# Patient Record
Sex: Male | Born: 1940
Health system: Southern US, Community
[De-identification: ages and names within clinical notes are randomized; demographics above are authoritative.]

## PROBLEM LIST (undated history)

## (undated) DIAGNOSIS — I5032 Chronic diastolic (congestive) heart failure: Secondary | ICD-10-CM

## (undated) DIAGNOSIS — F32A Depression, unspecified: Secondary | ICD-10-CM

## (undated) DIAGNOSIS — F329 Major depressive disorder, single episode, unspecified: Secondary | ICD-10-CM

## (undated) DIAGNOSIS — K219 Gastro-esophageal reflux disease without esophagitis: Secondary | ICD-10-CM

## (undated) DIAGNOSIS — G4730 Sleep apnea, unspecified: Secondary | ICD-10-CM

## (undated) DIAGNOSIS — D72828 Other elevated white blood cell count: Secondary | ICD-10-CM

## (undated) DIAGNOSIS — N400 Enlarged prostate without lower urinary tract symptoms: Secondary | ICD-10-CM

## (undated) DIAGNOSIS — M109 Gout, unspecified: Secondary | ICD-10-CM

## (undated) DIAGNOSIS — D729 Disorder of white blood cells, unspecified: Secondary | ICD-10-CM

## (undated) DIAGNOSIS — N183 Chronic kidney disease, stage 3 unspecified: Secondary | ICD-10-CM

## (undated) DIAGNOSIS — I10 Essential (primary) hypertension: Secondary | ICD-10-CM

## (undated) DIAGNOSIS — G459 Transient cerebral ischemic attack, unspecified: Secondary | ICD-10-CM

## (undated) DIAGNOSIS — D649 Anemia, unspecified: Secondary | ICD-10-CM

## (undated) DIAGNOSIS — E119 Type 2 diabetes mellitus without complications: Secondary | ICD-10-CM

## (undated) DIAGNOSIS — G629 Polyneuropathy, unspecified: Secondary | ICD-10-CM

## (undated) DIAGNOSIS — N200 Calculus of kidney: Secondary | ICD-10-CM

## (undated) DIAGNOSIS — R059 Cough, unspecified: Secondary | ICD-10-CM

## (undated) DIAGNOSIS — F0390 Unspecified dementia without behavioral disturbance: Secondary | ICD-10-CM

## (undated) DIAGNOSIS — R05 Cough: Secondary | ICD-10-CM

## (undated) DIAGNOSIS — M1990 Unspecified osteoarthritis, unspecified site: Secondary | ICD-10-CM

## (undated) DIAGNOSIS — E785 Hyperlipidemia, unspecified: Secondary | ICD-10-CM

## (undated) HISTORY — DX: Other elevated white blood cell count: D72.828

## (undated) HISTORY — DX: Anemia, unspecified: D64.9

## (undated) HISTORY — DX: Disorder of white blood cells, unspecified: D72.9

## (undated) HISTORY — DX: Cough, unspecified: R05.9

## (undated) HISTORY — PX: CATARACT EXTRACTION: SUR2

## (undated) HISTORY — DX: Type 2 diabetes mellitus without complications: E11.9

## (undated) HISTORY — PX: VESICOSTOMY: SHX2661

## (undated) HISTORY — DX: Essential (primary) hypertension: I10

## (undated) HISTORY — DX: Calculus of kidney: N20.0

## (undated) HISTORY — DX: Hyperlipidemia, unspecified: E78.5

## (undated) HISTORY — PX: TONSILLECTOMY: SUR1361

## (undated) HISTORY — PX: SHOULDER SURGERY: SHX246

## (undated) HISTORY — DX: Cough: R05

## (undated) HISTORY — PX: ADENOIDECTOMY: SUR15

---
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
## 2006-07-01 ENCOUNTER — Ambulatory Visit: Payer: Self-pay | Admitting: Internal Medicine

## 2006-07-13 ENCOUNTER — Other Ambulatory Visit: Payer: Self-pay

## 2006-07-16 ENCOUNTER — Ambulatory Visit: Payer: Self-pay | Admitting: Orthopaedic Surgery

## 2006-12-08 ENCOUNTER — Ambulatory Visit: Payer: Self-pay | Admitting: Orthopaedic Surgery

## 2011-02-03 LAB — PULMONARY FUNCTION TEST

## 2011-04-17 ENCOUNTER — Encounter: Payer: Self-pay | Admitting: Critical Care Medicine

## 2011-04-20 ENCOUNTER — Other Ambulatory Visit: Payer: Medicare Other

## 2011-04-20 ENCOUNTER — Ambulatory Visit (INDEPENDENT_AMBULATORY_CARE_PROVIDER_SITE_OTHER): Payer: Medicare Other | Admitting: Critical Care Medicine

## 2011-04-20 ENCOUNTER — Encounter: Payer: Self-pay | Admitting: Critical Care Medicine

## 2011-04-20 DIAGNOSIS — E119 Type 2 diabetes mellitus without complications: Secondary | ICD-10-CM | POA: Insufficient documentation

## 2011-04-20 DIAGNOSIS — R05 Cough: Secondary | ICD-10-CM

## 2011-04-20 DIAGNOSIS — I10 Essential (primary) hypertension: Secondary | ICD-10-CM | POA: Insufficient documentation

## 2011-04-20 DIAGNOSIS — D649 Anemia, unspecified: Secondary | ICD-10-CM | POA: Insufficient documentation

## 2011-04-20 DIAGNOSIS — R0989 Other specified symptoms and signs involving the circulatory and respiratory systems: Secondary | ICD-10-CM

## 2011-04-20 DIAGNOSIS — R0600 Dyspnea, unspecified: Secondary | ICD-10-CM

## 2011-04-20 DIAGNOSIS — E785 Hyperlipidemia, unspecified: Secondary | ICD-10-CM | POA: Insufficient documentation

## 2011-04-20 DIAGNOSIS — J45909 Unspecified asthma, uncomplicated: Secondary | ICD-10-CM | POA: Insufficient documentation

## 2011-04-20 MED ORDER — CICLESONIDE 160 MCG/ACT IN AERS: 1.0000 | INHALATION_SPRAY | Freq: Two times a day (BID) | RESPIRATORY_TRACT | Status: DC

## 2011-04-20 NOTE — Progress Notes (Signed)
Subjective:    Patient ID: Michael Wright, male    DOB: 01-04-1941, 70 y.o.   MRN: [id number]  Cough This is a chronic problem. The current episode started more than 1 year ago (worse over past three months ). The problem has been gradually improving (? if the inhaler is helping, had for one week). The problem occurs every few hours. The cough is non-productive. Associated symptoms include postnasal drip and shortness of breath. Pertinent negatives include no chest pain, chills, ear pain, fever, headaches, heartburn, hemoptysis, myalgias, rash, rhinorrhea, sore throat or wheezing. Associated symptoms comments: Pt was dyspneic, only with exertion, this also is better with the inhaler. The symptoms are aggravated by lying down and exercise. Risk factors for lung disease include occupational exposure (retired from [employer] since 2009,  exposed to paint and dust and shop work). He has tried a beta-agonist inhaler and steroid inhaler for the symptoms. The treatment provided moderate relief. His past medical history is significant for asthma. There is no history of bronchiectasis, bronchitis, COPD, emphysema, environmental allergies or pneumonia.    70 y.o. WM  Referred to eval cough and dyspnea ?asthma  Past Medical History  Diagnosis Date  . Hyperlipidemia   . Anemia   . Hypertension   . Diabetes mellitus, type 2   . Kidney stones   . Cough      Family History  Problem Relation Age of Onset  . Allergies Daughter      History   Social History  . Marital Status: Married    Spouse Name: married    Number of Children: 3  . Years of Education: N/A   Occupational History  . works in a shop at [employer]  . Smoking status: Never Smoker   . Smokeless tobacco: Former Systems developer    Types: [place] date: 08/31/1978  . Alcohol Use: No  . Drug Use: No  . Sexually Active: Not on file   Other Topics Concern  . Not on file   Social History  Narrative  . No narrative on file     No Known Allergies   Outpatient Prescriptions Prior to Visit  Medication Sig Dispense Refill  . allopurinol (ZYLOPRIM) 100 MG tablet Take 100 mg by mouth daily.        . colchicine 0.6 MG tablet Take 0.6 mg by mouth 2 (two) times daily as needed.        Marland Kitchen glimepiride (AMARYL) 1 MG tablet Take 1 mg by mouth 2 (two) times daily.        . metFORMIN (GLUCOPHAGE) 500 MG tablet Take 500 mg by mouth 2 (two) times daily.        Marland Kitchen olmesartan-hydrochlorothiazide (BENICAR HCT) 40-25 MG per tablet Take 1 tablet by mouth daily.        . pioglitazone-metformin (ACTOPLUS MET) 15-850 MG per tablet Take 1 tablet by mouth daily.        . budesonide-formoterol (SYMBICORT) 160-4.5 MCG/ACT inhaler Inhale 2 puffs into the lungs at bedtime.       . indomethacin (INDOCIN) 25 MG capsule Take 25 mg by mouth as directed.        . traMADol (ULTRAM) 50 MG tablet Take 50 mg by mouth as directed.            Review of Systems  Constitutional: Positive for fatigue. Negative for fever, chills, diaphoresis, activity change, appetite change and unexpected weight change.  HENT:  Positive for voice change and postnasal drip. Negative for hearing loss, ear pain, nosebleeds, congestion, sore throat, facial swelling, rhinorrhea, sneezing, mouth sores, trouble swallowing, neck pain, neck stiffness, dental problem, sinus pressure, tinnitus and ear discharge.   Eyes: Positive for discharge. Negative for photophobia, itching and visual disturbance.  Respiratory: Positive for cough and shortness of breath. Negative for apnea, hemoptysis, choking, chest tightness, wheezing and stridor.   Cardiovascular: Negative for chest pain, palpitations and leg swelling.  Gastrointestinal: Negative for heartburn, nausea, vomiting, abdominal pain, constipation, blood in stool and abdominal distention.  Genitourinary: Negative for dysuria, urgency, frequency, hematuria, flank pain, decreased urine volume and  difficulty urinating.  Musculoskeletal: Negative for myalgias, back pain, joint swelling, arthralgias and gait problem.  Skin: Negative for color change, pallor and rash.  Neurological: Negative for dizziness, tremors, seizures, syncope, speech difficulty, weakness, light-headedness, numbness and headaches.  Hematological: Negative for environmental allergies and adenopathy. Does not bruise/bleed easily.  Psychiatric/Behavioral: Negative for confusion, sleep disturbance and agitation. The patient is not nervous/anxious.        Objective:   Physical Exam  Filed Vitals:   04/20/11 1607  BP: 120/70  Pulse: 94  Temp: 97.9 F (36.6 C)  TempSrc: Oral  Height: 5\' 7"  (1.702 m)  Weight: 244 lb 6.4 oz (110.859 kg)  SpO2: 95%    Gen: Pleasant, well-nourished, in no distress,  normal affect  ENT: No lesions,  mouth clear,  oropharynx clear, no postnasal drip  Neck: No JVD, no TMG, no carotid bruits  Lungs: No use of accessory muscles, no dullness to percussion,distant BS  Cardiovascular: RRR, heart sounds normal, no murmur or gallops, no peripheral edema  Abdomen: soft and NT, no HSM,  BS normal  Musculoskeletal: No deformities, no cyanosis or clubbing  Neuro: alert, non focal  Skin: Warm, no lesions or rashes       Assessment & Plan:   Cough Cyclical cough suspect is d/t lower airway inflammation.  Doubt GERD mediated, suspect lower airways disease Note pulmonary functions from prior pulmonary eval with normal spirometry and lung volumes Fev1 86% TLC 80%  DLCO 72% 02/03/11 CXR 02/08/11: NAD  Plan When current symbicort runs out stop and then start Alvesco one puff twice daily Stop astepro No other medication changes Return 2 months  Labs today An overnight sleep oximetry will be obtained    Check IgE and full allergy profile   Updated Medication List Outpatient Encounter Prescriptions as of 04/20/2011  Medication Sig Dispense Refill  . allopurinol (ZYLOPRIM) 100  MG tablet Take 100 mg by mouth daily.        Marland Kitchen aspirin 81 MG tablet Take 81 mg by mouth daily.        . Azelastine HCl (ASTEPRO) 0.15 % SOLN Place 2 puffs into the nose 1 day or 1 dose.        . ciclesonide (ALVESCO) 160 MCG/ACT inhaler Inhale 1 puff into the lungs 2 (two) times daily.  1 Inhaler  6  . colchicine 0.6 MG tablet Take 0.6 mg by mouth 2 (two) times daily as needed.        Marland Kitchen glimepiride (AMARYL) 1 MG tablet Take 1 mg by mouth 2 (two) times daily.        . metFORMIN (GLUCOPHAGE) 500 MG tablet Take 500 mg by mouth 2 (two) times daily.        . mometasone (NASONEX) 50 MCG/ACT nasal spray Place 2 sprays into the nose at bedtime.        Marland Kitchen  olmesartan-hydrochlorothiazide (BENICAR HCT) 40-25 MG per tablet Take 1 tablet by mouth daily.        . pioglitazone-metformin (ACTOPLUS MET) 15-850 MG per tablet Take 1 tablet by mouth daily.        . pravastatin (PRAVACHOL) 40 MG tablet Take 1 tablet by mouth Daily.      Marland Kitchen DISCONTD: budesonide-formoterol (SYMBICORT) 160-4.5 MCG/ACT inhaler Inhale 2 puffs into the lungs at bedtime.       Marland Kitchen DISCONTD: indomethacin (INDOCIN) 25 MG capsule Take 25 mg by mouth as directed.        Marland Kitchen DISCONTD: traMADol (ULTRAM) 50 MG tablet Take 50 mg by mouth as directed.

## 2011-04-20 NOTE — Patient Instructions (Addendum)
When current symbicort runs out stop and then start Alvesco one puff twice daily Stop astepro No other medication changes Return 2 months  Labs today An overnight sleep oximetry will be obtained

## 2011-04-21 LAB — ALLERGY FULL PROFILE
Allergen, D pternoyssinus,d7: 10.5 kU/L — ABNORMAL HIGH (ref <0.35)
Aspergillus fumigatus, IgG: <0.1 kU/L (ref <0.35)
Bermuda Grass: <0.1 kU/L (ref <0.35)
Box Elder IgE: <0.1 kU/L (ref <0.35)
Candida Albicans: 0.35 kU/L — ABNORMAL HIGH (ref <0.35)
D. farinae: 11 kU/L — ABNORMAL HIGH (ref <0.35)
Elm IgE: <0.1 kU/L (ref <0.35)
G005 Rye, Perennial: <0.1 kU/L (ref <0.35)
G009 Red Top: <0.1 kU/L (ref <0.35)
IgE (Immunoglobulin E), Serum: 113.1 IU/mL (ref 0.0–180.0)
Oak: <0.1 kU/L (ref <0.35)
Plantain: <0.1 kU/L (ref <0.35)
Stemphylium Botryosum: <0.1 kU/L (ref <0.35)
Timothy Grass: <0.1 kU/L (ref <0.35)

## 2011-04-21 NOTE — Assessment & Plan Note (Signed)
Cyclical cough suspect is d/t lower airway inflammation.  Doubt GERD mediated, suspect lower airways disease Note pulmonary functions from prior pulmonary eval with normal spirometry and lung volumes Fev1 86% TLC 80%  DLCO 72% 02/03/11 CXR 02/08/11: NAD  Plan When current symbicort runs out stop and then start Alvesco one puff twice daily Stop astepro No other medication changes Return 2 months  Labs today An overnight sleep oximetry will be obtained

## 2011-04-22 ENCOUNTER — Encounter: Payer: Self-pay | Admitting: Critical Care Medicine

## 2011-04-24 NOTE — Progress Notes (Signed)
Quick Note:  Called, spoke with pt. He is aware allergy test pos for cat dander, dog dander, house dust, and ragweed per PW. He is aware no change to meds. He verbalized understanding of these results and recs and voiced no further questions/concerns at this time. ______

## 2011-04-29 ENCOUNTER — Ambulatory Visit: Payer: Self-pay | Admitting: Family Medicine

## 2011-04-30 ENCOUNTER — Telehealth: Payer: Self-pay | Admitting: Critical Care Medicine

## 2011-04-30 DIAGNOSIS — J441 Chronic obstructive pulmonary disease with (acute) exacerbation: Secondary | ICD-10-CM

## 2011-04-30 NOTE — Telephone Encounter (Signed)
ONO shows desats to 61% .  Pt will need oxygen 2L [facility] only Call pt and tell him this and order oxygen

## 2011-04-30 NOTE — Telephone Encounter (Signed)
Order faxed to [facility] for pt to start home oxygen at 2 lpm at qhs only. Lecretia p/u original order. [facility] to contact patient and deliver o2 today. [place] and spoke with pt's wife, (pt was not home at the time) gave results of ono to pt's wife and explained that [facility] would be getting in contact with them to arrange delivery of oxygen to be used at bedtime only at 2 lpm. Advised that if patient had any questions, to contact me at [phone].

## 2011-06-30 ENCOUNTER — Encounter: Payer: Self-pay | Admitting: Critical Care Medicine

## 2011-06-30 ENCOUNTER — Ambulatory Visit (INDEPENDENT_AMBULATORY_CARE_PROVIDER_SITE_OTHER): Payer: Medicare Other | Admitting: Critical Care Medicine

## 2011-06-30 VITALS — BP 106/58 | HR 106 | Temp 98.1°F | Ht 67.5 in | Wt 244.6 lb

## 2011-06-30 DIAGNOSIS — R05 Cough: Secondary | ICD-10-CM

## 2011-06-30 DIAGNOSIS — Z23 Encounter for immunization: Secondary | ICD-10-CM

## 2011-06-30 NOTE — Patient Instructions (Signed)
Follow a reflux diet Stay on Alvesco two puff daily Stay on oxygen at night Flu vaccine and pneumovax was given Return 4 months

## 2011-06-30 NOTE — Progress Notes (Signed)
Subjective:    Patient ID: Michael Wright, male    DOB: Nov 06, 1940, 70 y.o.   MRN: [id number]  Cough This is a chronic problem. The current episode started more than 1 year ago (worse over past three months ). The problem has been gradually improving (? if the inhaler is helping, had for one week). The problem occurs every few hours. The cough is non-productive. Associated symptoms include postnasal drip and shortness of breath. Pertinent negatives include no chest pain, chills, ear pain, fever, headaches, heartburn, hemoptysis, myalgias, rash, rhinorrhea, sore throat or wheezing. Associated symptoms comments: Pt was dyspneic, only with exertion, this also is better with the inhaler. The symptoms are aggravated by lying down and exercise. Risk factors for lung disease include occupational exposure (retired from [employer] since 2009,  exposed to paint and dust and shop work). He has tried a beta-agonist inhaler and steroid inhaler for the symptoms. The treatment provided moderate relief. His past medical history is significant for asthma. There is no history of bronchiectasis, bronchitis, COPD, emphysema, environmental allergies or pneumonia.    70 y.o. WM  Referred to eval cough and dyspnea ?asthma  10/30 Does not sleep well,  Wakes up drowsy.  Not forgetful.  ? If snores.  Desat on ONO to 61% Never had a sleep study.   Has nocturia Cough is better now  Past Medical History  Diagnosis Date  . Hyperlipidemia   . Anemia   . Hypertension   . Diabetes mellitus, type 2   . Kidney stones   . Cough      Family History  Problem Relation Age of Onset  . Allergies Daughter      History   Social History  . Marital Status: Married    Spouse Name: married    Number of Children: 3  . Years of Education: N/A   Occupational History  . works in a shop at [employer]  . Smoking status: Never Smoker   . Smokeless tobacco: Former Systems developer    Types: [place] date: 08/31/1978  . Alcohol Use: No  . Drug Use: No  . Sexually Active: Not on file   Other Topics Concern  . Not on file   Social History Narrative  . No narrative on file     No Known Allergies   Outpatient Prescriptions Prior to Visit  Medication Sig Dispense Refill  . allopurinol (ZYLOPRIM) 100 MG tablet Take 100 mg by mouth daily.        Marland Kitchen aspirin 81 MG tablet Take 81 mg by mouth daily.        . Azelastine HCl (ASTEPRO) 0.15 % SOLN Place 2 puffs into the nose 1 day or 1 dose.        . colchicine 0.6 MG tablet Take 0.6 mg by mouth 2 (two) times daily as needed.        Marland Kitchen glimepiride (AMARYL) 1 MG tablet Take 1 mg by mouth 2 (two) times daily.        . metFORMIN (GLUCOPHAGE) 500 MG tablet Take 500 mg by mouth 2 (two) times daily.        . mometasone (NASONEX) 50 MCG/ACT nasal spray Place 2 sprays into the nose at bedtime.        Marland Kitchen olmesartan-hydrochlorothiazide (BENICAR HCT) 40-25 MG per tablet Take 1 tablet by mouth daily.        . pioglitazone-metformin (ACTOPLUS MET) 15-850 MG per  tablet Take 1 tablet by mouth daily.        . pravastatin (PRAVACHOL) 40 MG tablet Take 1 tablet by mouth Daily.      . ciclesonide (ALVESCO) 160 MCG/ACT inhaler Inhale 1 puff into the lungs 2 (two) times daily.  1 Inhaler  6      Review of Systems  Constitutional: Positive for fatigue. Negative for fever, chills, diaphoresis, activity change, appetite change and unexpected weight change.  HENT: Positive for voice change and postnasal drip. Negative for hearing loss, ear pain, nosebleeds, congestion, sore throat, facial swelling, rhinorrhea, sneezing, mouth sores, trouble swallowing, neck pain, neck stiffness, dental problem, sinus pressure, tinnitus and ear discharge.   Eyes: Positive for discharge. Negative for photophobia, itching and visual disturbance.  Respiratory: Positive for cough and shortness of breath. Negative for apnea, hemoptysis, choking, chest tightness, wheezing and  stridor.   Cardiovascular: Negative for chest pain, palpitations and leg swelling.  Gastrointestinal: Negative for heartburn, nausea, vomiting, abdominal pain, constipation, blood in stool and abdominal distention.  Genitourinary: Negative for dysuria, urgency, frequency, hematuria, flank pain, decreased urine volume and difficulty urinating.  Musculoskeletal: Negative for myalgias, back pain, joint swelling, arthralgias and gait problem.  Skin: Negative for color change, pallor and rash.  Neurological: Negative for dizziness, tremors, seizures, syncope, speech difficulty, weakness, light-headedness, numbness and headaches.  Hematological: Negative for environmental allergies and adenopathy. Does not bruise/bleed easily.  Psychiatric/Behavioral: Negative for confusion, sleep disturbance and agitation. The patient is not nervous/anxious.        Objective:   Physical Exam   Filed Vitals:   06/30/11 1455  BP: 106/58  Pulse: 106  Temp: 98.1 F (36.7 C)  TempSrc: Oral  Height: 5' 7.5" (1.715 m)  Weight: 244 lb 9.6 oz (110.95 kg)  SpO2: 95%    Gen: Pleasant, well-nourished, in no distress,  normal affect  ENT: No lesions,  mouth clear,  oropharynx clear, no postnasal drip  Neck: No JVD, no TMG, no carotid bruits  Lungs: No use of accessory muscles, no dullness to percussion,distant BS  Cardiovascular: RRR, heart sounds normal, no murmur or gallops, no peripheral edema  Abdomen: soft and NT, no HSM,  BS normal  Musculoskeletal: No deformities, no cyanosis or clubbing  Neuro: alert, non focal  Skin: Warm, no lesions or rashes       Assessment & Plan:   Extrinsic asthma, unspecified Cyclical cough  is d/t lower airway inflammation.  Doubt GERD mediated, suspect lower airways disease Note pulmonary functions from prior pulmonary eval with normal spirometry and lung volumes Fev1 86% TLC 80%  DLCO 72% 02/03/11 ONO RA 8/12:  61%  O2 started  Overall cough is now improved with  treatment of lower airway inflammation with inhaled steroid Plan Continue alvesco Continue reflux diet Flu vaccine and Pneumovax given    Check IgE and full allergy profile   Updated Medication List Outpatient Encounter Prescriptions as of 06/30/2011  Medication Sig Dispense Refill  . allopurinol (ZYLOPRIM) 100 MG tablet Take 100 mg by mouth daily.        Marland Kitchen aspirin 81 MG tablet Take 81 mg by mouth daily.        . Azelastine HCl (ASTEPRO) 0.15 % SOLN Place 2 puffs into the nose 1 day or 1 dose.        . ciclesonide (ALVESCO) 160 MCG/ACT inhaler Inhale 2 puffs into the lungs at bedtime.        . colchicine 0.6 MG tablet Take 0.6 mg  by mouth 2 (two) times daily as needed.        Marland Kitchen glimepiride (AMARYL) 1 MG tablet Take 1 mg by mouth 2 (two) times daily.        . metFORMIN (GLUCOPHAGE) 500 MG tablet Take 500 mg by mouth 2 (two) times daily.        . mometasone (NASONEX) 50 MCG/ACT nasal spray Place 2 sprays into the nose at bedtime.        Marland Kitchen olmesartan-hydrochlorothiazide (BENICAR HCT) 40-25 MG per tablet Take 1 tablet by mouth daily.        . pioglitazone-metformin (ACTOPLUS MET) 15-850 MG per tablet Take 1 tablet by mouth daily.        . pravastatin (PRAVACHOL) 40 MG tablet Take 1 tablet by mouth Daily.      Marland Kitchen DISCONTD: ciclesonide (ALVESCO) 160 MCG/ACT inhaler Inhale 1 puff into the lungs 2 (two) times daily.  1 Inhaler  6

## 2011-07-01 NOTE — Assessment & Plan Note (Signed)
Cyclical cough  is d/t lower airway inflammation.  Doubt GERD mediated, suspect lower airways disease Note pulmonary functions from prior pulmonary eval with normal spirometry and lung volumes Fev1 86% TLC 80%  DLCO 72% 02/03/11 ONO RA 8/12:  61%  O2 started  Overall cough is now improved with treatment of lower airway inflammation with inhaled steroid Plan Continue alvesco Continue reflux diet Flu vaccine and Pneumovax given

## 2013-03-20 ENCOUNTER — Ambulatory Visit: Payer: Self-pay | Admitting: Unknown Physician Specialty

## 2013-03-21 LAB — PATHOLOGY REPORT

## 2014-08-31 DIAGNOSIS — G459 Transient cerebral ischemic attack, unspecified: Secondary | ICD-10-CM

## 2014-08-31 HISTORY — DX: Transient cerebral ischemic attack, unspecified: G45.9

## 2014-10-22 DIAGNOSIS — Z0000 Encounter for general adult medical examination without abnormal findings: Secondary | ICD-10-CM | POA: Diagnosis not present

## 2014-10-29 DIAGNOSIS — E785 Hyperlipidemia, unspecified: Secondary | ICD-10-CM | POA: Diagnosis not present

## 2014-10-29 DIAGNOSIS — I10 Essential (primary) hypertension: Secondary | ICD-10-CM | POA: Diagnosis not present

## 2014-10-29 DIAGNOSIS — E119 Type 2 diabetes mellitus without complications: Secondary | ICD-10-CM | POA: Diagnosis not present

## 2014-10-29 DIAGNOSIS — Z0000 Encounter for general adult medical examination without abnormal findings: Secondary | ICD-10-CM | POA: Diagnosis not present

## 2015-02-22 DIAGNOSIS — E119 Type 2 diabetes mellitus without complications: Secondary | ICD-10-CM | POA: Diagnosis not present

## 2015-02-28 DIAGNOSIS — E119 Type 2 diabetes mellitus without complications: Secondary | ICD-10-CM | POA: Diagnosis not present

## 2015-02-28 DIAGNOSIS — R5381 Other malaise: Secondary | ICD-10-CM | POA: Diagnosis not present

## 2015-02-28 DIAGNOSIS — L299 Pruritus, unspecified: Secondary | ICD-10-CM | POA: Diagnosis not present

## 2015-02-28 DIAGNOSIS — I10 Essential (primary) hypertension: Secondary | ICD-10-CM | POA: Diagnosis not present

## 2015-04-02 DIAGNOSIS — R350 Frequency of micturition: Secondary | ICD-10-CM | POA: Diagnosis not present

## 2015-04-02 DIAGNOSIS — Z8639 Personal history of other endocrine, nutritional and metabolic disease: Secondary | ICD-10-CM | POA: Diagnosis not present

## 2015-04-02 DIAGNOSIS — M545 Low back pain: Secondary | ICD-10-CM | POA: Diagnosis not present

## 2015-04-02 DIAGNOSIS — L299 Pruritus, unspecified: Secondary | ICD-10-CM | POA: Diagnosis not present

## 2015-05-15 ENCOUNTER — Emergency Department: Payer: Commercial Managed Care - HMO

## 2015-05-15 ENCOUNTER — Inpatient Hospital Stay
Admission: EM | Admit: 2015-05-15 | Discharge: 2015-05-16 | DRG: 069 | Disposition: A | Discharge status: Home / Self Care | Payer: Commercial Managed Care - HMO | Attending: Internal Medicine | Admitting: Internal Medicine

## 2015-05-15 ENCOUNTER — Inpatient Hospital Stay: Payer: Commercial Managed Care - HMO

## 2015-05-15 ENCOUNTER — Encounter: Payer: Self-pay | Admitting: *Deleted

## 2015-05-15 ENCOUNTER — Other Ambulatory Visit: Payer: Self-pay

## 2015-05-15 DIAGNOSIS — Z7982 Long term (current) use of aspirin: Secondary | ICD-10-CM

## 2015-05-15 DIAGNOSIS — N400 Enlarged prostate without lower urinary tract symptoms: Secondary | ICD-10-CM | POA: Diagnosis present

## 2015-05-15 DIAGNOSIS — K760 Fatty (change of) liver, not elsewhere classified: Secondary | ICD-10-CM | POA: Diagnosis not present

## 2015-05-15 DIAGNOSIS — G8929 Other chronic pain: Secondary | ICD-10-CM | POA: Diagnosis not present

## 2015-05-15 DIAGNOSIS — Z79899 Other long term (current) drug therapy: Secondary | ICD-10-CM

## 2015-05-15 DIAGNOSIS — E119 Type 2 diabetes mellitus without complications: Secondary | ICD-10-CM | POA: Diagnosis not present

## 2015-05-15 DIAGNOSIS — E781 Pure hyperglyceridemia: Secondary | ICD-10-CM | POA: Diagnosis present

## 2015-05-15 DIAGNOSIS — R1011 Right upper quadrant pain: Secondary | ICD-10-CM | POA: Diagnosis not present

## 2015-05-15 DIAGNOSIS — E785 Hyperlipidemia, unspecified: Secondary | ICD-10-CM | POA: Diagnosis not present

## 2015-05-15 DIAGNOSIS — I6350 Cerebral infarction due to unspecified occlusion or stenosis of unspecified cerebral artery: Secondary | ICD-10-CM | POA: Diagnosis not present

## 2015-05-15 DIAGNOSIS — I638 Other cerebral infarction: Secondary | ICD-10-CM | POA: Diagnosis not present

## 2015-05-15 DIAGNOSIS — N179 Acute kidney failure, unspecified: Secondary | ICD-10-CM | POA: Diagnosis present

## 2015-05-15 DIAGNOSIS — R109 Unspecified abdominal pain: Secondary | ICD-10-CM

## 2015-05-15 DIAGNOSIS — R531 Weakness: Secondary | ICD-10-CM | POA: Diagnosis not present

## 2015-05-15 DIAGNOSIS — Z87891 Personal history of nicotine dependence: Secondary | ICD-10-CM

## 2015-05-15 DIAGNOSIS — J45909 Unspecified asthma, uncomplicated: Secondary | ICD-10-CM | POA: Diagnosis present

## 2015-05-15 DIAGNOSIS — I639 Cerebral infarction, unspecified: Secondary | ICD-10-CM | POA: Diagnosis present

## 2015-05-15 DIAGNOSIS — I10 Essential (primary) hypertension: Secondary | ICD-10-CM | POA: Diagnosis not present

## 2015-05-15 DIAGNOSIS — G459 Transient cerebral ischemic attack, unspecified: Secondary | ICD-10-CM | POA: Diagnosis not present

## 2015-05-15 DIAGNOSIS — R079 Chest pain, unspecified: Secondary | ICD-10-CM | POA: Diagnosis not present

## 2015-05-15 DIAGNOSIS — R4182 Altered mental status, unspecified: Secondary | ICD-10-CM | POA: Diagnosis not present

## 2015-05-15 DIAGNOSIS — F40240 Claustrophobia: Secondary | ICD-10-CM | POA: Diagnosis present

## 2015-05-15 HISTORY — DX: Benign prostatic hyperplasia without lower urinary tract symptoms: N40.0

## 2015-05-15 HISTORY — DX: Gout, unspecified: M10.9

## 2015-05-15 LAB — CBC
HEMATOCRIT: 40.4 % (ref 40.0–52.0)
HEMOGLOBIN: 12.8 g/dL — AB (ref 13.0–18.0)
MCH: 28.8 pg (ref 26.0–34.0)
MCHC: 31.8 g/dL — ABNORMAL LOW (ref 32.0–36.0)
MCV: 90.6 fL (ref 80.0–100.0)
PLATELETS: 232 10*3/uL (ref 150–440)
RBC: 4.46 MIL/uL (ref 4.40–5.90)
RDW: 13.8 % (ref 11.5–14.5)
WBC: 10.9 10*3/uL — AB (ref 3.8–10.6)

## 2015-05-15 LAB — COMPREHENSIVE METABOLIC PANEL
ALT: 23 U/L (ref 17–63)
AST: 34 U/L (ref 15–41)
Albumin: 4.1 g/dL (ref 3.5–5.0)
Alkaline Phosphatase: 47 U/L (ref 38–126)
Anion gap: 11 (ref 5–15)
BUN: 28 mg/dL — ABNORMAL HIGH (ref 6–20)
CHLORIDE: 101 mmol/L (ref 101–111)
CO2: 29 mmol/L (ref 22–32)
Calcium: 8.9 mg/dL (ref 8.9–10.3)
Creatinine, Ser: 1.42 mg/dL — ABNORMAL HIGH (ref 0.61–1.24)
GFR calc Af Amer: 55 mL/min — ABNORMAL LOW (ref >60)
GFR calc non Af Amer: 47 mL/min — ABNORMAL LOW (ref >60)
Glucose, Bld: 144 mg/dL — ABNORMAL HIGH (ref 65–99)
Potassium: 3.1 mmol/L — ABNORMAL LOW (ref 3.5–5.1)
Sodium: 141 mmol/L (ref 135–145)
Total Bilirubin: 1.2 mg/dL (ref 0.3–1.2)
Total Protein: 7.6 g/dL (ref 6.5–8.1)

## 2015-05-15 LAB — URINALYSIS COMPLETE WITH MICROSCOPIC (ARMC ONLY)
BACTERIA UA: NONE SEEN
Bilirubin Urine: NEGATIVE
Glucose, UA: NEGATIVE mg/dL
Hgb urine dipstick: NEGATIVE
Ketones, ur: NEGATIVE mg/dL
Leukocytes, UA: NEGATIVE
NITRITE: NEGATIVE
PROTEIN: NEGATIVE mg/dL
SPECIFIC GRAVITY, URINE: 1.017 (ref 1.005–1.030)
SQUAMOUS EPITHELIAL / LPF: NONE SEEN
pH: 5 (ref 5.0–8.0)

## 2015-05-15 LAB — TROPONIN I

## 2015-05-15 MED ORDER — ONDANSETRON HCL 4 MG PO TABS: 4.0000 mg | ORAL_TABLET | Freq: Four times a day (QID) | ORAL | Status: DC | PRN

## 2015-05-15 MED ORDER — SODIUM CHLORIDE 0.9 % IJ SOLN
3.0000 mL | Freq: Two times a day (BID) | INTRAMUSCULAR | Status: DC
  Administered 2015-05-16: 3 mL via INTRAVENOUS

## 2015-05-15 MED ORDER — INSULIN ASPART 100 UNIT/ML ~~LOC~~ SOLN: 0.0000 [IU] | Freq: Every day | SUBCUTANEOUS | Status: DC

## 2015-05-15 MED ORDER — ONDANSETRON HCL 4 MG/2ML IJ SOLN: 4.0000 mg | Freq: Four times a day (QID) | INTRAMUSCULAR | Status: DC | PRN

## 2015-05-15 MED ORDER — ACETAMINOPHEN 650 MG RE SUPP: 650.0000 mg | Freq: Four times a day (QID) | RECTAL | Status: DC | PRN

## 2015-05-15 MED ORDER — METFORMIN HCL 500 MG PO TABS
500.0000 mg | ORAL_TABLET | Freq: Two times a day (BID) | ORAL | Status: DC
  Administered 2015-05-16: 500 mg via ORAL
  Filled 2015-05-15: qty 1

## 2015-05-15 MED ORDER — INSULIN ASPART 100 UNIT/ML ~~LOC~~ SOLN
0.0000 [IU] | Freq: Three times a day (TID) | SUBCUTANEOUS | Status: DC
  Administered 2015-05-16: 12:00:00 3 [IU] via SUBCUTANEOUS
  Filled 2015-05-15: qty 3

## 2015-05-15 MED ORDER — ENOXAPARIN SODIUM 40 MG/0.4ML ~~LOC~~ SOLN
40.0000 mg | Freq: Every day | SUBCUTANEOUS | Status: DC
  Administered 2015-05-16: 40 mg via SUBCUTANEOUS
  Filled 2015-05-15: qty 0.4

## 2015-05-15 MED ORDER — STROKE: EARLY STAGES OF RECOVERY BOOK
Freq: Once | Status: AC
  Administered 2015-05-16: 01:00:00

## 2015-05-15 MED ORDER — ALLOPURINOL 100 MG PO TABS
100.0000 mg | ORAL_TABLET | Freq: Every day | ORAL | Status: DC
  Administered 2015-05-16: 100 mg via ORAL
  Filled 2015-05-15: qty 1

## 2015-05-15 MED ORDER — FINASTERIDE 5 MG PO TABS
5.0000 mg | ORAL_TABLET | Freq: Every day | ORAL | Status: DC
  Administered 2015-05-16: 12:00:00 5 mg via ORAL
  Filled 2015-05-15: qty 1

## 2015-05-15 MED ORDER — PRAVASTATIN SODIUM 40 MG PO TABS
40.0000 mg | ORAL_TABLET | Freq: Every day | ORAL | Status: DC
Start: 2015-05-16 — End: 2015-05-16
  Administered 2015-05-16: 40 mg via ORAL
  Filled 2015-05-15 (×2): qty 1

## 2015-05-15 MED ORDER — ASPIRIN 81 MG PO CHEW
324.0000 mg | CHEWABLE_TABLET | Freq: Once | ORAL | Status: AC
  Administered 2015-05-15: 324 mg via ORAL
  Filled 2015-05-15: qty 4

## 2015-05-15 MED ORDER — SENNOSIDES-DOCUSATE SODIUM 8.6-50 MG PO TABS: 1.0000 | ORAL_TABLET | Freq: Every evening | ORAL | Status: DC | PRN

## 2015-05-15 MED ORDER — HYDROCODONE-ACETAMINOPHEN 5-325 MG PO TABS: 1.0000 | ORAL_TABLET | ORAL | Status: DC | PRN

## 2015-05-15 MED ORDER — ACETAMINOPHEN 325 MG PO TABS: 650.0000 mg | ORAL_TABLET | Freq: Four times a day (QID) | ORAL | Status: DC | PRN

## 2015-05-15 NOTE — ED Notes (Signed)
Pt to CT with RN

## 2015-05-15 NOTE — ED Notes (Signed)
Pt reports waking up from a nap this afternoon and could not remember anything.  Pt denies headache.  Pt alert.

## 2015-05-15 NOTE — ED Notes (Signed)
Pt back from CT with RN

## 2015-05-15 NOTE — ED Notes (Signed)
2 family members at bedside. 

## 2015-05-15 NOTE — H&P (Signed)
[facility] [place] NAME: Michael Wright    MR#:  [id number]  DATE OF BIRTH:  15-Nov-1940  DATE OF ADMISSION:  05/15/2015  PRIMARY CARE PHYSICIAN: Maryland Pink, MD   REQUESTING/REFERRING PHYSICIAN: Kerman Passey, MD  CHIEF COMPLAINT:   Chief Complaint  Patient presents with  . Weakness    HISTORY OF PRESENT ILLNESS:  Michael Wright  is a 74 y.o. male who presents with an episode of expressive aphasia. Patient states that he was at home, and sometime around 3:00 in the afternoon was talk with his wife, and suddenly had difficulty expressing what he wanted to say. Patient states that he knew in his mind what he wanted to say, but recognizes he was having difficulty getting the words out. He's never had anything like this before. Patient's wife insisted he come to the ED reevaluated. Here in the ED his symptoms have largely resolved. TPA was not given for the same, and also due to an exact time of onset of symptoms. Initial workup in the ED was largely normal. Hospitalists were called for admission for stroke/TIA. Of note, patient also complains of chronic right upper quadrant abdominal pain. States that the pain is constant, waxing and waning in intensity, does not identify any alleviating or aggravating factors. The pain does not radiate, or move.    PAST MEDICAL HISTORY:   Past Medical History  Diagnosis Date  . Hyperlipidemia   . Anemia   . Hypertension   . Diabetes mellitus, type 2   . Kidney stones   . Cough   . BPH (benign prostatic hyperplasia)   . Gout     PAST SURGICAL HISTORY:   Past Surgical History  Procedure Laterality Date  . Tonsillectomy    . Adenoidectomy  at age 64  . Vesicostomy    . Shoulder surgery      SOCIAL HISTORY:   Social History  Substance Use Topics  . Smoking status: Never Smoker   . Smokeless tobacco: Former Systems developer    Types: [place] date: 08/31/1978  . Alcohol Use: No     FAMILY HISTORY:   Family History  Problem Relation Age of Onset  . Allergies Daughter   . Diabetes Father     DRUG ALLERGIES:  No Known Allergies  MEDICATIONS AT HOME:   Prior to Admission medications   Medication Sig Start Date End Date Taking? Authorizing Provider  allopurinol (ZYLOPRIM) 100 MG tablet Take 100 mg by mouth daily.     Yes Historical Provider, MD  colchicine 0.6 MG tablet Take 0.6 mg by mouth 2 (two) times daily as needed (for gout flares).   Yes Historical Provider, MD  finasteride (PROSCAR) 5 MG tablet Take 5 mg by mouth daily.   Yes Historical Provider, MD  glimepiride (AMARYL) 2 MG tablet Take 2 mg by mouth 2 (two) times daily.   Yes Historical Provider, MD  losartan-hydrochlorothiazide (HYZAAR) 100-25 MG per tablet Take 1 tablet by mouth daily.   Yes Historical Provider, MD  metFORMIN (GLUCOPHAGE) 500 MG tablet Take 500 mg by mouth 2 (two) times daily with a meal.    Yes Historical Provider, MD  metFORMIN (GLUCOPHAGE) 850 MG tablet Take 850 mg by mouth daily with breakfast.   Yes Historical Provider, MD  pioglitazone (ACTOS) 15 MG tablet Take 15 mg by mouth daily.   Yes Historical Provider, MD  pravastatin (PRAVACHOL) 40 MG tablet Take 40 mg by mouth at bedtime.  Yes Historical Provider, MD    REVIEW OF SYSTEMS:  Review of Systems  Constitutional: Negative for fever, chills, weight loss and malaise/fatigue.  HENT: Negative for ear pain, hearing loss and tinnitus.   Eyes: Negative for blurred vision, double vision, pain and redness.  Respiratory: Negative for cough, hemoptysis and shortness of breath.   Cardiovascular: Negative for chest pain, palpitations, orthopnea and leg swelling.  Gastrointestinal: Negative for nausea, vomiting, abdominal pain, diarrhea and constipation.  Genitourinary: Negative for dysuria, frequency and hematuria.  Musculoskeletal: Negative for back pain, joint pain and neck pain.  Skin:       No acne, rash, or lesions   Neurological: Positive for speech change. Negative for dizziness, tremors, sensory change, focal weakness, seizures, loss of consciousness and weakness.  Endo/Heme/Allergies: Negative for polydipsia. Does not bruise/bleed easily.  Psychiatric/Behavioral: Negative for depression. The patient is not nervous/anxious and does not have insomnia.      VITAL SIGNS:   Filed Vitals:   05/15/15 2143 05/15/15 2200 05/15/15 2230 05/15/15 2300  BP:  144/80 126/90 139/76  Pulse:  91 90 94  Temp: 98.4 F (36.9 C)     TempSrc:      Resp:  17 17 17   Height:      Weight:      SpO2:  95% 96% 94%   Wt Readings from Last 3 Encounters:  05/15/15 108.863 kg (240 lb)  06/30/11 110.95 kg (244 lb 9.6 oz)  04/20/11 110.859 kg (244 lb 6.4 oz)    PHYSICAL EXAMINATION:  Physical Exam  Vitals reviewed. Constitutional: He is oriented to person, place, and time. He appears well-developed and well-nourished. No distress.  HENT:  Head: Normocephalic and atraumatic.  Mouth/Throat: Oropharynx is clear and moist.  Eyes: Conjunctivae and EOM are normal. Pupils are equal, round, and reactive to light. No scleral icterus.  Neck: Normal range of motion. Neck supple. No JVD present. No thyromegaly present.  Cardiovascular: Normal rate, regular rhythm and intact distal pulses.  Exam reveals no gallop and no friction rub.   No murmur heard. Respiratory: Effort normal and breath sounds normal. No respiratory distress. He has no wheezes. He has no rales.  GI: Soft. Bowel sounds are normal. He exhibits no distension. There is no tenderness.  Musculoskeletal: Normal range of motion. He exhibits no edema.  No arthritis, no acute gout  Lymphadenopathy:    He has no cervical adenopathy.  Neurological: He is alert and oriented to person, place, and time.  Neurologic: Cranial nerves II-XII intact, Sensation intact to light touch/pinprick, 5/5 strength in all extremities, no dysarthria, no aphasia, no dysphagia, memory  intact, finger to nose testing showed no abnormality, no pronator drift, DTR intact, Babinski sign not present.   Skin: Skin is warm and dry. No rash noted. No erythema.  Psychiatric: He has a normal mood and affect. His behavior is normal. Judgment and thought content normal.    LABORATORY PANEL:   CBC  Recent Labs Lab 05/15/15 2012  WBC 10.9*  HGB 12.8*  HCT 40.4  PLT 232   ------------------------------------------------------------------------------------------------------------------  Chemistries   Recent Labs Lab 05/15/15 2012  NA 141  K 3.1*  CL 101  CO2 29  GLUCOSE 144*  BUN 28*  CREATININE 1.42*  CALCIUM 8.9  AST 34  ALT 23  ALKPHOS 47  BILITOT 1.2   ------------------------------------------------------------------------------------------------------------------  Cardiac Enzymes  Recent Labs Lab 05/15/15 2012  TROPONINI <0.03   ------------------------------------------------------------------------------------------------------------------  RADIOLOGY:  Ct Head Wo Contrast  05/15/2015  CLINICAL DATA:  Acute mental status changes.  EXAM: CT HEAD WITHOUT CONTRAST  TECHNIQUE: Contiguous axial images were obtained from the base of the skull through the vertex without intravenous contrast.  COMPARISON:  None.  FINDINGS: The ventricles are normal in size and configuration. No extra-axial fluid collections are identified. The gray-white differentiation is normal. Incidental cavum septum pellucidum. No CT findings for acute intracranial process such as hemorrhage or infarction. No mass lesions. The brainstem and cerebellum are grossly normal.  The bony structures are intact. The paranasal sinuses and mastoid air cells are clear. The globes are intact.  IMPRESSION: No CT findings for acute intracranial process.   Electronically Signed   By: Marijo Sanes M.D.   On: 05/15/2015 20:37    EKG:   Orders placed or performed during the hospital encounter of 05/15/15   . ED EKG  . ED EKG    IMPRESSION AND PLAN:  Principal Problem:   Stroke - given resolution of symptoms could potentially called today. However given the duration of his symptoms, and his overall clinical picture, we will work this up as a likely stroke. Get MRI imaging of his brain, lipid panel, hemoglobin A1c, neurology consult. Permissive hypertension for the first 24 hours, blood pressure goal less than 220/110. Active Problems:   AKI (acute kidney injury) - unclear etiology, hydrate gently and monitor serum creatinine.   Abdominal pain, right upper quadrant - unclear etiology. Start with ultrasound right upper quadrant   Type 2 diabetes mellitus - sliding scale insulin with appropriate fingerstick glucose checks, heart healthy/carb modified diet   HTN (hypertension) - well controlled here, monitor closely and allow for permissive hypertension for the first 24 hours after symptoms of stroke as above. Home antihypertensives for this. Of time.   HLD (hyperlipidemia) - continue home statin medication.  All the records are reviewed and case discussed with ED provider. Management plans discussed with the patient and/or family.  DVT PROPHYLAXIS: SubQ heparin  ADMISSION STATUS: Inpatient, Observation  CODE STATUS: Full  TOTAL TIME TAKING CARE OF THIS PATIENT: 45 minutes.    Masae Lukacs [place] 05/15/2015, 11:11 PM  [facility]  Office  [phone]  CC: Primary care physician; Maryland Pink, MD

## 2015-05-15 NOTE — ED Provider Notes (Signed)
[facility] Emergency Department Provider Note  Time seen: 8:11 PM  I have reviewed the triage vital signs and the nursing notes.   HISTORY  Chief Complaint Weakness    HPI Michael Wright is a 74 y.o. male with a past medical history of hyperlipidemia, anemia, hypertension, diabetes presents the emergency department with confusion. According to the patient he laid down to take a nap around 3 PM. He is not sure if he fell asleep or if he stayed awake in bed, however upon awakening (does not know what time) patient did not recall what he was doing apparently he was trying to fix dinner. Did not recall where he was, what time it was, and did not recall taking a nap. Patient's family brought the patient to the emergency department for further evaluation. Patient denies any headache. Denies any focal weakness or numbness now or at any time. Patient is alert in the emergency department. But is not oriented to time. Patient's memory is improving per family, they state the patient was unable to give his birthdate for EMS, but was able to do so without any difficulty here. Patient still cannot give me the current year.    Past Medical History  Diagnosis Date  . Hyperlipidemia   . Anemia   . Hypertension   . Diabetes mellitus, type 2   . Kidney stones   . Cough     Patient Active Problem List   Diagnosis Date Noted  . Hyperlipidemia   . Hypertension   . Anemia   . Diabetes mellitus, type 2   . Extrinsic asthma, unspecified     Past Surgical History  Procedure Laterality Date  . Tonsillectomy    . Adenoidectomy  at age 77  . Vesicostomy    . Shoulder surgery      Current Outpatient Rx  Name  Route  Sig  Dispense  Refill  . allopurinol (ZYLOPRIM) 100 MG tablet   Oral   Take 100 mg by mouth daily.           Marland Kitchen aspirin 81 MG tablet   Oral   Take 81 mg by mouth daily.           . Azelastine HCl (ASTEPRO) 0.15 % SOLN   Nasal   Place 2 puffs into  the nose 1 day or 1 dose.           Marland Kitchen EXPIRED: ciclesonide (ALVESCO) 160 MCG/ACT inhaler   Inhalation   Inhale 2 puffs into the lungs at bedtime.           . colchicine 0.6 MG tablet   Oral   Take 0.6 mg by mouth 2 (two) times daily as needed.           Marland Kitchen glimepiride (AMARYL) 1 MG tablet   Oral   Take 1 mg by mouth 2 (two) times daily.           . metFORMIN (GLUCOPHAGE) 500 MG tablet   Oral   Take 500 mg by mouth 2 (two) times daily.           . mometasone (NASONEX) 50 MCG/ACT nasal spray   Nasal   Place 2 sprays into the nose at bedtime.           Marland Kitchen olmesartan-hydrochlorothiazide (BENICAR HCT) 40-25 MG per tablet   Oral   Take 1 tablet by mouth daily.           . pioglitazone-metformin (ACTOPLUS MET)  15-850 MG per tablet   Oral   Take 1 tablet by mouth daily.           . pravastatin (PRAVACHOL) 40 MG tablet   Oral   Take 1 tablet by mouth Daily.           Allergies Review of patient's allergies indicates no known allergies.  Family History  Problem Relation Age of Onset  . Allergies Daughter     Social History Social History  Substance Use Topics  . Smoking status: Never Smoker   . Smokeless tobacco: Former Systems developer    Types: [place] date: 08/31/1978  . Alcohol Use: No    Review of Systems Constitutional: Negative for fever. Cardiovascular: Negative for chest pain. Respiratory: Negative for shortness of breath. Gastrointestinal: Negative for abdominal pain, vomiting and diarrhea. Neurological: Negative for headaches, focal weakness or numbness. Positive for confusion/memory impairment  10-point ROS otherwise negative.  ____________________________________________   PHYSICAL EXAM:  VITAL SIGNS: ED Triage Vitals  Enc Vitals Group     BP 05/15/15 2000 150/88 mmHg     Pulse Rate 05/15/15 2000 95     Resp 05/15/15 2000 20     Temp 05/15/15 2000 97.9 F (36.6 C)     Temp Source 05/15/15 2000 Oral     SpO2 05/15/15 2000 97 %      Weight 05/15/15 2000 240 lb (108.863 kg)     Height 05/15/15 2000 _0  (1.727 m)     Head Cir --      Peak Flow --      Pain Score --      Pain Loc --      Pain Edu? --      Excl. in [place]? --     Constitutional: Alert. Oriented to place, situation, but not to time. Eyes: Normal exam, 2 mm PERRL bilaterally. EOMI. ENT   Head: Normocephalic and atraumatic   Mouth/Throat: Mucous membranes are moist. Cardiovascular: Normal rate, regular rhythm. No murmur Respiratory: Normal respiratory effort without tachypnea nor retractions. Breath sounds are clear and equal bilaterally.  Gastrointestinal: Soft and nontender. No distention. Musculoskeletal: Normal range of motion in all extremities. Neurologic:  Normal speech and language. No gross focal neurologic deficits are appreciated. Speech is normal. Patient oriented to place and situation, and person but not to time. Equal grip strength bilaterally. No pronator drift. Sensation equal in all extremities. 5/5 motor in all extremities. Skin:  Skin is warm, dry and intact.  Psychiatric: Mood and affect are normal. Speech and behavior are normal.   ____________________________________________    EKG  EKG reviewed and interpreted by myself shows normal sinus rhythm at 93 bpm, widened QRS, left axis deviation, nonspecific ST changes present. EKG most consistent with right bundle branch block.  ____________________________________________    RADIOLOGY  CT shows no acute abnormalities.  ____________________________________________   INITIAL IMPRESSION / ASSESSMENT AND PLAN / ED COURSE  Pertinent labs & imaging results that were available during my care of the patient were reviewed by me and considered in my medical decision making (see chart for details).  Patient with sudden onset of memory impairment. This appears to be resolving. Most consistent with transient ischemic attack. We will monitor closely in the emergency department,  obtained lab work, CT scan, EKG to help further evaluate. Patient denies any current weakness or numbness. Denies any headache. Family states it appears that his memory is improving, the patient still is disoriented to time.  CT  shows no acute abnormalities. Patient is not a TPA candidate given unclear timeline, as well as improving symptoms. Currently awaiting laboratory results. Patient will likely be admitted for TIA workup.  CT negative. Labs are largely within normal limits. Patient continues to have mild symptoms such as still does not recall the year. We will admit the patient for further workup for likely stroke first mini stroke.  NIH Stroke Scale   Time: 9:29 PM Person Administering Scale: Akbar Sacra  Administer stroke scale items in the order listed. Record performance in each category after each subscale exam. Do not go back and change scores. Follow directions provided for each exam technique. Scores should reflect what the patient does, not what the clinician thinks the patient can do. The clinician should record answers while administering the exam and work quickly. Except where indicated, the patient should not be coached (i.e., repeated requests to patient to make a special effort).   1a  Level of consciousness: 0=alert; keenly responsive  1b. LOC questions:  1  1c. LOC commands: 0=Performs both tasks correctly  2.  Best Gaze: 0=normal  3.  Visual: 0=No visual loss  4. Facial Palsy: 0=Normal symmetric movement  5a.  Motor left arm: 0=No drift, limb holds 90 (or 45) degrees for full 10 seconds  5b.  Motor right arm: 0=No drift, limb holds 90 (or 45) degrees for full 10 seconds  6a. motor left leg: 0=No drift, limb holds 90 (or 45) degrees for full 10 seconds  6b  Motor right leg:  0=No drift, limb holds 90 (or 45) degrees for full 10 seconds  7. Limb Ataxia: 0=Absent  8.  Sensory: 0=Normal; no sensory loss  9. Best Language:  0=No aphasia, normal  10. Dysarthria:  0=Normal  11. Extinction and Inattention: 0=No abnormality  12. Distal motor function: 0=Normal   Total:   1   ____________________________________________   FINAL CLINICAL IMPRESSION(S) / ED DIAGNOSES   cerebrovascular accident   Harvest Dark, MD 05/15/15 2130

## 2015-05-16 ENCOUNTER — Inpatient Hospital Stay: Payer: Commercial Managed Care - HMO

## 2015-05-16 ENCOUNTER — Inpatient Hospital Stay
Admit: 2015-05-16 | Discharge: 2015-05-16 | Disposition: A | Discharge status: Home / Self Care | Payer: Commercial Managed Care - HMO | Attending: Internal Medicine | Admitting: Internal Medicine

## 2015-05-16 LAB — HEMOGLOBIN A1C: HEMOGLOBIN A1C: 7.7 % — AB (ref 4.0–6.0)

## 2015-05-16 LAB — HEPATIC FUNCTION PANEL
ALT: 20 U/L (ref 17–63)
AST: 31 U/L (ref 15–41)
Albumin: 3.6 g/dL (ref 3.5–5.0)
Alkaline Phosphatase: 38 U/L (ref 38–126)
BILIRUBIN DIRECT: 0.1 mg/dL (ref 0.1–0.5)
BILIRUBIN INDIRECT: 0.8 mg/dL (ref 0.3–0.9)
BILIRUBIN TOTAL: 0.9 mg/dL (ref 0.3–1.2)
Total Protein: 6.6 g/dL (ref 6.5–8.1)

## 2015-05-16 LAB — BASIC METABOLIC PANEL
ANION GAP: 7 (ref 5–15)
BUN: 25 mg/dL — ABNORMAL HIGH (ref 6–20)
CO2: 33 mmol/L — ABNORMAL HIGH (ref 22–32)
Calcium: 8.7 mg/dL — ABNORMAL LOW (ref 8.9–10.3)
Chloride: 103 mmol/L (ref 101–111)
Creatinine, Ser: 1.36 mg/dL — ABNORMAL HIGH (ref 0.61–1.24)
GFR calc Af Amer: 58 mL/min — ABNORMAL LOW (ref >60)
GFR, EST NON AFRICAN AMERICAN: 50 mL/min — AB (ref >60)
Glucose, Bld: 186 mg/dL — ABNORMAL HIGH (ref 65–99)
POTASSIUM: 3.4 mmol/L — AB (ref 3.5–5.1)
SODIUM: 143 mmol/L (ref 135–145)

## 2015-05-16 LAB — GLUCOSE, CAPILLARY
Glucose-Capillary: 144 mg/dL — ABNORMAL HIGH (ref 65–99)
Glucose-Capillary: 167 mg/dL — ABNORMAL HIGH (ref 65–99)
Glucose-Capillary: 227 mg/dL — ABNORMAL HIGH (ref 65–99)

## 2015-05-16 LAB — LIPASE, BLOOD: Lipase: 61 U/L — ABNORMAL HIGH (ref 22–51)

## 2015-05-16 LAB — CBC
HEMATOCRIT: 38.3 % — AB (ref 40.0–52.0)
HEMOGLOBIN: 12.8 g/dL — AB (ref 13.0–18.0)
MCH: 30.3 pg (ref 26.0–34.0)
MCHC: 33.5 g/dL (ref 32.0–36.0)
MCV: 90.6 fL (ref 80.0–100.0)
Platelets: 190 10*3/uL (ref 150–440)
RBC: 4.23 MIL/uL — AB (ref 4.40–5.90)
RDW: 13.8 % (ref 11.5–14.5)
WBC: 9.7 10*3/uL (ref 3.8–10.6)

## 2015-05-16 LAB — LIPID PANEL
Cholesterol: 168 mg/dL (ref 0–200)
HDL: 34 mg/dL — AB (ref >40)
LDL CALC: 70 mg/dL (ref 0–99)
TRIGLYCERIDES: 320 mg/dL — AB (ref <150)
Total CHOL/HDL Ratio: 4.9 RATIO
VLDL: 64 mg/dL — AB (ref 0–40)

## 2015-05-16 MED ORDER — GEMFIBROZIL 600 MG PO TABS: 600.0000 mg | ORAL_TABLET | Freq: Two times a day (BID) | ORAL | Status: DC

## 2015-05-16 MED ORDER — PNEUMOCOCCAL 13-VAL CONJ VACC IM SUSP: 0.5000 mL | INTRAMUSCULAR | Status: DC

## 2015-05-16 MED ORDER — PANTOPRAZOLE SODIUM 40 MG PO TBEC: 40.0000 mg | DELAYED_RELEASE_TABLET | Freq: Every day | ORAL | Status: DC

## 2015-05-16 MED ORDER — ASPIRIN EC 325 MG PO TBEC
325.0000 mg | DELAYED_RELEASE_TABLET | Freq: Every day | ORAL | Status: DC
  Administered 2015-05-16: 325 mg via ORAL
  Filled 2015-05-16: qty 1

## 2015-05-16 MED ORDER — POTASSIUM CHLORIDE CRYS ER 20 MEQ PO TBCR
30.0000 meq | EXTENDED_RELEASE_TABLET | Freq: Two times a day (BID) | ORAL | Status: AC
  Administered 2015-05-16 (×2): 30 meq via ORAL
  Filled 2015-05-16 (×2): qty 1

## 2015-05-16 MED ORDER — ASPIRIN 325 MG PO TBEC: 325.0000 mg | DELAYED_RELEASE_TABLET | Freq: Every day | ORAL | Status: DC

## 2015-05-16 NOTE — Evaluation (Signed)
Physical Therapy Evaluation Patient Details Name: Michael Wright MRN: [id number] DOB: 1941-02-28 Today's Date: 05/16/2015   History of Present Illness  Pt is a 74 y.o. male presenting to hospital with expressive aphasia and weakness (now resolved).  Pt also with chronic constant R upper quadrant abdominal pain.  CT head negative.  Clinical Impression  Pt demonstrates steady independent ambulation without AD.  No loss of balance noted with functional mobility during session (Tinetti balance score 28/28 indicating pt is at low risk for falls).  No strength, coordination, proprioception, or sensation deficits noted in UE's or LE's.  Pt appears safe to discharge home when medically appropriate.  No further PT needs identified.  Will discharge pt from PT in house.    Follow Up Recommendations No PT follow up    Equipment Recommendations  None recommended by PT    Recommendations for Other Services       Precautions / Restrictions Precautions Precautions: Fall Restrictions Weight Bearing Restrictions: No      Mobility  Bed Mobility Overal bed mobility: Modified Independent             General bed mobility comments: extra effort to perform but able to do on his own  Transfers Overall transfer level: Independent Equipment used: None             General transfer comment: steady without loss of balance  Ambulation/Gait Ambulation/Gait assistance: Independent Ambulation Distance (Feet): 200 Feet Assistive device: None Gait Pattern/deviations: WFL(Within Functional Limits)   Gait velocity interpretation: at or above normal speed for age/gender General Gait Details: steady without loss of balance  Stairs Stairs: Yes Stairs assistance: Modified independent (Device/Increase time) Stair Management: One rail Right Number of Stairs: 7 General stair comments: alternating ascending; step to pattern descending; steady  Wheelchair Mobility    Modified Rankin (Stroke  Patients Only)       Balance Overall balance assessment: Modified Independent                               Standardized Balance Assessment Standardized Balance Assessment :  (Tinetti balance assessment:  pt scored 28/28 indicating pt is at low risk for falls)           Pertinent Vitals/Pain Pain Assessment: 0-10 Pain Score: 3  Pain Location: R upper quadrant abdominal pain Pain Descriptors / Indicators: Burning Pain Intervention(s): Limited activity within patient's tolerance;Monitored during session  Vitals stable and WFL throughout treatment session (see flowsheet for HR and O2 vitals).    Home Living Family/patient expects to be discharged to:: Private residence Living Arrangements: Spouse/significant other   Type of Home: House Home Access: Stairs to enter Entrance Stairs-Rails: None Entrance Stairs-Number of Steps: 2 plus 2 (wall for support) Home Layout: One level Home Equipment: None      Prior Function Level of Independence: Independent         Comments: Pt is primary caregiver for his wife (physically assists pt ambulating with RW for safety/balance)     Hand Dominance        Extremity/Trunk Assessment   Upper Extremity Assessment: Overall WFL for tasks assessed           Lower Extremity Assessment: Overall WFL for tasks assessed      Cervical / Trunk Assessment: Normal  Communication   Communication: No difficulties  Cognition Arousal/Alertness: Awake/alert Behavior During Therapy: WFL for tasks assessed/performed Overall Cognitive Status: Within Functional Limits for tasks  assessed                      General Comments   Nursing cleared pt for participation in physical therapy.  Pt agreeable to PT session.    Exercises        Assessment/Plan    PT Assessment Patent does not need any further PT services  PT Diagnosis     PT Problem List    PT Treatment Interventions     PT Goals (Current goals can be  found in the Care Plan section) Acute Rehab PT Goals Patient Stated Goal: To go home to his wife PT Goal Formulation: With patient Time For Goal Achievement: 05/30/15 Potential to Achieve Goals: Good    Frequency     Barriers to discharge        Co-evaluation               End of Session Equipment Utilized During Treatment: Gait belt Activity Tolerance: Patient tolerated treatment well Patient left: in bed;with call bell/phone within reach;with bed alarm set Nurse Communication: Mobility status         Time: 1206-1225 PT Time Calculation (min) (ACUTE ONLY): 19 min   Charges:   PT Evaluation $Initial PT Evaluation Tier I: 1 Procedure     PT G CodesLeitha Bleak 06-09-2015, 1:42 PM Leitha Bleak, [place]

## 2015-05-16 NOTE — Plan of Care (Signed)
Problem: Discharge/Transitional Outcomes Goal: Ability to attain medications upon leaving hospital Outcome: Completed/Met Date Met:  05/16/15 Patient and family state that he has resources to obtain medications.

## 2015-05-16 NOTE — Progress Notes (Signed)
PT Cancellation Note  Patient Details Name: BRITTAN PENLAND MRN: [id number] DOB: [birth year]   Cancelled Treatment:    Reason Eval/Treat Not Completed: Patient at procedure or test/unavailable.  Nursing reports pt off floor for ECHO.  Will re-attempt PT eval later today as able/as medically appropriate.   Raquel Sarna Mikela Senn 05/16/2015, 9:13 AM Leitha Bleak, [place]

## 2015-05-16 NOTE — Progress Notes (Signed)
Pt fro discharge home. A/o . No resp distress.  Skin wm drt.   Sl d/cd. [place] complete. Instructions discussed with pt and family.  meds discussed/ diet / activity and f/u  Discussed.  Home via w/c at this time.

## 2015-05-16 NOTE — Progress Notes (Signed)
*  PRELIMINARY RESULTS* Echocardiogram 2D Echocardiogram has been performed.  Michael Wright 05/16/2015, 8:46 AM

## 2015-05-16 NOTE — Progress Notes (Signed)
Speech Therapy Note: received order; consulted NSG and reviewed chart notes. Met w/ pt and Dtr. Pt reported his speech difficulties have resolved; pt conversing at conversational level in room w/ Dtr and SLP w/ no deficits appreciated. Pt indicated wants/needs appropriately. Pt and NSG denied any difficulty swallowing.  No skilled ST services indicated at this time as pt appears back at his baseline per his and Dtr's report. ST will be available for any further education/consultation if nec. Pt agreed.

## 2015-05-16 NOTE — Plan of Care (Signed)
Problem: Discharge/Transitional Outcomes Goal: Barriers To Progression Addressed/Resolved Outcome: Progressing Individualization of Care Pt prefers to be called Michael Wright Hx of HLD, HTN, DM, anemia, kidney stones, BPH, gout    Goal: Other Discharge Outcomes/Goals Education:  Patient had daughter at bedside.  They were given the stroke care booklet.  Education provided on stroke symptoms, medication administration and prevention. Hemodynamically:  Patient afebrile and VSS throughout shift. BP 121/53 mmHg  Pulse 91  Temp(Src) 98.1 F (36.7 C) (Oral)  Resp 20  Ht 5\' 7"  (1.702 m)  Wt 236 lb 14.4 oz (107.457 kg)  BMI 37.10 kg/m2  SpO2 95% Mobility:  Patient stable on feet with no affected weakness.  Patient is high fall risk and is on fall risk precautions throughout shift.  He does call when needing assistance. Diet:  Patient on carb modified/cardiac diet.  He arrived to the floor without eating dinner. He was given a sandwich tray with sugar free jello.  Appetite is good and patient tolerated diet well. INR:  Lovenox given this shift.  Education provided on INR. Family:  Daughter at bedside.  Patient and family agreed on care plan for shift.  Neuro checks and vitals taken every two hours throughout shift

## 2015-05-16 NOTE — Consult Note (Signed)
Reason for Consult: stroke Referring Physician: Dr. Arabella Merles is an 74 y.o. male.  HPI:  Seen at request of Dr. Posey Pronto for stroke;  74 yo RHD M presents to [facility] after having difficulty speaking.  Pt reports that he could not get his words out to his wife but was able to follow all commands.  He states that he was able to however talk to other family members.  He states that this lasted under one hour.  He denies headache, memory loss or any weakness or numbness.  This has never happened before.  Pt does state that his stomach has been hurting over the last six months.  He also notes that he has not been on ASA for the past 6 days.  Past Medical History  Diagnosis Date  . Hyperlipidemia   . Anemia   . Hypertension   . Diabetes mellitus, type 2   . Kidney stones   . Cough   . BPH (benign prostatic hyperplasia)   . Gout     Past Surgical History  Procedure Laterality Date  . Tonsillectomy    . Adenoidectomy  at age 69  . Vesicostomy    . Shoulder surgery      Family History  Problem Relation Age of Onset  . Allergies Daughter   . Diabetes Father     Social History:  reports that he has never smoked. He quit smokeless tobacco use about 36 years ago. His smokeless tobacco use included Chew. He reports that he does not drink alcohol or use illicit drugs.  Allergies: No Known Allergies  Medications: personally reviewed by me as per chart  Results for orders placed or performed during the hospital encounter of 05/15/15 (from the past 48 hour(s))  CBC     Status: Abnormal   Collection Time: 05/15/15  8:12 PM  Result Value Ref Range   WBC 10.9 (H) 3.8 - 10.6 K/uL   RBC 4.46 4.40 - 5.90 MIL/uL   Hemoglobin 12.8 (L) 13.0 - 18.0 g/dL   HCT 40.4 40.0 - 52.0 %   MCV 90.6 80.0 - 100.0 fL   MCH 28.8 26.0 - 34.0 pg   MCHC 31.8 (L) 32.0 - 36.0 g/dL   RDW 13.8 11.5 - 14.5 %   Platelets 232 150 - 440 K/uL  Comprehensive metabolic panel     Status: Abnormal   Collection  Time: 05/15/15  8:12 PM  Result Value Ref Range   Sodium 141 135 - 145 mmol/L   Potassium 3.1 (L) 3.5 - 5.1 mmol/L   Chloride 101 101 - 111 mmol/L   CO2 29 22 - 32 mmol/L   Glucose, Bld 144 (H) 65 - 99 mg/dL   BUN 28 (H) 6 - 20 mg/dL   Creatinine, Ser 1.42 (H) 0.61 - 1.24 mg/dL   Calcium 8.9 8.9 - 10.3 mg/dL   Total Protein 7.6 6.5 - 8.1 g/dL   Albumin 4.1 3.5 - 5.0 g/dL   AST 34 15 - 41 U/L   ALT 23 17 - 63 U/L   Alkaline Phosphatase 47 38 - 126 U/L   Total Bilirubin 1.2 0.3 - 1.2 mg/dL   GFR calc non Af Amer 47 (L) >60 mL/min   GFR calc Af Amer 55 (L) >60 mL/min    Comment: (NOTE) The eGFR has been calculated using the CKD EPI equation. This calculation has not been validated in all clinical situations. eGFR's persistently <60 mL/min signify possible Chronic Kidney Disease.  Anion gap 11 5 - 15  Troponin I     Status: None   Collection Time: 05/15/15  8:12 PM  Result Value Ref Range   Troponin I <0.03 <0.031 ng/mL    Comment:        NO INDICATION OF MYOCARDIAL INJURY.   Hemoglobin A1c     Status: Abnormal   Collection Time: 05/15/15  8:12 PM  Result Value Ref Range   Hgb A1c MFr Bld 7.7 (H) 4.0 - 6.0 %  Urinalysis complete, with microscopic ([facility] only)     Status: Abnormal   Collection Time: 05/15/15  8:37 PM  Result Value Ref Range   Color, Urine YELLOW (A) YELLOW   APPearance CLEAR (A) CLEAR   Glucose, UA NEGATIVE NEGATIVE mg/dL   Bilirubin Urine NEGATIVE NEGATIVE   Ketones, ur NEGATIVE NEGATIVE mg/dL   Specific Gravity, Urine 1.017 1.005 - 1.030   Hgb urine dipstick NEGATIVE NEGATIVE   pH 5.0 5.0 - 8.0   Protein, ur NEGATIVE NEGATIVE mg/dL   Nitrite NEGATIVE NEGATIVE   Leukocytes, UA NEGATIVE NEGATIVE   RBC / HPF 0-5 0 - 5 RBC/hpf   WBC, UA 0-5 0 - 5 WBC/hpf   Bacteria, UA NONE SEEN NONE SEEN   Squamous Epithelial / LPF NONE SEEN NONE SEEN   Mucous PRESENT   Glucose, capillary     Status: Abnormal   Collection Time: 05/16/15 12:47 AM  Result Value Ref  Range   Glucose-Capillary 144 (H) 65 - 99 mg/dL  Basic metabolic panel     Status: Abnormal   Collection Time: 05/16/15  5:44 AM  Result Value Ref Range   Sodium 143 135 - 145 mmol/L   Potassium 3.4 (L) 3.5 - 5.1 mmol/L   Chloride 103 101 - 111 mmol/L   CO2 33 (H) 22 - 32 mmol/L   Glucose, Bld 186 (H) 65 - 99 mg/dL   BUN 25 (H) 6 - 20 mg/dL   Creatinine, Ser 1.36 (H) 0.61 - 1.24 mg/dL   Calcium 8.7 (L) 8.9 - 10.3 mg/dL   GFR calc non Af Amer 50 (L) >60 mL/min   GFR calc Af Amer 58 (L) >60 mL/min    Comment: (NOTE) The eGFR has been calculated using the CKD EPI equation. This calculation has not been validated in all clinical situations. eGFR's persistently <60 mL/min signify possible Chronic Kidney Disease.    Anion gap 7 5 - 15  CBC     Status: Abnormal   Collection Time: 05/16/15  5:44 AM  Result Value Ref Range   WBC 9.7 3.8 - 10.6 K/uL   RBC 4.23 (L) 4.40 - 5.90 MIL/uL   Hemoglobin 12.8 (L) 13.0 - 18.0 g/dL   HCT 38.3 (L) 40.0 - 52.0 %   MCV 90.6 80.0 - 100.0 fL   MCH 30.3 26.0 - 34.0 pg   MCHC 33.5 32.0 - 36.0 g/dL   RDW 13.8 11.5 - 14.5 %   Platelets 190 150 - 440 K/uL  Lipid panel     Status: Abnormal   Collection Time: 05/16/15  5:44 AM  Result Value Ref Range   Cholesterol 168 0 - 200 mg/dL   Triglycerides 320 (H) <150 mg/dL   HDL 34 (L) >40 mg/dL   Total CHOL/HDL Ratio 4.9 RATIO   VLDL 64 (H) 0 - 40 mg/dL   LDL Cholesterol 70 0 - 99 mg/dL    Comment:        Total Cholesterol/HDL:CHD Risk Coronary Heart Disease  Risk Table                     Men   Women  1/2 Average Risk   3.4   3.3  Average Risk       5.0   4.4  2 X Average Risk   9.6   7.1  3 X Average Risk  23.4   11.0        Use the calculated Patient Ratio above and the CHD Risk Table to determine the patient's CHD Risk.        ATP III CLASSIFICATION (LDL):  <100     mg/dL   Optimal  100-129  mg/dL   Near or Above                    Optimal  130-159  mg/dL   Borderline  160-189  mg/dL    High  >190     mg/dL   Very High   Hepatic function panel     Status: None   Collection Time: 05/16/15  5:44 AM  Result Value Ref Range   Total Protein 6.6 6.5 - 8.1 g/dL   Albumin 3.6 3.5 - 5.0 g/dL   AST 31 15 - 41 U/L   ALT 20 17 - 63 U/L   Alkaline Phosphatase 38 38 - 126 U/L   Total Bilirubin 0.9 0.3 - 1.2 mg/dL   Bilirubin, Direct 0.1 0.1 - 0.5 mg/dL   Indirect Bilirubin 0.8 0.3 - 0.9 mg/dL  Lipase, blood     Status: Abnormal   Collection Time: 05/16/15  5:44 AM  Result Value Ref Range   Lipase 61 (H) 22 - 51 U/L  Glucose, capillary     Status: Abnormal   Collection Time: 05/16/15  7:19 AM  Result Value Ref Range   Glucose-Capillary 167 (H) 65 - 99 mg/dL   Comment 1 Notify RN   Glucose, capillary     Status: Abnormal   Collection Time: 05/16/15 10:32 AM  Result Value Ref Range   Glucose-Capillary 227 (H) 65 - 99 mg/dL   Comment 1 Notify RN     Dg Chest 2 View  05/16/2015   CLINICAL DATA:  Right-sided chest pain for 6 weeks.  EXAM: CHEST  2 VIEW  COMPARISON:  Chest CT 04/29/2011  FINDINGS: Lung volumes are low accentuating size of the cardiac silhouette. The cardiomediastinal contours are normal for technique with likely mediastinal lipomatosis is seen on prior CT. Minimal subsegmental atelectasis at the right lung base. Calcified granuloma in the left lower lobe. Pulmonary vasculature is normal. No consolidation, pleural effusion, or pneumothorax. No acute osseous abnormalities are seen.  IMPRESSION: Hypoventilatory chest with right basilar atelectasis.   Electronically Signed   By: Jeb Levering M.D.   On: 05/16/2015 00:37   Ct Head Wo Contrast  05/15/2015   CLINICAL DATA:  Acute mental status changes.  EXAM: CT HEAD WITHOUT CONTRAST  TECHNIQUE: Contiguous axial images were obtained from the base of the skull through the vertex without intravenous contrast.  COMPARISON:  None.  FINDINGS: The ventricles are normal in size and configuration. No extra-axial fluid collections  are identified. The gray-white differentiation is normal. Incidental cavum septum pellucidum. No CT findings for acute intracranial process such as hemorrhage or infarction. No mass lesions. The brainstem and cerebellum are grossly normal.  The bony structures are intact. The paranasal sinuses and mastoid air cells are clear. The globes are intact.  IMPRESSION: No  CT findings for acute intracranial process.   Electronically Signed   By: Marijo Sanes M.D.   On: 05/15/2015 20:37   US Abdomen Complete  05/16/2015   CLINICAL DATA:  Abdominal pain, right upper quadrant for 6 weeks  EXAM: ULTRASOUND ABDOMEN COMPLETE  COMPARISON:  None.  FINDINGS: Gallbladder: No stones seen, with sensitivity limited by degree of hepatic steatosis. No visible wall thickening or focal tenderness.  Common bile duct: Diameter: 3 mm  Liver: Remarkably dense liver with poor acoustic transmission, resulting in limited visualization of the mid and posterior organ. No masses seen.  IVC: Not visualized  Pancreas: Not visualized due to bowel gas  Spleen: Size and appearance within normal limits.  Right Kidney: Length: 11 cm. Echogenicity within normal limits. No mass or hydronephrosis visualized.  Left Kidney: Length: 12 cm. Echogenicity within normal limits. No mass or hydronephrosis visualized.  Abdominal aorta: No aneurysm visualized.  Other findings: None.  IMPRESSION: 1. No acute finding.  Negative gallbladder. 2. Marked hepatic steatosis, which significantly limits the utility of sonography in evaluating the liver 3. Pancreas and IVC not visualized due to bowel gas and #2.   Electronically Signed   By: Monte Fantasia M.D.   On: 05/16/2015 15:24   US Carotid Bilateral  05/16/2015   CLINICAL DATA:  CVA.  History of hypertension and diabetes.  EXAM: BILATERAL CAROTID DUPLEX ULTRASOUND  TECHNIQUE: Pearline Cables scale imaging, color Doppler and duplex ultrasound were performed of bilateral carotid and vertebral arteries in the neck.  COMPARISON:   None.  FINDINGS: Examination is degraded due to patient body habitus and poor sonographic window.  Criteria: Quantification of carotid stenosis is based on velocity parameters that correlate the residual internal carotid diameter with NASCET-based stenosis levels, using the diameter of the distal internal carotid lumen as the denominator for stenosis measurement.  The following velocity measurements were obtained:  RIGHT  ICA:  80/16 cm/sec  CCA:  47/4 cm/sec  SYSTOLIC ICA/CCA RATIO:  1.1  DIASTOLIC ICA/CCA RATIO:  1.8  ECA:  106 cm/sec  LEFT  ICA:  76/24 cm/sec  CCA:  25/95 cm/sec  SYSTOLIC ICA/CCA RATIO:  0.8  DIASTOLIC ICA/CCA RATIO:  1.5  ECA:  94 cm/sec  RIGHT CAROTID ARTERY: There is a minimal amount of eccentric hypoechoic plaque within the right carotid bulb (images 8 and 29). There is a minimal amount of eccentric mixed echogenic plaque involving the origin and proximal aspect of the right internal carotid artery (image 17), not resulting in elevated peak systolic velocities within the interrogated course of the right internal carotid artery to suggest a hemodynamically significant stenosis.  RIGHT VERTEBRAL ARTERY:  Antegrade flow  LEFT CAROTID ARTERY: There is a minimal amount of eccentric mixed echogenic plaque involving the origin and proximal aspects of the left internal carotid artery (image 48), not resulting in elevated peak systolic velocities with the interrogated course of the left internal carotid artery to suggest a hemodynamically significant stenosis.  LEFT VERTEBRAL ARTERY:  Antegrade flow  IMPRESSION: Very minimal amount of bilateral atherosclerotic plaque, right subjectively greater than left, not resulting in a hemodynamically significant stenosis.   Electronically Signed   By: Sandi Mariscal M.D.   On: 05/16/2015 09:49    Review of Systems  Constitutional: Negative.   HENT: Negative.   Eyes: Negative.   Respiratory: Negative.   Cardiovascular: Negative.   Gastrointestinal:  Negative.   Genitourinary: Negative.   Musculoskeletal: Negative.   Skin: Negative.   Neurological: Positive for speech change.  Negative for dizziness, tingling, tremors, sensory change, focal weakness, seizures and loss of consciousness.  Psychiatric/Behavioral: Negative.    Blood pressure 125/103, pulse 91, temperature 97.9 F (36.6 C), temperature source Oral, resp. rate 20, height '5\' 7"'  (1.702 m), weight 107.457 kg (236 lb 14.4 oz), SpO2 95 %. Physical Exam  Nursing note and vitals reviewed. Constitutional: He is oriented to person, place, and time. He appears well-developed and well-nourished. No distress.  HENT:  Head: Normocephalic and atraumatic.  Right Ear: External ear normal.  Left Ear: External ear normal.  Nose: Nose normal.  Mouth/Throat: Oropharynx is clear and moist.  Eyes: Conjunctivae and EOM are normal. Pupils are equal, round, and reactive to light. No scleral icterus.  Neck: Normal range of motion. Neck supple. No JVD present.  Cardiovascular: Normal rate, regular rhythm, normal heart sounds and intact distal pulses.   No murmur heard. Respiratory: Effort normal and breath sounds normal. No respiratory distress.  GI: Soft. Bowel sounds are normal. He exhibits no distension. There is tenderness.  Musculoskeletal: Normal range of motion. He exhibits no edema.  Neurological: He is alert and oriented to person, place, and time. He has normal reflexes. He displays normal reflexes. No cranial nerve deficit. He exhibits normal muscle tone. Coordination normal.  Skin: He is not diaphoretic.  Psychiatric: He has a normal mood and affect. His behavior is normal.   CT of head personally reviewed by me and shows trace white matter changes and mild atrophy  Assessment/Plan: 1.  Possible transient ischemic attack-  Time period of event goes along with TIA however it is highly unusual that pt could talk to other people though.  No evidence of seizure by history.  Pt is too  claustrophobic for MRI. -  Restart ASA 43m daily  -  No need for MRI due to above -  Needs better DM control with goal Hem A1c < 7 -  Needs better BP control with goal < 130/80 -  F/u on carotid [place] echo; please call if abnormal -  Will sign off, please call with questions -  [facility]  [place]9/15/2016, 4:32 PM

## 2015-05-16 NOTE — Discharge Instructions (Signed)
°  DIET:  Cardiac diet, low fat, low cholestrol  DISCHARGE CONDITION:  Good  ACTIVITY:  Activity as tolerated  OXYGEN:  Home Oxygen: No.   Oxygen Delivery: room air  DISCHARGE LOCATION:  home    ADDITIONAL DISCHARGE INSTRUCTION:   If you experience worsening of your admission symptoms, develop shortness of breath, life threatening emergency, suicidal or homicidal thoughts you must seek medical attention immediately by calling 911 or calling your MD immediately  if symptoms less severe.  You Must read complete instructions/literature along with all the possible adverse reactions/side effects for all the Medicines you take and that have been prescribed to you. Take any new Medicines after you have completely understood and accpet all the possible adverse reactions/side effects.   Please note  You were cared for by a hospitalist during your hospital stay. If you have any questions about your discharge medications or the care you received while you were in the hospital after you are discharged, you can call the unit and asked to speak with the hospitalist on call if the hospitalist that took care of you is not available. Once you are discharged, your primary care physician will handle any further medical issues. Please note that NO REFILLS for any discharge medications will be authorized once you are discharged, as it is imperative that you return to your primary care physician (or establish a relationship with a primary care physician if you do not have one) for your aftercare needs so that they can reassess your need for medications and monitor your lab values.

## 2015-05-16 NOTE — Progress Notes (Signed)
OT Cancellation Note  Patient Details Name: Michael Wright MRN: [id number] DOB: [birth year]   Cancelled Treatment:    Reason Eval/Treat Not Completed: Patient at procedure or test/ unavailable. Patient off the floor for a test.  [name] 05/16/2015, 9:27 AM

## 2015-05-16 NOTE — Clinical Social Work Note (Signed)
Clinical Social Work Assessment  Patient Details  Name: Michael Wright MRN: [id number] Date of Birth: 12/07/1940  Date of referral:  05/16/15               Reason for consult:  Facility Placement, Other (Comment Required) (Patient had a CVA)                Permission sought to share information with:  Case Manager Permission granted to share information::  No  Name::        Agency::     Relationship::     Contact Information:     Housing/Transportation Living arrangements for the past 2 months:  Single Family Home Source of Information:  Patient Patient Interpreter Needed:  None Criminal Activity/Legal Involvement Pertinent to Current Situation/Hospitalization:  No - Comment as needed Significant Relationships:  Adult Children, Spouse Lives with:  Spouse (Patient is caregiver 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 ) Do you feel safe going back to the place where you live?  Yes Need for family participation in patient care:  No (Coment)  Care giving concerns: Patient lives in [place] with his wife.    Social Worker assessment / plan: Holiday representative ([place]) received verbal consult from RN that patient had a CVA and may need rehab. CSW met with patient alone at bedside. Patient was alert and oriented and laying in the bed. CSW introduced self and explained role of CSW department. Patient reported that he lives in [place] with his wife Michael Wright. Patient reported that he has been caring for his wife during their whole marriage. Patient reported that his wife has always been sick even when they started dating. Patient reported that he helps his wife to the bathroom because she can barely walk. Patient reported that they have 3 supportive daughters that help care for wife as well. Patient reported that he is going home today. Patient walked around the nurses station with PT. RN Case Manager aware of above. Please reconsult if future social work needs arise. CSW signing off.    Employment status:   Retired Nurse, adult PT Recommendations:  Not assessed at this time Information / Referral to community resources:  Other (Comment Required) (RN Case Manager )  Patient/Family's Response to care: Patient is agreeable to going home.   Patient/Family's Understanding of and Emotional Response to Diagnosis, Current Treatment, and Prognosis: Patient was pleasant throughout assessment.   Emotional Assessment Appearance:  Appears stated age Attitude/Demeanor/Rapport:    Affect (typically observed):  Accepting, Adaptable, Pleasant Orientation:  Oriented to Self, Oriented to Place, Oriented to  Time, Oriented to Situation Alcohol / Substance use:  Not Applicable Psych involvement (Current and /or in the community):  No (Comment)  Discharge Needs  Concerns to be addressed:  No discharge needs identified Readmission within the last 30 days:  No Current discharge risk:  None Barriers to Discharge:  No Barriers Identified   Loralyn Freshwater, LCSW 05/16/2015, 12:03 PM

## 2015-05-17 NOTE — Discharge Summary (Signed)
Dillon Beach, 74 y.o., DOB March 06, 1941, MRN [id number]. Admission date: 05/15/2015 Discharge Date 05/17/2015 Primary MD Maryland Pink, MD Admitting Physician Lance Coon, MD  Admission Diagnosis  Stroke [I63.9] Cerebral infarction due to unspecified mechanism [I63.9]  Discharge Diagnosis   Principal Problem: TIA   AKI (acute kidney injury)   Type 2 diabetes mellitus   HTN (hypertension)   HLD (hyperlipidemia)   Abdominal pain, right upper quadrant negative right upper quadrant ultrasound except severe hepatic steatosis Hypertriglyceridemia         Hospital Course patient is a 74 yo RHD M presents to [facility] after having difficulty speaking. Pt reports that he could not get his words out to his wife but was able to follow all commands. He states that he was able to however talk to other family members. He states that this lasted under one hour. He had a CT scan of the head which was negative patient was admitted to the hospital for further workup MRI was ordered however patient stated that he was very claustrophobic and did not want to have MRI. His carotid Dopplers showed some plaque but no significant stenosis.. Patient also was having right upper quadrant pain his LFTs and lipase were normal. He underwent a right upper quadrant ultrasound which showed fatty liver. If he continues to have symptoms will need outpatient follow-up with GI            Consults  neurology   Significant Tests:  See full reports for all details    Dg Chest 2 View  05/16/2015   CLINICAL DATA:  Right-sided chest pain for 6 weeks.  EXAM: CHEST  2 VIEW  COMPARISON:  Chest CT 04/29/2011  FINDINGS: Lung volumes are low accentuating size of the cardiac silhouette. The cardiomediastinal contours are normal for technique with likely mediastinal lipomatosis is seen on prior CT. Minimal subsegmental atelectasis at the right lung base. Calcified granuloma in the left lower lobe. Pulmonary vasculature is  normal. No consolidation, pleural effusion, or pneumothorax. No acute osseous abnormalities are seen.  IMPRESSION: Hypoventilatory chest with right basilar atelectasis.   Electronically Signed   By: Jeb Levering M.D.   On: 05/16/2015 00:37   Ct Head Wo Contrast  05/15/2015   CLINICAL DATA:  Acute mental status changes.  EXAM: CT HEAD WITHOUT CONTRAST  TECHNIQUE: Contiguous axial images were obtained from the base of the skull through the vertex without intravenous contrast.  COMPARISON:  None.  FINDINGS: The ventricles are normal in size and configuration. No extra-axial fluid collections are identified. The gray-white differentiation is normal. Incidental cavum septum pellucidum. No CT findings for acute intracranial process such as hemorrhage or infarction. No mass lesions. The brainstem and cerebellum are grossly normal.  The bony structures are intact. The paranasal sinuses and mastoid air cells are clear. The globes are intact.  IMPRESSION: No CT findings for acute intracranial process.   Electronically Signed   By: Marijo Sanes M.D.   On: 05/15/2015 20:37   US Abdomen Complete  05/16/2015   CLINICAL DATA:  Abdominal pain, right upper quadrant for 6 weeks  EXAM: ULTRASOUND ABDOMEN COMPLETE  COMPARISON:  None.  FINDINGS: Gallbladder: No stones seen, with sensitivity limited by degree of hepatic steatosis. No visible wall thickening or focal tenderness.  Common bile duct: Diameter: 3 mm  Liver: Remarkably dense liver with poor acoustic transmission, resulting in limited visualization of the mid and posterior organ. No masses seen.  IVC: Not visualized  Pancreas: Not visualized due to  bowel gas  Spleen: Size and appearance within normal limits.  Right Kidney: Length: 11 cm. Echogenicity within normal limits. No mass or hydronephrosis visualized.  Left Kidney: Length: 12 cm. Echogenicity within normal limits. No mass or hydronephrosis visualized.  Abdominal aorta: No aneurysm visualized.  Other findings:  None.  IMPRESSION: 1. No acute finding.  Negative gallbladder. 2. Marked hepatic steatosis, which significantly limits the utility of sonography in evaluating the liver 3. Pancreas and IVC not visualized due to bowel gas and #2.   Electronically Signed   By: Monte Fantasia M.D.   On: 05/16/2015 15:24   US Carotid Bilateral  05/16/2015   CLINICAL DATA:  CVA.  History of hypertension and diabetes.  EXAM: BILATERAL CAROTID DUPLEX ULTRASOUND  TECHNIQUE: Pearline Cables scale imaging, color Doppler and duplex ultrasound were performed of bilateral carotid and vertebral arteries in the neck.  COMPARISON:  None.  FINDINGS: Examination is degraded due to patient body habitus and poor sonographic window.  Criteria: Quantification of carotid stenosis is based on velocity parameters that correlate the residual internal carotid diameter with NASCET-based stenosis levels, using the diameter of the distal internal carotid lumen as the denominator for stenosis measurement.  The following velocity measurements were obtained:  RIGHT  ICA:  80/16 cm/sec  CCA:  99991111 cm/sec  SYSTOLIC ICA/CCA RATIO:  1.1  DIASTOLIC ICA/CCA RATIO:  1.8  ECA:  106 cm/sec  LEFT  ICA:  76/24 cm/sec  CCA:  [id number] cm/sec  SYSTOLIC ICA/CCA RATIO:  0.8  DIASTOLIC ICA/CCA RATIO:  1.5  ECA:  94 cm/sec  RIGHT CAROTID ARTERY: There is a minimal amount of eccentric hypoechoic plaque within the right carotid bulb (images 8 and 29). There is a minimal amount of eccentric mixed echogenic plaque involving the origin and proximal aspect of the right internal carotid artery (image 17), not resulting in elevated peak systolic velocities within the interrogated course of the right internal carotid artery to suggest a hemodynamically significant stenosis.  RIGHT VERTEBRAL ARTERY:  Antegrade flow  LEFT CAROTID ARTERY: There is a minimal amount of eccentric mixed echogenic plaque involving the origin and proximal aspects of the left internal carotid artery (image 48), not resulting in  elevated peak systolic velocities with the interrogated course of the left internal carotid artery to suggest a hemodynamically significant stenosis.  LEFT VERTEBRAL ARTERY:  Antegrade flow  IMPRESSION: Very minimal amount of bilateral atherosclerotic plaque, right subjectively greater than left, not resulting in a hemodynamically significant stenosis.   Electronically Signed   By: Sandi Mariscal M.D.   On: 05/16/2015 09:49       Today   Subjective:   Dacari Doorn feels better denies any speech trouble   Blood pressure 125/103, pulse 91, temperature 97.9 F (36.6 C), temperature source Oral, resp. rate 20, height 5\' 7"  (1.702 m), weight 107.457 kg (236 lb 14.4 oz), SpO2 95 %.  . No intake or output data in the 24 hours ending 05/17/15 1401  Exam VITAL SIGNS: Blood pressure 125/103, pulse 91, temperature 97.9 F (36.6 C), temperature source Oral, resp. rate 20, height 5\' 7"  (1.702 m), weight 107.457 kg (236 lb 14.4 oz), SpO2 95 %.  GENERAL:  74 y.o.-year-old patient lying in the bed with no acute distress.  EYES: Pupils equal, round, reactive to light and accommodation. No scleral icterus. Extraocular muscles intact.  HEENT: Head atraumatic, normocephalic. Oropharynx and nasopharynx clear.  NECK:  Supple, no jugular venous distention. No thyroid enlargement, no tenderness.  LUNGS: Normal breath sounds  bilaterally, no wheezing, rales,rhonchi or crepitation. No use of accessory muscles of respiration.  CARDIOVASCULAR: S1, S2 normal. No murmurs, rubs, or gallops.  ABDOMEN: Soft, nontender, nondistended. Bowel sounds present. No organomegaly or mass.  EXTREMITIES: No pedal edema, cyanosis, or clubbing.  NEUROLOGIC: Cranial nerves II through XII are intact. Muscle strength 5/5 in all extremities. Sensation intact. Gait not checked.  PSYCHIATRIC: The patient is alert and oriented x 3.  SKIN: No obvious rash, lesion, or ulcer.   Data Review     CBC w Diff:  Lab Results  Component  Value Date   WBC 9.7 05/16/2015   HGB 12.8* 05/16/2015   HCT 38.3* 05/16/2015   PLT 190 05/16/2015   CMP:  Lab Results  Component Value Date   NA 143 05/16/2015   K 3.4* 05/16/2015   CL 103 05/16/2015   CO2 33* 05/16/2015   BUN 25* 05/16/2015   CREATININE 1.36* 05/16/2015   PROT 6.6 05/16/2015   ALBUMIN 3.6 05/16/2015   BILITOT 0.9 05/16/2015   ALKPHOS 38 05/16/2015   AST 31 05/16/2015   ALT 20 05/16/2015  .  Micro Results No results found for this or any previous visit (from the past 240 hour(s)).         Follow-up Information    Follow up with Maryland Pink, MD On 05/28/2015.   Specialty:  Family Medicine   Why:  Appt scheduled @ 9:15    Contact information:   [place] [address] [phone]       Discharge Medications     Medication List    TAKE these medications        allopurinol 100 MG tablet  Commonly known as:  ZYLOPRIM  Take 100 mg by mouth daily.     aspirin 325 MG EC tablet  Take 1 tablet (325 mg total) by mouth daily.     colchicine 0.6 MG tablet  Take 0.6 mg by mouth 2 (two) times daily as needed (for gout flares).     finasteride 5 MG tablet  Commonly known as:  PROSCAR  Take 5 mg by mouth daily.     gemfibrozil 600 MG tablet  Commonly known as:  LOPID  Take 1 tablet (600 mg total) by mouth 2 (two) times daily before a meal.     glimepiride 2 MG tablet  Commonly known as:  AMARYL  Take 2 mg by mouth 2 (two) times daily.     losartan-hydrochlorothiazide 100-25 MG per tablet  Commonly known as:  HYZAAR  Take 1 tablet by mouth daily.     metFORMIN 850 MG tablet  Commonly known as:  GLUCOPHAGE  Take 850 mg by mouth daily with breakfast.     metFORMIN 500 MG tablet  Commonly known as:  GLUCOPHAGE  Take 500 mg by mouth 2 (two) times daily with a meal.     pantoprazole 40 MG tablet  Commonly known as:  PROTONIX  Take 1 tablet (40 mg total) by mouth daily.     pioglitazone 15 MG tablet  Commonly known as:   ACTOS  Take 15 mg by mouth daily.     pravastatin 40 MG tablet  Commonly known as:  PRAVACHOL  Take 40 mg by mouth at bedtime.           Total Time in preparing paper work, data evaluation and todays exam - 35 minutes  Dustin Flock M.D on 05/17/2015 at 2:01 [facility]  [facility]   Office  [phone] 

## 2015-05-20 DIAGNOSIS — R1011 Right upper quadrant pain: Secondary | ICD-10-CM | POA: Diagnosis not present

## 2015-05-20 DIAGNOSIS — Z8673 Personal history of transient ischemic attack (TIA), and cerebral infarction without residual deficits: Secondary | ICD-10-CM | POA: Diagnosis not present

## 2015-05-20 DIAGNOSIS — G8929 Other chronic pain: Secondary | ICD-10-CM | POA: Diagnosis not present

## 2015-05-28 DIAGNOSIS — E785 Hyperlipidemia, unspecified: Secondary | ICD-10-CM | POA: Diagnosis not present

## 2015-05-28 DIAGNOSIS — E119 Type 2 diabetes mellitus without complications: Secondary | ICD-10-CM | POA: Diagnosis not present

## 2015-05-28 DIAGNOSIS — R1011 Right upper quadrant pain: Secondary | ICD-10-CM | POA: Diagnosis not present

## 2015-05-28 DIAGNOSIS — G459 Transient cerebral ischemic attack, unspecified: Secondary | ICD-10-CM | POA: Diagnosis not present

## 2015-05-28 DIAGNOSIS — R194 Change in bowel habit: Secondary | ICD-10-CM | POA: Diagnosis not present

## 2015-05-31 DIAGNOSIS — R194 Change in bowel habit: Secondary | ICD-10-CM | POA: Diagnosis not present

## 2015-06-05 DIAGNOSIS — R002 Palpitations: Secondary | ICD-10-CM | POA: Diagnosis not present

## 2015-06-05 DIAGNOSIS — E65 Localized adiposity: Secondary | ICD-10-CM | POA: Diagnosis not present

## 2015-06-05 DIAGNOSIS — G451 Carotid artery syndrome (hemispheric): Secondary | ICD-10-CM | POA: Diagnosis not present

## 2015-06-05 DIAGNOSIS — R0683 Snoring: Secondary | ICD-10-CM | POA: Diagnosis not present

## 2015-06-05 DIAGNOSIS — R55 Syncope and collapse: Secondary | ICD-10-CM | POA: Diagnosis not present

## 2015-06-12 DIAGNOSIS — R55 Syncope and collapse: Secondary | ICD-10-CM | POA: Diagnosis not present

## 2015-06-13 ENCOUNTER — Other Ambulatory Visit: Payer: Self-pay | Admitting: Nurse Practitioner

## 2015-06-13 DIAGNOSIS — B839 Helminthiasis, unspecified: Secondary | ICD-10-CM | POA: Diagnosis not present

## 2015-06-13 DIAGNOSIS — R1011 Right upper quadrant pain: Secondary | ICD-10-CM | POA: Diagnosis not present

## 2015-06-15 DIAGNOSIS — B839 Helminthiasis, unspecified: Secondary | ICD-10-CM | POA: Diagnosis not present

## 2015-06-18 ENCOUNTER — Other Ambulatory Visit: Payer: Commercial Managed Care - HMO

## 2015-06-21 DIAGNOSIS — Z0189 Encounter for other specified special examinations: Secondary | ICD-10-CM | POA: Diagnosis not present

## 2015-06-21 DIAGNOSIS — G451 Carotid artery syndrome (hemispheric): Secondary | ICD-10-CM | POA: Diagnosis not present

## 2015-07-01 DIAGNOSIS — R5381 Other malaise: Secondary | ICD-10-CM | POA: Diagnosis not present

## 2015-07-01 DIAGNOSIS — E119 Type 2 diabetes mellitus without complications: Secondary | ICD-10-CM | POA: Diagnosis not present

## 2015-07-01 DIAGNOSIS — R5383 Other fatigue: Secondary | ICD-10-CM | POA: Diagnosis not present

## 2015-07-01 DIAGNOSIS — I10 Essential (primary) hypertension: Secondary | ICD-10-CM | POA: Diagnosis not present

## 2015-07-05 ENCOUNTER — Ambulatory Visit
Admission: RE | Admit: 2015-07-05 | Discharge: 2015-07-05 | Disposition: A | Discharge status: Home / Self Care | Payer: Commercial Managed Care - HMO | Source: Ambulatory Visit | Attending: Nurse Practitioner | Admitting: Nurse Practitioner

## 2015-07-05 DIAGNOSIS — R1011 Right upper quadrant pain: Secondary | ICD-10-CM | POA: Insufficient documentation

## 2015-07-08 ENCOUNTER — Other Ambulatory Visit: Payer: Self-pay | Admitting: Nurse Practitioner

## 2015-07-08 DIAGNOSIS — R350 Frequency of micturition: Secondary | ICD-10-CM | POA: Diagnosis not present

## 2015-07-08 DIAGNOSIS — K760 Fatty (change of) liver, not elsewhere classified: Secondary | ICD-10-CM | POA: Diagnosis not present

## 2015-07-08 DIAGNOSIS — R1011 Right upper quadrant pain: Secondary | ICD-10-CM | POA: Diagnosis not present

## 2015-07-09 ENCOUNTER — Ambulatory Visit
Admission: RE | Admit: 2015-07-09 | Discharge: 2015-07-09 | Disposition: A | Discharge status: Home / Self Care | Payer: Commercial Managed Care - HMO | Source: Ambulatory Visit | Attending: Nurse Practitioner | Admitting: Nurse Practitioner

## 2015-07-09 DIAGNOSIS — R1011 Right upper quadrant pain: Secondary | ICD-10-CM | POA: Insufficient documentation

## 2015-07-09 DIAGNOSIS — K760 Fatty (change of) liver, not elsewhere classified: Secondary | ICD-10-CM | POA: Diagnosis not present

## 2015-07-09 DIAGNOSIS — J0100 Acute maxillary sinusitis, unspecified: Secondary | ICD-10-CM | POA: Diagnosis not present

## 2015-07-09 DIAGNOSIS — I10 Essential (primary) hypertension: Secondary | ICD-10-CM | POA: Diagnosis not present

## 2015-07-09 DIAGNOSIS — N289 Disorder of kidney and ureter, unspecified: Secondary | ICD-10-CM | POA: Diagnosis not present

## 2015-07-09 DIAGNOSIS — E1165 Type 2 diabetes mellitus with hyperglycemia: Secondary | ICD-10-CM | POA: Diagnosis not present

## 2015-07-30 DIAGNOSIS — K760 Fatty (change of) liver, not elsewhere classified: Secondary | ICD-10-CM | POA: Diagnosis not present

## 2015-08-06 DIAGNOSIS — I10 Essential (primary) hypertension: Secondary | ICD-10-CM | POA: Diagnosis not present

## 2015-08-06 DIAGNOSIS — N289 Disorder of kidney and ureter, unspecified: Secondary | ICD-10-CM | POA: Diagnosis not present

## 2015-08-13 DIAGNOSIS — K760 Fatty (change of) liver, not elsewhere classified: Secondary | ICD-10-CM | POA: Diagnosis not present

## 2015-08-28 DIAGNOSIS — Z23 Encounter for immunization: Secondary | ICD-10-CM | POA: Diagnosis not present

## 2015-09-13 DIAGNOSIS — F0150 Vascular dementia without behavioral disturbance: Secondary | ICD-10-CM | POA: Diagnosis not present

## 2015-09-13 DIAGNOSIS — G451 Carotid artery syndrome (hemispheric): Secondary | ICD-10-CM | POA: Diagnosis not present

## 2015-09-13 DIAGNOSIS — E65 Localized adiposity: Secondary | ICD-10-CM | POA: Diagnosis not present

## 2015-09-25 DIAGNOSIS — Z23 Encounter for immunization: Secondary | ICD-10-CM | POA: Diagnosis not present

## 2015-09-26 DIAGNOSIS — M6281 Muscle weakness (generalized): Secondary | ICD-10-CM | POA: Diagnosis not present

## 2015-09-26 DIAGNOSIS — G451 Carotid artery syndrome (hemispheric): Secondary | ICD-10-CM | POA: Diagnosis not present

## 2015-09-26 DIAGNOSIS — F0150 Vascular dementia without behavioral disturbance: Secondary | ICD-10-CM | POA: Diagnosis not present

## 2015-09-26 DIAGNOSIS — R2689 Other abnormalities of gait and mobility: Secondary | ICD-10-CM | POA: Diagnosis not present

## 2015-10-01 DIAGNOSIS — R2689 Other abnormalities of gait and mobility: Secondary | ICD-10-CM | POA: Diagnosis not present

## 2015-10-01 DIAGNOSIS — M6281 Muscle weakness (generalized): Secondary | ICD-10-CM | POA: Diagnosis not present

## 2015-10-01 DIAGNOSIS — F0150 Vascular dementia without behavioral disturbance: Secondary | ICD-10-CM | POA: Diagnosis not present

## 2015-10-01 DIAGNOSIS — G451 Carotid artery syndrome (hemispheric): Secondary | ICD-10-CM | POA: Diagnosis not present

## 2015-10-07 DIAGNOSIS — Z7982 Long term (current) use of aspirin: Secondary | ICD-10-CM | POA: Diagnosis not present

## 2015-10-07 DIAGNOSIS — E119 Type 2 diabetes mellitus without complications: Secondary | ICD-10-CM | POA: Diagnosis not present

## 2015-10-07 DIAGNOSIS — M6281 Muscle weakness (generalized): Secondary | ICD-10-CM | POA: Diagnosis not present

## 2015-10-07 DIAGNOSIS — F0150 Vascular dementia without behavioral disturbance: Secondary | ICD-10-CM | POA: Diagnosis not present

## 2015-10-07 DIAGNOSIS — I10 Essential (primary) hypertension: Secondary | ICD-10-CM | POA: Diagnosis not present

## 2015-10-07 DIAGNOSIS — Z7984 Long term (current) use of oral hypoglycemic drugs: Secondary | ICD-10-CM | POA: Diagnosis not present

## 2015-10-07 DIAGNOSIS — Z8673 Personal history of transient ischemic attack (TIA), and cerebral infarction without residual deficits: Secondary | ICD-10-CM | POA: Diagnosis not present

## 2015-10-07 DIAGNOSIS — G451 Carotid artery syndrome (hemispheric): Secondary | ICD-10-CM | POA: Diagnosis not present

## 2015-10-08 DIAGNOSIS — Z7982 Long term (current) use of aspirin: Secondary | ICD-10-CM | POA: Diagnosis not present

## 2015-10-08 DIAGNOSIS — Z8673 Personal history of transient ischemic attack (TIA), and cerebral infarction without residual deficits: Secondary | ICD-10-CM | POA: Diagnosis not present

## 2015-10-08 DIAGNOSIS — E119 Type 2 diabetes mellitus without complications: Secondary | ICD-10-CM | POA: Diagnosis not present

## 2015-10-08 DIAGNOSIS — Z7984 Long term (current) use of oral hypoglycemic drugs: Secondary | ICD-10-CM | POA: Diagnosis not present

## 2015-10-08 DIAGNOSIS — F0150 Vascular dementia without behavioral disturbance: Secondary | ICD-10-CM | POA: Diagnosis not present

## 2015-10-08 DIAGNOSIS — I10 Essential (primary) hypertension: Secondary | ICD-10-CM | POA: Diagnosis not present

## 2015-10-08 DIAGNOSIS — M6281 Muscle weakness (generalized): Secondary | ICD-10-CM | POA: Diagnosis not present

## 2015-10-08 DIAGNOSIS — G451 Carotid artery syndrome (hemispheric): Secondary | ICD-10-CM | POA: Diagnosis not present

## 2015-10-10 DIAGNOSIS — E119 Type 2 diabetes mellitus without complications: Secondary | ICD-10-CM | POA: Diagnosis not present

## 2015-10-10 DIAGNOSIS — I10 Essential (primary) hypertension: Secondary | ICD-10-CM | POA: Diagnosis not present

## 2015-10-10 DIAGNOSIS — Z7982 Long term (current) use of aspirin: Secondary | ICD-10-CM | POA: Diagnosis not present

## 2015-10-10 DIAGNOSIS — M6281 Muscle weakness (generalized): Secondary | ICD-10-CM | POA: Diagnosis not present

## 2015-10-10 DIAGNOSIS — G451 Carotid artery syndrome (hemispheric): Secondary | ICD-10-CM | POA: Diagnosis not present

## 2015-10-10 DIAGNOSIS — Z8673 Personal history of transient ischemic attack (TIA), and cerebral infarction without residual deficits: Secondary | ICD-10-CM | POA: Diagnosis not present

## 2015-10-10 DIAGNOSIS — F0150 Vascular dementia without behavioral disturbance: Secondary | ICD-10-CM | POA: Diagnosis not present

## 2015-10-10 DIAGNOSIS — Z7984 Long term (current) use of oral hypoglycemic drugs: Secondary | ICD-10-CM | POA: Diagnosis not present

## 2015-10-13 ENCOUNTER — Encounter: Payer: Self-pay | Admitting: Emergency Medicine

## 2015-10-13 ENCOUNTER — Emergency Department
Admission: EM | Admit: 2015-10-13 | Discharge: 2015-10-13 | Disposition: A | Discharge status: Home / Self Care | Payer: Commercial Managed Care - HMO | Attending: Internal Medicine | Admitting: Internal Medicine

## 2015-10-13 ENCOUNTER — Emergency Department: Payer: Commercial Managed Care - HMO

## 2015-10-13 DIAGNOSIS — R4182 Altered mental status, unspecified: Secondary | ICD-10-CM | POA: Diagnosis not present

## 2015-10-13 DIAGNOSIS — E119 Type 2 diabetes mellitus without complications: Secondary | ICD-10-CM | POA: Insufficient documentation

## 2015-10-13 DIAGNOSIS — G459 Transient cerebral ischemic attack, unspecified: Secondary | ICD-10-CM

## 2015-10-13 DIAGNOSIS — I10 Essential (primary) hypertension: Secondary | ICD-10-CM | POA: Insufficient documentation

## 2015-10-13 DIAGNOSIS — Z7984 Long term (current) use of oral hypoglycemic drugs: Secondary | ICD-10-CM | POA: Diagnosis not present

## 2015-10-13 DIAGNOSIS — Z79899 Other long term (current) drug therapy: Secondary | ICD-10-CM | POA: Diagnosis not present

## 2015-10-13 DIAGNOSIS — R410 Disorientation, unspecified: Secondary | ICD-10-CM | POA: Insufficient documentation

## 2015-10-13 DIAGNOSIS — Z7982 Long term (current) use of aspirin: Secondary | ICD-10-CM | POA: Insufficient documentation

## 2015-10-13 HISTORY — DX: Unspecified dementia, unspecified severity, without behavioral disturbance, psychotic disturbance, mood disturbance, and anxiety: F03.90

## 2015-10-13 LAB — COMPREHENSIVE METABOLIC PANEL
ALBUMIN: 4.2 g/dL (ref 3.5–5.0)
ALK PHOS: 52 U/L (ref 38–126)
ALT: 27 U/L (ref 17–63)
ANION GAP: 12 (ref 5–15)
AST: 45 U/L — AB (ref 15–41)
BUN: 23 mg/dL — AB (ref 6–20)
CALCIUM: 9.5 mg/dL (ref 8.9–10.3)
CO2: 30 mmol/L (ref 22–32)
CREATININE: 1.43 mg/dL — AB (ref 0.61–1.24)
Chloride: 99 mmol/L — ABNORMAL LOW (ref 101–111)
GFR calc Af Amer: 55 mL/min — ABNORMAL LOW (ref >60)
GFR calc non Af Amer: 47 mL/min — ABNORMAL LOW (ref >60)
GLUCOSE: 284 mg/dL — AB (ref 65–99)
Potassium: 3.6 mmol/L (ref 3.5–5.1)
SODIUM: 141 mmol/L (ref 135–145)
Total Bilirubin: 1.1 mg/dL (ref 0.3–1.2)
Total Protein: 7.8 g/dL (ref 6.5–8.1)

## 2015-10-13 LAB — DIFFERENTIAL
Basophils Absolute: 0.1 10*3/uL (ref 0–0.1)
Basophils Relative: 1 %
Eosinophils Absolute: 0.2 10*3/uL (ref 0–0.7)
Eosinophils Relative: 2 %
LYMPHS PCT: 14 %
Lymphs Abs: 1.7 10*3/uL (ref 1.0–3.6)
Monocytes Absolute: 0.9 10*3/uL (ref 0.2–1.0)
Monocytes Relative: 8 %
NEUTROS ABS: 9 10*3/uL — AB (ref 1.4–6.5)
NEUTROS PCT: 75 %

## 2015-10-13 LAB — CBC
HCT: 42.5 % (ref 40.0–52.0)
HEMOGLOBIN: 13.8 g/dL (ref 13.0–18.0)
MCH: 29.1 pg (ref 26.0–34.0)
MCHC: 32.4 g/dL (ref 32.0–36.0)
MCV: 89.9 fL (ref 80.0–100.0)
PLATELETS: 273 10*3/uL (ref 150–440)
RBC: 4.73 MIL/uL (ref 4.40–5.90)
RDW: 13.1 % (ref 11.5–14.5)
WBC: 11.8 10*3/uL — AB (ref 3.8–10.6)

## 2015-10-13 LAB — APTT: aPTT: 26 seconds (ref 24–36)

## 2015-10-13 LAB — PROTIME-INR
INR: 1.01
PROTHROMBIN TIME: 13.5 s (ref 11.4–15.0)

## 2015-10-13 LAB — TROPONIN I: Troponin I: <0.03 ng/mL (ref <0.031)

## 2015-10-13 LAB — GLUCOSE, CAPILLARY: Glucose-Capillary: 287 mg/dL — ABNORMAL HIGH (ref 65–99)

## 2015-10-13 NOTE — ED Provider Notes (Addendum)
[facility] Emergency Department Provider Note    ____________________________________________  Time seen: ~1900  I have reviewed the triage vital signs and the nursing notes.   HISTORY  Chief Complaint Altered Mental Status   History limited by: Altered Mental Status   HPI Michael Wright is a 75 y.o. male brought into the emergency department today by family because of concerns for confusion. The family that is here with him in the hospitals unsure when this started but thinks it was within the past 2 hours. They state that his confusion has also cause him to have some delay in arriving here saying that he took off all of his clothes at one point.They stated they also think he has had some trouble with walking today. They say the patient has had a small stroke once in the past this was last year and resulted in mainly confusion. The patient himself is not sure why see her in the hospital. He is unsure what year it is. He denies any headache. He denies any numbness or tingling in his arms or legs. He denies any chest pain or shortness of breath.    Past Medical History  Diagnosis Date  . Hyperlipidemia   . Anemia   . Hypertension   . Diabetes mellitus, type 2   . Kidney stones   . Cough   . BPH (benign prostatic hyperplasia)   . Gout     Patient Active Problem List   Diagnosis Date Noted  . AKI (acute kidney injury) ([place]) 05/15/2015  . Stroke ([place]) 05/15/2015  . Type 2 diabetes mellitus ([place]) 05/15/2015  . HTN (hypertension) 05/15/2015  . HLD (hyperlipidemia) 05/15/2015  . Abdominal pain, right upper quadrant 05/15/2015  . Hyperlipidemia   . Hypertension   . Anemia   . Diabetes mellitus, type 2 ([place])   . Extrinsic asthma, unspecified     Past Surgical History  Procedure Laterality Date  . Tonsillectomy    . Adenoidectomy  at age 65  . Vesicostomy    . Shoulder surgery      Current Outpatient Rx  Name  Route  Sig  Dispense   Refill  . allopurinol (ZYLOPRIM) 100 MG tablet   Oral   Take 100 mg by mouth daily.           Marland Kitchen aspirin EC 325 MG EC tablet   Oral   Take 1 tablet (325 mg total) by mouth daily.   30 tablet   0   . colchicine 0.6 MG tablet   Oral   Take 0.6 mg by mouth 2 (two) times daily as needed (for gout flares).         . finasteride (PROSCAR) 5 MG tablet   Oral   Take 5 mg by mouth daily.         Marland Kitchen gemfibrozil (LOPID) 600 MG tablet   Oral   Take 1 tablet (600 mg total) by mouth 2 (two) times daily before a meal.   60 tablet   0   . glimepiride (AMARYL) 2 MG tablet   Oral   Take 2 mg by mouth 2 (two) times daily.         Marland Kitchen losartan-hydrochlorothiazide (HYZAAR) 100-25 MG per tablet   Oral   Take 1 tablet by mouth daily.         . metFORMIN (GLUCOPHAGE) 500 MG tablet   Oral   Take 500 mg by mouth 2 (two) times daily with a meal.          .  metFORMIN (GLUCOPHAGE) 850 MG tablet   Oral   Take 850 mg by mouth daily with breakfast.         . pantoprazole (PROTONIX) 40 MG tablet   Oral   Take 1 tablet (40 mg total) by mouth daily.   30 tablet   0   . pioglitazone (ACTOS) 15 MG tablet   Oral   Take 15 mg by mouth daily.         . pravastatin (PRAVACHOL) 40 MG tablet   Oral   Take 40 mg by mouth at bedtime.            Allergies Review of patient's allergies indicates no known allergies.  Family History  Problem Relation Age of Onset  . Allergies Daughter   . Diabetes Father     Social History Social History  Substance Use Topics  . Smoking status: Never Smoker   . Smokeless tobacco: Former Systems developer    Types: [place] date: 08/31/1978  . Alcohol Use: No    Review of Systems  Constitutional: Negative for fever. Cardiovascular: Negative for chest pain. Respiratory: Negative for shortness of breath. Gastrointestinal: Negative for abdominal pain, vomiting and diarrhea. Neurological: Negative for headaches, focal weakness or  numbness.   10-point ROS otherwise negative.  ____________________________________________   PHYSICAL EXAM:  VITAL SIGNS: ED Triage Vitals  Enc Vitals Group     BP 10/13/15 1842 130/70 mmHg     Pulse Rate 10/13/15 1842 107     Resp 10/13/15 1842 18     Temp 10/13/15 1842 97.8 F (36.6 C)     Temp Source 10/13/15 1842 Oral     SpO2 10/13/15 1842 96 %     Weight 10/13/15 1856 224 lb (101.606 kg)     Height 10/13/15 1842 5\' 7"  (1.702 m)   Constitutional: Awake and alert, not completely oriented. In no distress. Eyes: Conjunctivae are normal. PERRL. Normal extraocular movements. ENT   Head: Normocephalic and atraumatic.   Nose: No congestion/rhinnorhea.   Mouth/Throat: Mucous membranes are moist.   Neck: No stridor. Hematological/Lymphatic/Immunilogical: No cervical lymphadenopathy. Cardiovascular: Normal rate, regular rhythm.  No murmurs, rubs, or gallops. Respiratory: Normal respiratory effort without tachypnea nor retractions. Breath sounds are clear and equal bilaterally. No wheezes/rales/rhonchi. Gastrointestinal: Soft and nontender. No distention. There is no CVA tenderness. Genitourinary: Deferred Musculoskeletal: Normal range of motion in all extremities. No joint effusions.  No lower extremity tenderness nor edema. Neurologic:  Awake and alert. Not oriented to events or year. Oriented to place. Face symmetric. Tongue midline. Patient with poor ability to follow commands to follow finger however it appears that all soccer motions are intact. Strength 5 out of 5 in grip. No drift. Sensation grossly intact in the upper extremities. Patient without any drift in the lower extremities. Sensation intact.. No gross focal neurologic deficits are appreciated.  Skin:  Skin is warm, dry and intact. No rash noted. Psychiatric: Mood and affect are normal. Speech and behavior are normal. Patient exhibits appropriate insight and  judgment.  ____________________________________________    LABS (pertinent positives/negatives)  Labs Reviewed  CBC - Abnormal; Notable for the following:    WBC 11.8 (*)    All other components within normal limits  DIFFERENTIAL - Abnormal; Notable for the following:    Neutro Abs 9.0 (*)    All other components within normal limits  COMPREHENSIVE METABOLIC PANEL - Abnormal; Notable for the following:    Chloride 99 (*)    Glucose,  Bld 284 (*)    BUN 23 (*)    Creatinine, Ser 1.43 (*)    AST 45 (*)    GFR calc non Af Amer 47 (*)    GFR calc Af Amer 55 (*)    All other components within normal limits  GLUCOSE, CAPILLARY - Abnormal; Notable for the following:    Glucose-Capillary 287 (*)    All other components within normal limits  PROTIME-INR  APTT  TROPONIN I  I-STAT TROPOININ, ED  CBG MONITORING, ED  I-STAT CHEM 8, ED     ____________________________________________   EKG  I, Nance Pear, attending physician, personally viewed and interpreted this EKG  EKG Time: 1919 Rate: 104 Rhythm: sinus tachycardia Axis: left axis deviation Intervals: qtc 484 QRS: RBBB, LAFB ST changes: no st elevation Impression: abnormal ekg   ____________________________________________    RADIOLOGY  CT head FINDINGS: There is moderate age-related atrophy and chronic microvascular ischemic changes. There is a focal area of apparent mild loss of gray-white matter discrimination in the left occipital lobe (series 2, image 18) which may be subacute or chronic. No acute intracranial hemorrhage noted. There is no mass effect or midline shift.   ____________________________________________   PROCEDURES  Procedure(s) performed: None  Critical Care performed: Yes, see critical care note(s)  CRITICAL CARE Performed by: Nance Pear   Total critical care time: 35 minutes  Critical care time was exclusive of separately billable procedures and treating other  patients.  Critical care was necessary to treat or prevent imminent or life-threatening deterioration.  Critical care was time spent personally by me on the following activities: development of treatment plan with patient and/or surrogate as well as nursing, discussions with consultants, evaluation of patient's response to treatment, examination of patient, obtaining history from patient or surrogate, ordering and performing treatments and interventions, ordering and review of laboratory studies, ordering and review of radiographic studies, pulse oximetry and re-evaluation of patient's condition.  ____________________________________________   INITIAL IMPRESSION / ASSESSMENT AND PLAN / ED COURSE  Pertinent labs & imaging results that were available during my care of the patient were reviewed by me and considered in my medical decision making (see chart for details).  Patient presents to the emergency department today after episode of confusion, difficulty speaking and possible difficulty with walking. He was called a code stroke. On my initial exam patient did have some continued confusion however otherwise no focal neuro deficits. Patient was seen by the specialist on-call neurologist. During his exam the neurologist felt that the patient was back to baseline and thus the decision was made not to give TPA. The patient will be admitted to the hospitalist service for further TIA workup and evaluation.  ----------------------------------------- 9:27 PM on 10/13/2015 -----------------------------------------  In the hospitalist went to evaluate the patient he stated he did not want to be admitted to the hospital. I then had a long discussion with the patient and family members. I did discuss with the patient are concerned that he might of had a TIA or mini stroke, which could be a sign for a future stroke. Patient did verbalize understanding of this. I did discuss with the patient had a true stroke  could lead to death or permanent neurologic dysfunction. The patient did verbalize understanding of this. Did discuss with patient that he should return for any new or concerning symptoms. I did discuss with the patient that he should contact his neurologist, Dr. Manuella Ghazi in the morning.  ____________________________________________   FINAL CLINICAL IMPRESSION(S) /  ED DIAGNOSES  Final diagnoses:  Transient cerebral ischemia, unspecified transient cerebral ischemia type  Confusion     Nance Pear, MD 10/13/15 2033  Nance Pear, MD 10/13/15 2128

## 2015-10-13 NOTE — ED Notes (Addendum)
Daughter reports she thinks it has been with in the last 2 hours. Family reports confusion and difficulty walking. Pt does not know his height, weight or what year it is. Pt able to tell nurse his name and birthday. Speech clear.

## 2015-10-13 NOTE — ED Notes (Signed)
Pt discharged despite being advised to be admitted for observation. Swallow tests and additional neuro checks were refused by pt.

## 2015-10-13 NOTE — Discharge Instructions (Signed)
Please seek medical attention for any high fevers, chest pain, shortness of breath, change in behavior, persistent vomiting, bloody stool or any other new or concerning symptoms.   Transient Ischemic Attack A transient ischemic attack (TIA) is a "warning stroke" that causes stroke-like symptoms. A TIA does not cause lasting damage to the brain. The symptoms of a TIA can happen fast and do not last long. It is important to know the symptoms of a TIA and what to do. This can help prevent stroke or death.  HOME CARE   Take medicines only as told by your doctor. Make sure you understand all of the instructions.  You may need to take aspirin or warfarin medicine. Warfarin needs to be taken exactly as told.  Taking too much or too little warfarin is dangerous. Blood tests must be done as often as told by your doctor. A PT blood test measures how long it takes for blood to clot. Your PT is used to calculate another value called an INR. Your PT and INR help your doctor adjust your warfarin dosage. He or she will make sure you are taking the right amount.  Food can cause problems with warfarin and affect the results of your blood tests. This is true for foods high in vitamin K. Eat the same amount of foods high in vitamin K each day. Foods high in vitamin K include spinach, kale, broccoli, cabbage, collard and turnip greens, Brussels sprouts, peas, cauliflower, seaweed, and parsley. Other foods high in vitamin K include beef and pork liver, green tea, and soybean oil. Eat the same amount of foods high in vitamin K each day. Avoid big changes in your diet. Tell your doctor before changing your diet. Talk to a food specialist (dietitian) if you have questions.  Many medicines can cause problems with warfarin and affect your PT and INR. Tell your doctor about all medicines you take. This includes vitamins and dietary pills (supplements). Do not take or stop taking any prescribed or over-the-counter medicines  unless your doctor tells you to.  Warfarin can cause more bruising or bleeding. Hold pressure over any cuts for longer than normal. Talk to your doctor about other side effects of warfarin.  Avoid sports or activities that may cause injury or bleeding.  Be careful when you shave, floss, or use sharp objects.  Avoid or drink very little alcohol while taking warfarin. Tell your doctor if you change how much alcohol you drink.  Tell your dentist and other doctors that you take warfarin before any procedures.  Follow your diet program as told, if you are given one.  Keep a healthy weight.  Stay active. Try to get at least 30 minutes of activity on all or most days.  Do not use any tobacco products, including cigarettes, chewing tobacco, or electronic cigarettes. If you need help quitting, ask your doctor.  Limit alcohol intake to no more than 1 drink per day for nonpregnant women and 2 drinks per day for men. One drink equals 12 ounces of beer, 5 ounces of wine, or 1 ounces of hard liquor.  Do not abuse drugs.  Keep your home safe so you do not fall. You can do this by:  Putting grab bars in the bedroom and bathroom.  Raising toilet seats.  Putting a seat in the shower.  Keep all follow-up visits as told by your doctor. This is important. GET HELP IF:  Your personality changes.  You have trouble swallowing.  You have double  vision.  You are dizzy.  You have a fever. GET HELP RIGHT AWAY IF:  These symptoms may be an emergency. Do not wait to see if the symptoms will go away. Get medical help right away. Call your local emergency services (911 in the [place] Do not drive yourself to the hospital.  You have sudden weakness or lose feeling (go numb), especially on one side of the body. This can affect your:  Face.  Arm.  Leg.  You have sudden trouble walking.  You have sudden trouble moving your arms or legs.  You have sudden confusion.  You have trouble  talking.  You have trouble understanding.  You have sudden trouble seeing in one or both eyes.  You lose your balance.  Your movements are not smooth.  You have a sudden, very bad headache with no known cause.  You have new chest pain.  Your heartbeat is unsteady.  You are partly or totally unaware of what is going on around you. MAKE SURE YOU:   Understand these instructions.  Will watch your condition.  Will get help right away if you are not doing well or get worse.   This information is not intended to replace advice given to you by your health care provider. Make sure you discuss any questions you have with your health care provider.   Document Released: 05/26/2008 Document Revised: 09/07/2014 Document Reviewed: 11/22/2013 Elsevier Interactive Patient Education [employer].

## 2015-10-13 NOTE — ED Notes (Signed)
Pt verbalized understanding of discharge instructions despite being advised that admission was recommended. NAD at this time.

## 2015-10-15 DIAGNOSIS — I10 Essential (primary) hypertension: Secondary | ICD-10-CM | POA: Diagnosis not present

## 2015-10-15 DIAGNOSIS — Z7982 Long term (current) use of aspirin: Secondary | ICD-10-CM | POA: Diagnosis not present

## 2015-10-15 DIAGNOSIS — M6281 Muscle weakness (generalized): Secondary | ICD-10-CM | POA: Diagnosis not present

## 2015-10-15 DIAGNOSIS — Z8673 Personal history of transient ischemic attack (TIA), and cerebral infarction without residual deficits: Secondary | ICD-10-CM | POA: Diagnosis not present

## 2015-10-15 DIAGNOSIS — E119 Type 2 diabetes mellitus without complications: Secondary | ICD-10-CM | POA: Diagnosis not present

## 2015-10-15 DIAGNOSIS — Z7984 Long term (current) use of oral hypoglycemic drugs: Secondary | ICD-10-CM | POA: Diagnosis not present

## 2015-10-15 DIAGNOSIS — F0150 Vascular dementia without behavioral disturbance: Secondary | ICD-10-CM | POA: Diagnosis not present

## 2015-10-15 DIAGNOSIS — G451 Carotid artery syndrome (hemispheric): Secondary | ICD-10-CM | POA: Diagnosis not present

## 2015-10-16 DIAGNOSIS — G451 Carotid artery syndrome (hemispheric): Secondary | ICD-10-CM | POA: Diagnosis not present

## 2015-10-16 DIAGNOSIS — Z7984 Long term (current) use of oral hypoglycemic drugs: Secondary | ICD-10-CM | POA: Diagnosis not present

## 2015-10-16 DIAGNOSIS — E119 Type 2 diabetes mellitus without complications: Secondary | ICD-10-CM | POA: Diagnosis not present

## 2015-10-16 DIAGNOSIS — I10 Essential (primary) hypertension: Secondary | ICD-10-CM | POA: Diagnosis not present

## 2015-10-16 DIAGNOSIS — F0150 Vascular dementia without behavioral disturbance: Secondary | ICD-10-CM | POA: Diagnosis not present

## 2015-10-16 DIAGNOSIS — M6281 Muscle weakness (generalized): Secondary | ICD-10-CM | POA: Diagnosis not present

## 2015-10-16 DIAGNOSIS — Z8673 Personal history of transient ischemic attack (TIA), and cerebral infarction without residual deficits: Secondary | ICD-10-CM | POA: Diagnosis not present

## 2015-10-16 DIAGNOSIS — Z7982 Long term (current) use of aspirin: Secondary | ICD-10-CM | POA: Diagnosis not present

## 2015-10-21 DIAGNOSIS — G459 Transient cerebral ischemic attack, unspecified: Secondary | ICD-10-CM | POA: Diagnosis not present

## 2015-11-06 DIAGNOSIS — I679 Cerebrovascular disease, unspecified: Secondary | ICD-10-CM | POA: Diagnosis not present

## 2015-11-06 DIAGNOSIS — E1121 Type 2 diabetes mellitus with diabetic nephropathy: Secondary | ICD-10-CM | POA: Diagnosis not present

## 2015-11-06 DIAGNOSIS — I10 Essential (primary) hypertension: Secondary | ICD-10-CM | POA: Diagnosis not present

## 2016-01-29 DIAGNOSIS — E119 Type 2 diabetes mellitus without complications: Secondary | ICD-10-CM | POA: Diagnosis not present

## 2016-03-09 DIAGNOSIS — I10 Essential (primary) hypertension: Secondary | ICD-10-CM | POA: Diagnosis not present

## 2016-03-09 DIAGNOSIS — E119 Type 2 diabetes mellitus without complications: Secondary | ICD-10-CM | POA: Diagnosis not present

## 2016-03-09 DIAGNOSIS — F0150 Vascular dementia without behavioral disturbance: Secondary | ICD-10-CM | POA: Diagnosis not present

## 2016-03-09 DIAGNOSIS — E785 Hyperlipidemia, unspecified: Secondary | ICD-10-CM | POA: Diagnosis not present

## 2016-07-13 DIAGNOSIS — I10 Essential (primary) hypertension: Secondary | ICD-10-CM | POA: Diagnosis not present

## 2016-07-13 DIAGNOSIS — M109 Gout, unspecified: Secondary | ICD-10-CM | POA: Diagnosis not present

## 2016-07-13 DIAGNOSIS — E119 Type 2 diabetes mellitus without complications: Secondary | ICD-10-CM | POA: Diagnosis not present

## 2016-07-13 DIAGNOSIS — I679 Cerebrovascular disease, unspecified: Secondary | ICD-10-CM | POA: Diagnosis not present

## 2016-10-12 DIAGNOSIS — B351 Tinea unguium: Secondary | ICD-10-CM | POA: Diagnosis not present

## 2016-10-12 DIAGNOSIS — E119 Type 2 diabetes mellitus without complications: Secondary | ICD-10-CM | POA: Diagnosis not present

## 2016-11-06 DIAGNOSIS — E119 Type 2 diabetes mellitus without complications: Secondary | ICD-10-CM | POA: Diagnosis not present

## 2016-12-31 DIAGNOSIS — E1129 Type 2 diabetes mellitus with other diabetic kidney complication: Secondary | ICD-10-CM | POA: Diagnosis not present

## 2016-12-31 DIAGNOSIS — M25562 Pain in left knee: Secondary | ICD-10-CM | POA: Diagnosis not present

## 2016-12-31 DIAGNOSIS — M25561 Pain in right knee: Secondary | ICD-10-CM | POA: Diagnosis not present

## 2017-02-01 DIAGNOSIS — E538 Deficiency of other specified B group vitamins: Secondary | ICD-10-CM | POA: Diagnosis not present

## 2017-02-01 DIAGNOSIS — E1121 Type 2 diabetes mellitus with diabetic nephropathy: Secondary | ICD-10-CM | POA: Diagnosis not present

## 2017-02-01 DIAGNOSIS — N183 Chronic kidney disease, stage 3 (moderate): Secondary | ICD-10-CM | POA: Diagnosis not present

## 2017-02-01 DIAGNOSIS — J069 Acute upper respiratory infection, unspecified: Secondary | ICD-10-CM | POA: Diagnosis not present

## 2017-02-01 DIAGNOSIS — I10 Essential (primary) hypertension: Secondary | ICD-10-CM | POA: Diagnosis not present

## 2017-02-01 DIAGNOSIS — R29898 Other symptoms and signs involving the musculoskeletal system: Secondary | ICD-10-CM | POA: Diagnosis not present

## 2017-02-16 DIAGNOSIS — M25562 Pain in left knee: Secondary | ICD-10-CM | POA: Diagnosis not present

## 2017-02-16 DIAGNOSIS — E119 Type 2 diabetes mellitus without complications: Secondary | ICD-10-CM | POA: Diagnosis not present

## 2017-02-16 DIAGNOSIS — E65 Localized adiposity: Secondary | ICD-10-CM | POA: Diagnosis not present

## 2017-02-16 DIAGNOSIS — G8929 Other chronic pain: Secondary | ICD-10-CM | POA: Diagnosis not present

## 2017-02-16 DIAGNOSIS — R29898 Other symptoms and signs involving the musculoskeletal system: Secondary | ICD-10-CM | POA: Diagnosis not present

## 2017-02-16 DIAGNOSIS — M5440 Lumbago with sciatica, unspecified side: Secondary | ICD-10-CM | POA: Diagnosis not present

## 2017-03-15 DIAGNOSIS — G629 Polyneuropathy, unspecified: Secondary | ICD-10-CM | POA: Diagnosis not present

## 2017-04-21 DIAGNOSIS — K006 Disturbances in tooth eruption: Secondary | ICD-10-CM | POA: Diagnosis not present

## 2017-05-04 DIAGNOSIS — M5440 Lumbago with sciatica, unspecified side: Secondary | ICD-10-CM | POA: Diagnosis not present

## 2017-05-04 DIAGNOSIS — E1165 Type 2 diabetes mellitus with hyperglycemia: Secondary | ICD-10-CM | POA: Diagnosis not present

## 2017-05-04 DIAGNOSIS — Z636 Dependent relative needing care at home: Secondary | ICD-10-CM | POA: Diagnosis not present

## 2017-05-04 DIAGNOSIS — I10 Essential (primary) hypertension: Secondary | ICD-10-CM | POA: Diagnosis not present

## 2017-05-04 DIAGNOSIS — E538 Deficiency of other specified B group vitamins: Secondary | ICD-10-CM | POA: Diagnosis not present

## 2017-06-11 DIAGNOSIS — R7989 Other specified abnormal findings of blood chemistry: Secondary | ICD-10-CM | POA: Diagnosis not present

## 2017-08-03 DIAGNOSIS — J069 Acute upper respiratory infection, unspecified: Secondary | ICD-10-CM | POA: Diagnosis not present

## 2017-08-03 DIAGNOSIS — E538 Deficiency of other specified B group vitamins: Secondary | ICD-10-CM | POA: Diagnosis not present

## 2017-08-03 DIAGNOSIS — E1165 Type 2 diabetes mellitus with hyperglycemia: Secondary | ICD-10-CM | POA: Diagnosis not present

## 2017-08-03 DIAGNOSIS — I10 Essential (primary) hypertension: Secondary | ICD-10-CM | POA: Diagnosis not present

## 2017-11-09 DIAGNOSIS — Z8739 Personal history of other diseases of the musculoskeletal system and connective tissue: Secondary | ICD-10-CM | POA: Diagnosis not present

## 2017-11-09 DIAGNOSIS — M25561 Pain in right knee: Secondary | ICD-10-CM | POA: Diagnosis not present

## 2017-11-15 DIAGNOSIS — G6289 Other specified polyneuropathies: Secondary | ICD-10-CM | POA: Diagnosis not present

## 2017-11-15 DIAGNOSIS — M25561 Pain in right knee: Secondary | ICD-10-CM | POA: Diagnosis not present

## 2017-12-06 DIAGNOSIS — R05 Cough: Secondary | ICD-10-CM | POA: Diagnosis not present

## 2017-12-06 DIAGNOSIS — M25561 Pain in right knee: Secondary | ICD-10-CM | POA: Diagnosis not present

## 2017-12-06 DIAGNOSIS — I10 Essential (primary) hypertension: Secondary | ICD-10-CM | POA: Diagnosis not present

## 2017-12-06 DIAGNOSIS — E1129 Type 2 diabetes mellitus with other diabetic kidney complication: Secondary | ICD-10-CM | POA: Diagnosis not present

## 2017-12-06 DIAGNOSIS — E1165 Type 2 diabetes mellitus with hyperglycemia: Secondary | ICD-10-CM | POA: Diagnosis not present

## 2017-12-06 DIAGNOSIS — J019 Acute sinusitis, unspecified: Secondary | ICD-10-CM | POA: Diagnosis not present

## 2017-12-06 DIAGNOSIS — Z0000 Encounter for general adult medical examination without abnormal findings: Secondary | ICD-10-CM | POA: Diagnosis not present

## 2017-12-06 DIAGNOSIS — G629 Polyneuropathy, unspecified: Secondary | ICD-10-CM | POA: Diagnosis not present

## 2017-12-06 DIAGNOSIS — R197 Diarrhea, unspecified: Secondary | ICD-10-CM | POA: Diagnosis not present

## 2017-12-10 DIAGNOSIS — E119 Type 2 diabetes mellitus without complications: Secondary | ICD-10-CM | POA: Diagnosis not present

## 2017-12-10 DIAGNOSIS — M2391 Unspecified internal derangement of right knee: Secondary | ICD-10-CM | POA: Diagnosis not present

## 2017-12-10 DIAGNOSIS — M2351 Chronic instability of knee, right knee: Secondary | ICD-10-CM | POA: Diagnosis not present

## 2017-12-10 DIAGNOSIS — E669 Obesity, unspecified: Secondary | ICD-10-CM | POA: Diagnosis not present

## 2018-03-22 DIAGNOSIS — H2513 Age-related nuclear cataract, bilateral: Secondary | ICD-10-CM | POA: Diagnosis not present

## 2018-04-04 DIAGNOSIS — R0602 Shortness of breath: Secondary | ICD-10-CM | POA: Diagnosis not present

## 2018-04-04 DIAGNOSIS — F0150 Vascular dementia without behavioral disturbance: Secondary | ICD-10-CM | POA: Diagnosis not present

## 2018-04-19 ENCOUNTER — Inpatient Hospital Stay
Admission: EM | Admit: 2018-04-19 | Discharge: 2018-04-26 | DRG: 291 | Disposition: A | Discharge status: Home Health | Payer: Medicare HMO | Attending: Internal Medicine | Admitting: Internal Medicine

## 2018-04-19 ENCOUNTER — Emergency Department: Payer: Medicare HMO

## 2018-04-19 ENCOUNTER — Other Ambulatory Visit: Payer: Self-pay

## 2018-04-19 DIAGNOSIS — G9340 Encephalopathy, unspecified: Secondary | ICD-10-CM

## 2018-04-19 DIAGNOSIS — N183 Chronic kidney disease, stage 3 (moderate): Secondary | ICD-10-CM | POA: Diagnosis present

## 2018-04-19 DIAGNOSIS — R0602 Shortness of breath: Secondary | ICD-10-CM | POA: Diagnosis not present

## 2018-04-19 DIAGNOSIS — R531 Weakness: Secondary | ICD-10-CM | POA: Diagnosis present

## 2018-04-19 DIAGNOSIS — Z87442 Personal history of urinary calculi: Secondary | ICD-10-CM

## 2018-04-19 DIAGNOSIS — E1122 Type 2 diabetes mellitus with diabetic chronic kidney disease: Secondary | ICD-10-CM | POA: Diagnosis present

## 2018-04-19 DIAGNOSIS — N400 Enlarged prostate without lower urinary tract symptoms: Secondary | ICD-10-CM | POA: Diagnosis present

## 2018-04-19 DIAGNOSIS — Z7982 Long term (current) use of aspirin: Secondary | ICD-10-CM

## 2018-04-19 DIAGNOSIS — J440 Chronic obstructive pulmonary disease with acute lower respiratory infection: Secondary | ICD-10-CM | POA: Diagnosis not present

## 2018-04-19 DIAGNOSIS — I5033 Acute on chronic diastolic (congestive) heart failure: Secondary | ICD-10-CM | POA: Diagnosis not present

## 2018-04-19 DIAGNOSIS — N179 Acute kidney failure, unspecified: Secondary | ICD-10-CM | POA: Diagnosis not present

## 2018-04-19 DIAGNOSIS — Z9981 Dependence on supplemental oxygen: Secondary | ICD-10-CM | POA: Diagnosis not present

## 2018-04-19 DIAGNOSIS — Z833 Family history of diabetes mellitus: Secondary | ICD-10-CM | POA: Diagnosis not present

## 2018-04-19 DIAGNOSIS — G9341 Metabolic encephalopathy: Secondary | ICD-10-CM | POA: Diagnosis present

## 2018-04-19 DIAGNOSIS — E119 Type 2 diabetes mellitus without complications: Secondary | ICD-10-CM | POA: Diagnosis not present

## 2018-04-19 DIAGNOSIS — E785 Hyperlipidemia, unspecified: Secondary | ICD-10-CM | POA: Diagnosis present

## 2018-04-19 DIAGNOSIS — E872 Acidosis: Secondary | ICD-10-CM | POA: Diagnosis present

## 2018-04-19 DIAGNOSIS — F0390 Unspecified dementia without behavioral disturbance: Secondary | ICD-10-CM | POA: Diagnosis present

## 2018-04-19 DIAGNOSIS — T502X5A Adverse effect of carbonic-anhydrase inhibitors, benzothiadiazides and other diuretics, initial encounter: Secondary | ICD-10-CM | POA: Diagnosis not present

## 2018-04-19 DIAGNOSIS — Z6837 Body mass index (BMI) 37.0-37.9, adult: Secondary | ICD-10-CM | POA: Diagnosis not present

## 2018-04-19 DIAGNOSIS — I272 Pulmonary hypertension, unspecified: Secondary | ICD-10-CM | POA: Diagnosis present

## 2018-04-19 DIAGNOSIS — J810 Acute pulmonary edema: Secondary | ICD-10-CM

## 2018-04-19 DIAGNOSIS — I509 Heart failure, unspecified: Secondary | ICD-10-CM | POA: Diagnosis not present

## 2018-04-19 DIAGNOSIS — I4510 Unspecified right bundle-branch block: Secondary | ICD-10-CM | POA: Diagnosis present

## 2018-04-19 DIAGNOSIS — I248 Other forms of acute ischemic heart disease: Secondary | ICD-10-CM | POA: Diagnosis not present

## 2018-04-19 DIAGNOSIS — R4587 Impulsiveness: Secondary | ICD-10-CM | POA: Diagnosis present

## 2018-04-19 DIAGNOSIS — I130 Hypertensive heart and chronic kidney disease with heart failure and stage 1 through stage 4 chronic kidney disease, or unspecified chronic kidney disease: Secondary | ICD-10-CM | POA: Diagnosis not present

## 2018-04-19 DIAGNOSIS — E1165 Type 2 diabetes mellitus with hyperglycemia: Secondary | ICD-10-CM | POA: Diagnosis present

## 2018-04-19 DIAGNOSIS — E662 Morbid (severe) obesity with alveolar hypoventilation: Secondary | ICD-10-CM | POA: Diagnosis not present

## 2018-04-19 DIAGNOSIS — J9601 Acute respiratory failure with hypoxia: Secondary | ICD-10-CM | POA: Diagnosis not present

## 2018-04-19 DIAGNOSIS — Z7984 Long term (current) use of oral hypoglycemic drugs: Secondary | ICD-10-CM | POA: Diagnosis not present

## 2018-04-19 DIAGNOSIS — R4182 Altered mental status, unspecified: Secondary | ICD-10-CM | POA: Diagnosis not present

## 2018-04-19 DIAGNOSIS — T380X5A Adverse effect of glucocorticoids and synthetic analogues, initial encounter: Secondary | ICD-10-CM | POA: Diagnosis not present

## 2018-04-19 DIAGNOSIS — Z9089 Acquired absence of other organs: Secondary | ICD-10-CM

## 2018-04-19 DIAGNOSIS — R0689 Other abnormalities of breathing: Secondary | ICD-10-CM | POA: Diagnosis not present

## 2018-04-19 DIAGNOSIS — I5032 Chronic diastolic (congestive) heart failure: Secondary | ICD-10-CM

## 2018-04-19 DIAGNOSIS — J9602 Acute respiratory failure with hypercapnia: Secondary | ICD-10-CM | POA: Diagnosis present

## 2018-04-19 DIAGNOSIS — J45909 Unspecified asthma, uncomplicated: Secondary | ICD-10-CM | POA: Diagnosis not present

## 2018-04-19 DIAGNOSIS — I959 Hypotension, unspecified: Secondary | ICD-10-CM | POA: Diagnosis not present

## 2018-04-19 HISTORY — DX: Chronic diastolic (congestive) heart failure: I50.32

## 2018-04-19 LAB — CBC
HEMATOCRIT: 46.3 % (ref 40.0–52.0)
Hemoglobin: 15 g/dL (ref 13.0–18.0)
MCH: 30.5 pg (ref 26.0–34.0)
MCHC: 32.4 g/dL (ref 32.0–36.0)
MCV: 94 fL (ref 80.0–100.0)
Platelets: 236 10*3/uL (ref 150–440)
RBC: 4.93 MIL/uL (ref 4.40–5.90)
RDW: 13.9 % (ref 11.5–14.5)
WBC: 13.9 10*3/uL — AB (ref 3.8–10.6)

## 2018-04-19 LAB — GLUCOSE, CAPILLARY: Glucose-Capillary: 239 mg/dL — ABNORMAL HIGH (ref 70–99)

## 2018-04-19 LAB — BRAIN NATRIURETIC PEPTIDE: B Natriuretic Peptide: 115 pg/mL — ABNORMAL HIGH (ref 0.0–100.0)

## 2018-04-19 LAB — TROPONIN I
TROPONIN I: 0.11 ng/mL — AB (ref <0.03)
Troponin I: 0.08 ng/mL (ref <0.03)

## 2018-04-19 LAB — HEPATIC FUNCTION PANEL
ALT: 22 U/L (ref 0–44)
AST: 25 U/L (ref 15–41)
Albumin: 3.5 g/dL (ref 3.5–5.0)
Alkaline Phosphatase: 56 U/L (ref 38–126)
BILIRUBIN DIRECT: 0.3 mg/dL — AB (ref 0.0–0.2)
BILIRUBIN INDIRECT: 1.1 mg/dL — AB (ref 0.3–0.9)
TOTAL PROTEIN: 7.3 g/dL (ref 6.5–8.1)
Total Bilirubin: 1.4 mg/dL — ABNORMAL HIGH (ref 0.3–1.2)

## 2018-04-19 LAB — BASIC METABOLIC PANEL
ANION GAP: 11 (ref 5–15)
BUN: 22 mg/dL (ref 8–23)
CALCIUM: 8.7 mg/dL — AB (ref 8.9–10.3)
CO2: 29 mmol/L (ref 22–32)
Chloride: 97 mmol/L — ABNORMAL LOW (ref 98–111)
Creatinine, Ser: 1.46 mg/dL — ABNORMAL HIGH (ref 0.61–1.24)
GFR calc Af Amer: 52 mL/min — ABNORMAL LOW (ref >60)
GFR calc non Af Amer: 45 mL/min — ABNORMAL LOW (ref >60)
GLUCOSE: 453 mg/dL — AB (ref 70–99)
Potassium: 3.8 mmol/L (ref 3.5–5.1)
Sodium: 137 mmol/L (ref 135–145)

## 2018-04-19 LAB — HEMOGLOBIN A1C
HEMOGLOBIN A1C: 10.7 % — AB (ref 4.8–5.6)
MEAN PLASMA GLUCOSE: 260.39 mg/dL

## 2018-04-19 LAB — LACTIC ACID, PLASMA: LACTIC ACID, VENOUS: 2.3 mmol/L — AB (ref 0.5–1.9)

## 2018-04-19 MED ORDER — GEMFIBROZIL 600 MG PO TABS
600.0000 mg | ORAL_TABLET | Freq: Two times a day (BID) | ORAL | Status: DC
  Administered 2018-04-20 – 2018-04-26 (×13): 600 mg via ORAL
  Filled 2018-04-19 (×14): qty 1

## 2018-04-19 MED ORDER — IPRATROPIUM-ALBUTEROL 0.5-2.5 (3) MG/3ML IN SOLN
3.0000 mL | Freq: Once | RESPIRATORY_TRACT | Status: AC
  Administered 2018-04-19: 3 mL via RESPIRATORY_TRACT
  Filled 2018-04-19: qty 3

## 2018-04-19 MED ORDER — ASPIRIN EC 325 MG PO TBEC
325.0000 mg | DELAYED_RELEASE_TABLET | Freq: Every day | ORAL | Status: DC
  Administered 2018-04-20 – 2018-04-26 (×7): 325 mg via ORAL
  Filled 2018-04-19 (×7): qty 1

## 2018-04-19 MED ORDER — METHYLPREDNISOLONE SODIUM SUCC 125 MG IJ SOLR
125.0000 mg | Freq: Once | INTRAMUSCULAR | Status: AC
  Administered 2018-04-19: 125 mg via INTRAVENOUS
  Filled 2018-04-19: qty 2

## 2018-04-19 MED ORDER — COLCHICINE 0.6 MG PO TABS
0.6000 mg | ORAL_TABLET | Freq: Two times a day (BID) | ORAL | Status: DC | PRN
  Filled 2018-04-19: qty 1

## 2018-04-19 MED ORDER — PRAVASTATIN SODIUM 20 MG PO TABS
40.0000 mg | ORAL_TABLET | Freq: Every day | ORAL | Status: DC
  Administered 2018-04-20 – 2018-04-25 (×6): 40 mg via ORAL
  Filled 2018-04-19 (×4): qty 2
  Filled 2018-04-19 (×2): qty 1
  Filled 2018-04-19 (×4): qty 2

## 2018-04-19 MED ORDER — BUDESONIDE 0.5 MG/2ML IN SUSP
0.5000 mg | Freq: Two times a day (BID) | RESPIRATORY_TRACT | Status: DC
  Administered 2018-04-19 – 2018-04-26 (×14): 0.5 mg via RESPIRATORY_TRACT
  Filled 2018-04-19 (×14): qty 2

## 2018-04-19 MED ORDER — INSULIN ASPART 100 UNIT/ML ~~LOC~~ SOLN
0.0000 [IU] | Freq: Three times a day (TID) | SUBCUTANEOUS | Status: DC
  Administered 2018-04-20: 9 [IU] via SUBCUTANEOUS
  Filled 2018-04-19: qty 1

## 2018-04-19 MED ORDER — FUROSEMIDE 10 MG/ML IJ SOLN
40.0000 mg | Freq: Two times a day (BID) | INTRAMUSCULAR | Status: DC
  Administered 2018-04-20: 40 mg via INTRAVENOUS
  Filled 2018-04-19: qty 4

## 2018-04-19 MED ORDER — IPRATROPIUM-ALBUTEROL 0.5-2.5 (3) MG/3ML IN SOLN
3.0000 mL | Freq: Four times a day (QID) | RESPIRATORY_TRACT | Status: DC
  Administered 2018-04-19 – 2018-04-24 (×21): 3 mL via RESPIRATORY_TRACT
  Filled 2018-04-19 (×21): qty 3

## 2018-04-19 MED ORDER — FUROSEMIDE 10 MG/ML IJ SOLN: 60.0000 mg | Freq: Once | INTRAMUSCULAR | Status: DC

## 2018-04-19 MED ORDER — CHLORHEXIDINE GLUCONATE 0.12 % MT SOLN
15.0000 mL | Freq: Two times a day (BID) | OROMUCOSAL | Status: DC
  Administered 2018-04-19 – 2018-04-26 (×14): 15 mL via OROMUCOSAL
  Filled 2018-04-19 (×14): qty 15

## 2018-04-19 MED ORDER — FINASTERIDE 5 MG PO TABS
5.0000 mg | ORAL_TABLET | Freq: Every day | ORAL | Status: DC
  Administered 2018-04-20 – 2018-04-26 (×7): 5 mg via ORAL
  Filled 2018-04-19 (×8): qty 1

## 2018-04-19 MED ORDER — INSULIN ASPART 100 UNIT/ML ~~LOC~~ SOLN
0.0000 [IU] | Freq: Every day | SUBCUTANEOUS | Status: DC
  Administered 2018-04-19: 2 [IU] via SUBCUTANEOUS
  Filled 2018-04-19: qty 1

## 2018-04-19 MED ORDER — LOSARTAN POTASSIUM 50 MG PO TABS
100.0000 mg | ORAL_TABLET | Freq: Every day | ORAL | Status: DC
  Administered 2018-04-20: 100 mg via ORAL
  Filled 2018-04-19: qty 2

## 2018-04-19 MED ORDER — CARVEDILOL 3.125 MG PO TABS
3.1250 mg | ORAL_TABLET | Freq: Two times a day (BID) | ORAL | Status: DC
  Administered 2018-04-20 – 2018-04-21 (×2): 3.125 mg via ORAL
  Filled 2018-04-19 (×2): qty 1

## 2018-04-19 MED ORDER — ALLOPURINOL 100 MG PO TABS
100.0000 mg | ORAL_TABLET | Freq: Every day | ORAL | Status: DC
  Administered 2018-04-20 – 2018-04-26 (×7): 100 mg via ORAL
  Filled 2018-04-19 (×7): qty 1

## 2018-04-19 MED ORDER — INSULIN GLARGINE 100 UNIT/ML ~~LOC~~ SOLN
20.0000 [IU] | Freq: Every day | SUBCUTANEOUS | Status: DC
  Administered 2018-04-19 – 2018-04-20 (×2): 20 [IU] via SUBCUTANEOUS
  Filled 2018-04-19 (×5): qty 0.2

## 2018-04-19 MED ORDER — PANTOPRAZOLE SODIUM 40 MG PO TBEC
40.0000 mg | DELAYED_RELEASE_TABLET | Freq: Every day | ORAL | Status: DC
  Administered 2018-04-20 – 2018-04-22 (×3): 40 mg via ORAL
  Filled 2018-04-19 (×3): qty 1

## 2018-04-19 MED ORDER — ORAL CARE MOUTH RINSE
15.0000 mL | Freq: Two times a day (BID) | OROMUCOSAL | Status: DC
  Administered 2018-04-21 – 2018-04-25 (×6): 15 mL via OROMUCOSAL

## 2018-04-19 MED ORDER — IOPAMIDOL (ISOVUE-370) INJECTION 76%
60.0000 mL | Freq: Once | INTRAVENOUS | Status: AC | PRN
  Administered 2018-04-19: 60 mL via INTRAVENOUS

## 2018-04-19 MED ORDER — ALBUTEROL SULFATE (2.5 MG/3ML) 0.083% IN NEBU
5.0000 mg | INHALATION_SOLUTION | Freq: Once | RESPIRATORY_TRACT | Status: AC
  Administered 2018-04-19: 5 mg via RESPIRATORY_TRACT
  Filled 2018-04-19: qty 6

## 2018-04-19 MED ORDER — FUROSEMIDE 10 MG/ML IJ SOLN
60.0000 mg | Freq: Once | INTRAMUSCULAR | Status: AC
  Administered 2018-04-19: 60 mg via INTRAVENOUS
  Filled 2018-04-19: qty 8

## 2018-04-19 NOTE — H&P (Addendum)
[facility] NAME: Michael Wright    MR#:  [id number]  DATE OF BIRTH:  [birth year]  DATE OF ADMISSION:  04/19/2018  PRIMARY CARE PHYSICIAN: Maryland Pink, MD   REQUESTING/REFERRING PHYSICIAN: Dr Milus Banister  CHIEF COMPLAINT:   Chief Complaint  Patient presents with  . Shortness of Breath    HISTORY OF PRESENT ILLNESS:  Michael Wright  is a 77 y.o. male with a known history of diabetes.  He states he has been having trouble breathing worsening over the last 4 weeks.  Some slight cough.  States he did have a little fever last night.  Having some weight loss.  No complaints of chest pain.  He states that he does have some diarrhea occasionally.  In the ER, he was found to be hypoxic and placed on BiPAP in order to oxygenate.  Hospitalist services were contacted for further evaluation.  PAST MEDICAL HISTORY:   Past Medical History:  Diagnosis Date  . Anemia   . BPH (benign prostatic hyperplasia)   . Cough   . Dementia   . Diabetes mellitus, type 2 ([place])   . Gout   . Hyperlipidemia   . Hypertension   . Kidney stones     PAST SURGICAL HISTORY:   Past Surgical History:  Procedure Laterality Date  . ADENOIDECTOMY  at age 70  . SHOULDER SURGERY    . TONSILLECTOMY    . VESICOSTOMY      SOCIAL HISTORY:   Social History   Tobacco Use  . Smoking status: Never Smoker  . Smokeless tobacco: Former Systems developer    Types: Chew  Substance Use Topics  . Alcohol use: No    FAMILY HISTORY:   Family History  Problem Relation Age of Onset  . Allergies Daughter   . Diabetes Father     DRUG ALLERGIES:  No Known Allergies  REVIEW OF SYSTEMS:  CONSTITUTIONAL: Positive for fever, no chills or sweats.  Positive for fatigue.  Positive for weight loss. EYES: Wears glasses and had worsening vision so he saw the eye doctor to get new glasses. EARS, NOSE, AND THROAT: No tinnitus or ear pain. No sore  throat RESPIRATORY: Positive for cough, and shortness of breath.  No wheezing or hemoptysis.  CARDIOVASCULAR: No chest pain, orthopnea, edema.  GASTROINTESTINAL: No nausea, vomiting, or abdominal pain. No blood in bowel movements.  Positive diarrhea 3-4 times a week at night. GENITOURINARY: No dysuria, hematuria.  ENDOCRINE: No polyuria, nocturia,  HEMATOLOGY: No anemia, easy bruising or bleeding SKIN: No rash or lesion. MUSCULOSKELETAL: No joint pain or arthritis.   NEUROLOGIC: No tingling, numbness, weakness.  PSYCHIATRY: No anxiety or depression.   MEDICATIONS AT HOME:   Prior to Admission medications   Medication Sig Start Date End Date Taking? Authorizing Provider  allopurinol (ZYLOPRIM) 100 MG tablet Take 100 mg by mouth daily.      [provider]  aspirin EC 325 MG EC tablet Take 1 tablet (325 mg total) by mouth daily. 05/16/15   Dustin Flock, MD  colchicine 0.6 MG tablet Take 0.6 mg by mouth 2 (two) times daily as needed (for gout flares).    [provider]  donepezil (ARICEPT) 10 MG tablet Take 10 mg by mouth at bedtime. 09/13/15 10/13/15  [provider]  finasteride (PROSCAR) 5 MG tablet Take 5 mg by mouth daily.    [provider]  gemfibrozil (LOPID) 600 MG tablet Take 1 tablet (600 mg total)  by mouth 2 (two) times daily before a meal. 05/16/15   Dustin Flock, MD  glimepiride (AMARYL) 2 MG tablet Take 2 mg by mouth 2 (two) times daily.    [provider]  losartan-hydrochlorothiazide (HYZAAR) 100-25 MG per tablet Take 1 tablet by mouth daily.    [provider]  metFORMIN (GLUCOPHAGE) 500 MG tablet Take 500 mg by mouth 2 (two) times daily with a meal.     [provider]  metFORMIN (GLUCOPHAGE) 850 MG tablet Take 850 mg by mouth daily with breakfast.    [provider]  pantoprazole (PROTONIX) 40 MG tablet Take 1 tablet (40 mg total) by mouth daily. 05/16/15   Dustin Flock, MD  pioglitazone (ACTOS)  15 MG tablet Take 15 mg by mouth daily.    [provider]  pravastatin (PRAVACHOL) 40 MG tablet Take 40 mg by mouth at bedtime.     [provider]      VITAL SIGNS:  Blood pressure 116/61, pulse 98, temperature 98.7 F (37.1 C), temperature source Oral, resp. rate (!) 21, height 5\' 8"  (1.727 m), weight 101.6 kg, SpO2 94 %.  PHYSICAL EXAMINATION:  GENERAL:  77 y.o.-year-old patient lying in the bed with no acute distress.  EYES: Pupils equal, round, reactive to light and accommodation. No scleral icterus. Extraocular muscles intact.  HEENT: Head atraumatic, normocephalic. Oropharynx and nasopharynx clear.  NECK:  Supple, no jugular venous distention. No thyroid enlargement, no tenderness.  LUNGS: Decreased breath sounds bilateral, poor air entry bilaterally.  Rales at the bases.  Positive use of accessory muscles of respiration.  CARDIOVASCULAR: S1, S2 normal. No murmurs, rubs, or gallops.  ABDOMEN: Soft, nontender, nondistended. Bowel sounds present. No organomegaly or mass.  EXTREMITIES: Trace pedal edema, no cyanosis, or clubbing.  NEUROLOGIC: Cranial nerves II through XII are intact. Muscle strength 5/5 in all extremities. Sensation intact. Gait not checked.  PSYCHIATRIC: The patient is alert and oriented x 3.  SKIN: No rash, lesion, or ulcer.   LABORATORY PANEL:   CBC Recent Labs  Lab 04/19/18 1456  WBC 13.9*  HGB 15.0  HCT 46.3  PLT 236   ------------------------------------------------------------------------------------------------------------------  Chemistries  Recent Labs  Lab 04/19/18 1456  NA 137  K 3.8  CL 97*  CO2 29  GLUCOSE 453*  BUN 22  CREATININE 1.46*  CALCIUM 8.7*  AST 25  ALT 22  ALKPHOS 56  BILITOT 1.4*   ------------------------------------------------------------------------------------------------------------------  Cardiac Enzymes Recent Labs  Lab 04/19/18 1456  TROPONINI 0.08*    ------------------------------------------------------------------------------------------------------------------  RADIOLOGY:  Dg Chest 2 View  Result Date: 04/19/2018 CLINICAL DATA:  Worsening shortness of breath altered mental status EXAM: CHEST - 2 VIEW COMPARISON:  05/15/2015 FINDINGS: There is bilateral mild interstitial thickening. There is a calcified left lower lobe pulmonary nodule likely reflecting sequela prior granulomatous disease. There is a trace left pleural effusion. There is no pneumothorax. There is stable cardiomegaly. There is no acute osseous abnormality. IMPRESSION: Cardiomegaly with mild pulmonary vascular congestion. Electronically Signed   By: Kathreen Devoid   On: 04/19/2018 15:25   Ct Head Wo Contrast  Result Date: 04/19/2018 CLINICAL DATA:  Altered mental status, hallucinations for several days. EXAM: CT HEAD WITHOUT CONTRAST TECHNIQUE: Contiguous axial images were obtained from the base of the skull through the vertex without intravenous contrast. COMPARISON:  Head CT scan 10/13/2015. FINDINGS: Brain: No evidence of acute infarction, hemorrhage, hydrocephalus, extra-axial collection or mass lesion/mass effect. Mild atrophy and chronic microvascular ischemic change are seen. Cavum  septum pellucidum et vergae noted. Vascular: No hyperdense vessel or unexpected calcification. Skull: Intact.  No focal lesion. Sinuses/Orbits: Negative. Other: None. IMPRESSION: No acute abnormality. Mild atrophy and chronic microvascular ischemic change. Electronically Signed   By: Inge Rise M.D.   On: 04/19/2018 18:07   Ct Angio Chest Pe W And/or Wo Contrast  Result Date: 04/19/2018 CLINICAL DATA:  Worsening shortness of breath and altered mental status for 1-2 weeks, hallucinations, complex chest pain, suspected pulmonary embolism with high pretest probability, history hypertension, type II diabetes mellitus, cardiomegaly pulmonary vascular congestion, stroke EXAM: CT ANGIOGRAPHY CHEST  WITH CONTRAST TECHNIQUE: Multidetector CT imaging of the chest was performed using the standard protocol during bolus administration of intravenous contrast. Multiplanar CT image reconstructions and MIPs were obtained to evaluate the vascular anatomy. CONTRAST:  62mL ISOVUE-370 IOPAMIDOL (ISOVUE-370) INJECTION 76% IV COMPARISON:  CT chest without contrast 04/29/2011 . FINDINGS: Cardiovascular: Minimal atherosclerotic calcification aorta. Aorta normal caliber. Enlargement of cardiac chambers. No pericardial effusion. Well opacified pulmonary arteries. Pulmonary arteries appear patent without evidence of pulmonary embolism. Enlargement of central pulmonary arteries question pulmonary arterial hypertension. Mediastinum/Nodes: Base of cervical region normal appearance. No thoracic adenopathy. Esophagus unremarkable. Lungs/Pleura: Calcified granuloma LEFT lower lobe. Minimal pleural effusions bilaterally slightly greater on RIGHT. Scattered interstitial thickening which could represent minimal pulmonary edema. Dependent atelectasis in the posterior lower lobes. No segmental consolidation or pneumothorax. Upper Abdomen: Visualized upper abdomen unremarkable Musculoskeletal: No acute osseous findings. Review of the MIP images confirms the above findings. IMPRESSION: No evidence of pulmonary embolism. Enlarged central pulmonary arteries question pulmonary arterial hypertension Minimal atherosclerotic calcification aorta. Enlargement of cardiac chambers and minimal scattered interstitial thickening/infiltrate question mild pulmonary edema with tiny pleural effusions. Aortic Atherosclerosis (ICD10-I70.0). Electronically Signed   By: Lavonia Dana M.D.   On: 04/19/2018 20:04    EKG:   Sinus rhythm 96 bpm, right bundle branch block and left anterior physical block  IMPRESSION AND PLAN:   1.  Acute hypoxic and hypercarbic respiratory failure.  Patient placed on BiPAP in the ER.  Will be admitted to the CCU stepdown unit.   Case discussed with critical care specialist in [place] Dr. Mortimer Fries and he thinks this is more likely CHF.  Will give nebulizer treatments DuoNeb and budesonide.  Hold off on further steroids. 2.  Acute CHF.  Lasix 60 mg IV once.  Lasix 40 mg IV twice daily.  Check an echocardiogram.  Try low-dose Coreg.  Patient already on losartan. 3.  Type 2 diabetes mellitus.  Hold oral medications.  Start low-dose Lantus at night.  Sugars will be elevated secondary to steroids given in the ED.  Sliding scale insulin ordered. 4.  Chronic kidney disease stage III.  Watch with diuresis. 5.  BPH continue medications 6.  History of gout on allopurinol 7.  Elevated troponin hopefully secondary to acute hypoxic respiratory failure and acute CHF and demand ischemia.  Serial troponins. 8.  Lactic acidosis could be secondary to the patient being on metformin  All the records are reviewed and case discussed with ED provider. Management plans discussed with the patient, family and they are in agreement.  CODE STATUS: Full code  TOTAL TIME TAKING CARE OF THIS PATIENT: 50 minutes, including ACP time.   Loletha Grayer M.D on 04/19/2018 at 8:25 PM  Between 7am to 6pm - Pager - [phone]  After 6pm call admission pager [phone]  [facility]  [phone]  CC: Primary care physician; Maryland Pink, MD

## 2018-04-19 NOTE — Progress Notes (Signed)
Transported pt to CT on Bipap and returned to ED without incident. Pt tol Bipap well at this time.

## 2018-04-19 NOTE — ED Provider Notes (Signed)
[facility] Emergency Department Provider Note    First MD Initiated Contact with Patient 04/19/18 1657     (approximate)  I have reviewed the triage vital signs and the nursing notes.   HISTORY  Chief Complaint Shortness of Breath  Level V Caveat: dementia  HPI Michael Wright is a 77 y.o. male has a past medical history as listed below presents to the ER for evaluation of confusion and shortness of breath.  According to family at bedside with became confused and altered on Saturday.  Last time he was more lucid and coherent was on Friday.  Since then he is been reportedly hallucinating and not making any sense.  States he is also been more short of breath.  Does have a history of chronic dyspnea for which he wears oxygen at night but they state that over the past few days during the day his oxygen level is been getting low and he has been looking more short of breath.  No fevers.  No nausea or vomiting.  Patient unable to provide any additional history.    Past Medical History:  Diagnosis Date  . Anemia   . BPH (benign prostatic hyperplasia)   . Cough   . Dementia   . Diabetes mellitus, type 2 ([place])   . Gout   . Hyperlipidemia   . Hypertension   . Kidney stones    Family History  Problem Relation Age of Onset  . Allergies Daughter   . Diabetes Father    Past Surgical History:  Procedure Laterality Date  . ADENOIDECTOMY  at age 83  . SHOULDER SURGERY    . TONSILLECTOMY    . VESICOSTOMY     Patient Active Problem List   Diagnosis Date Noted  . Acute respiratory failure with hypoxia and hypercapnia ([place]) 04/19/2018  . AKI (acute kidney injury) ([place]) 05/15/2015  . Stroke ([place]) 05/15/2015  . Type 2 diabetes mellitus ([place]) 05/15/2015  . HTN (hypertension) 05/15/2015  . HLD (hyperlipidemia) 05/15/2015  . Abdominal pain, right upper quadrant 05/15/2015  . Hyperlipidemia   . Hypertension   . Anemia   . Diabetes mellitus, type 2 ([place])   .  Extrinsic asthma, unspecified       Prior to Admission medications   Medication Sig Start Date End Date Taking? Authorizing Provider  allopurinol (ZYLOPRIM) 100 MG tablet Take 100 mg by mouth daily.      [provider]  aspirin EC 325 MG EC tablet Take 1 tablet (325 mg total) by mouth daily. 05/16/15   Dustin Flock, MD  colchicine 0.6 MG tablet Take 0.6 mg by mouth 2 (two) times daily as needed (for gout flares).    [provider]  donepezil (ARICEPT) 10 MG tablet Take 10 mg by mouth at bedtime. 09/13/15 10/13/15  [provider]  finasteride (PROSCAR) 5 MG tablet Take 5 mg by mouth daily.    [provider]  gemfibrozil (LOPID) 600 MG tablet Take 1 tablet (600 mg total) by mouth 2 (two) times daily before a meal. 05/16/15   Dustin Flock, MD  glimepiride (AMARYL) 2 MG tablet Take 2 mg by mouth 2 (two) times daily.    [provider]  losartan-hydrochlorothiazide (HYZAAR) 100-25 MG per tablet Take 1 tablet by mouth daily.    [provider]  metFORMIN (GLUCOPHAGE) 500 MG tablet Take 500 mg by mouth 2 (two) times daily with a meal.     [provider]  metFORMIN (GLUCOPHAGE)  850 MG tablet Take 850 mg by mouth daily with breakfast.    [provider]  pantoprazole (PROTONIX) 40 MG tablet Take 1 tablet (40 mg total) by mouth daily. 05/16/15   Dustin Flock, MD  pioglitazone (ACTOS) 15 MG tablet Take 15 mg by mouth daily.    [provider]  pravastatin (PRAVACHOL) 40 MG tablet Take 40 mg by mouth at bedtime.     [provider]    Allergies Patient has no known allergies.    Social History Social History   Tobacco Use  . Smoking status: Never Smoker  . Smokeless tobacco: Former Systems developer    Types: Chew  Substance Use Topics  . Alcohol use: No  . Drug use: No    Review of Systems Patient denies headaches, rhinorrhea, blurry vision, numbness, shortness of breath, chest pain, edema, cough,  abdominal pain, nausea, vomiting, diarrhea, dysuria, fevers, rashes or hallucinations unless otherwise stated above in HPI. ____________________________________________   PHYSICAL EXAM:  VITAL SIGNS: Vitals:   04/19/18 1857 04/19/18 1900  BP:  116/61  Pulse:  98  Resp:    Temp:    SpO2: 98% 94%    Constitutional: Alert and disoriented.  Mild resp distress  Eyes: Conjunctivae are normal.  Head: Atraumatic. Nose: No congestion/rhinnorhea. Mouth/Throat: Mucous membranes are moist.   Neck: No stridor. Painless ROM.  Cardiovascular: Normal rate, regular rhythm. Grossly normal heart sounds.  Good peripheral circulation. Respiratory: Normal respiratory effort.  No retractions. Lungs CTAB. Gastrointestinal: Soft and nontender. No distention. No abdominal bruits. No CVA tenderness. Genitourinary:  Musculoskeletal: No lower extremity tenderness nor edema.  No joint effusions. Neurologic:  Normal speech and language. No gross focal neurologic deficits are appreciated. No facial droop  MAE spontaneously. Skin:  Skin is warm, dry and intact. No rash noted. Psychiatric: confused and disorganized,  loose associations,   ____________________________________________   LABS (all labs ordered are listed, but only abnormal results are displayed)  Results for orders placed or performed during the hospital encounter of 04/19/18 (from the past 24 hour(s))  CBC     Status: Abnormal   Collection Time: 04/19/18  2:56 PM  Result Value Ref Range   WBC 13.9 (H) 3.8 - 10.6 K/uL   RBC 4.93 4.40 - 5.90 MIL/uL   Hemoglobin 15.0 13.0 - 18.0 g/dL   HCT 46.3 40.0 - 52.0 %   MCV 94.0 80.0 - 100.0 fL   MCH 30.5 26.0 - 34.0 pg   MCHC 32.4 32.0 - 36.0 g/dL   RDW 13.9 11.5 - 14.5 %   Platelets 236 150 - 440 K/uL  Basic metabolic panel     Status: Abnormal   Collection Time: 04/19/18  2:56 PM  Result Value Ref Range   Sodium 137 135 - 145 mmol/L   Potassium 3.8 3.5 - 5.1 mmol/L   Chloride 97 (L) 98 - 111  mmol/L   CO2 29 22 - 32 mmol/L   Glucose, Bld 453 (H) 70 - 99 mg/dL   BUN 22 8 - 23 mg/dL   Creatinine, Ser 1.46 (H) 0.61 - 1.24 mg/dL   Calcium 8.7 (L) 8.9 - 10.3 mg/dL   GFR calc non Af Amer 45 (L) >60 mL/min   GFR calc Af Amer 52 (L) >60 mL/min   Anion gap 11 5 - 15  Hepatic function panel     Status: Abnormal   Collection Time: 04/19/18  2:56 PM  Result Value Ref Range   Total Protein 7.3 6.5 -  8.1 g/dL   Albumin 3.5 3.5 - 5.0 g/dL   AST 25 15 - 41 U/L   ALT 22 0 - 44 U/L   Alkaline Phosphatase 56 38 - 126 U/L   Total Bilirubin 1.4 (H) 0.3 - 1.2 mg/dL   Bilirubin, Direct 0.3 (H) 0.0 - 0.2 mg/dL   Indirect Bilirubin 1.1 (H) 0.3 - 0.9 mg/dL  Troponin I     Status: Abnormal   Collection Time: 04/19/18  2:56 PM  Result Value Ref Range   Troponin I 0.08 (HH) <0.03 ng/mL  Blood gas, venous     Status: Abnormal (Preliminary result)   Collection Time: 04/19/18  5:28 PM  Result Value Ref Range   pH, Ven 7.29 7.250 - 7.430   pCO2, Ven 79 (HH) 44.0 - 60.0 mmHg   pO2, Ven PENDING 32.0 - 45.0 mmHg   Bicarbonate 38.0 (H) 20.0 - 28.0 mmol/L   Acid-Base Excess 7.7 (H) 0.0 - 2.0 mmol/L   O2 Saturation 51.1 %   Patient temperature 37.0    Collection site VEIN    Sample type VENOUS   Brain natriuretic peptide     Status: Abnormal   Collection Time: 04/19/18  5:28 PM  Result Value Ref Range   B Natriuretic Peptide 115.0 (H) 0.0 - 100.0 pg/mL  Lactic acid, plasma     Status: Abnormal   Collection Time: 04/19/18  7:19 PM  Result Value Ref Range   Lactic Acid, Venous 2.3 (HH) 0.5 - 1.9 mmol/L   ____________________________________________  EKG My review and personal interpretation at Time: 17:08   Indication: sob  Rate: 95  Rhythm: sinus Axis: left Other: rbbb, normal intervals, ____________________________________________  RADIOLOGY  I personally reviewed all radiographic images ordered to evaluate for the above acute complaints and reviewed radiology reports and findings.  These  findings were personally discussed with the patient.  Please see medical record for radiology report.  ____________________________________________   PROCEDURES  Procedure(s) performed:  .Critical Care Performed by: Merlyn Lot, MD Authorized by: Merlyn Lot, MD   Critical care provider statement:    Critical care time (minutes):  40   Critical care time was exclusive of:  Separately billable procedures and treating other patients   Critical care was necessary to treat or prevent imminent or life-threatening deterioration of the following conditions:  Respiratory failure   Critical care was time spent personally by me on the following activities:  Development of treatment plan with patient or surrogate, discussions with consultants, evaluation of patient's response to treatment, examination of patient, obtaining history from patient or surrogate, ordering and performing treatments and interventions, ordering and review of laboratory studies, ordering and review of radiographic studies, pulse oximetry, re-evaluation of patient's condition and review of old charts      Critical Care performed: yes ____________________________________________   INITIAL IMPRESSION / [place] / ED COURSE  Pertinent labs & imaging results that were available during my care of the patient were reviewed by me and considered in my medical decision making (see chart for details).   DDX: Asthma, copd, CHF, pna, ptx, malignancy, Pe, anemia   Avier L Lizarraga is a 77 y.o. who presents to the ED with symptoms as described above.  Patient afebrile but she does have evidence confusion.  Possibly metabolic.  Will order ct head to evaluate.  Patient with moderate respiratory distress requiring supplemental o2.  Will check vbg.  Clinical Course as of Apr 19 2017  Tue Apr 19, 2018  1744  Patient with acute respiratory failure with hypoxia and hypercapnia.  Seems most clinically consistent  with congestive heart failure.  COPD also on the differential but no previous diagnosis of this and his x-ray shows more edema.  Will place on BiPAP as this may explain his encephalopathy.   [PR]  1958 CT angios does not show large PE.  We will continue treatment for COPD.  Sign possible congestive heart failure given evidence of elevated troponin BNP.  Lactate mildly elevated 2.3 likely secondary to respiratory effort.  Fever to suggest sepsis.   [PR]    Clinical Course User Index [PR] Merlyn Lot, MD     As part of my medical decision making, I reviewed the following data within the [place] notes reviewed and incorporated, Labs reviewed, notes from prior ED visits.  ____________________________________________   FINAL CLINICAL IMPRESSION(S) / ED DIAGNOSES  Final diagnoses:  Acute respiratory failure with hypoxia and hypercapnia (HCC)  Acute encephalopathy      NEW MEDICATIONS STARTED DURING THIS VISIT:  New Prescriptions   No medications on file     Note:  This document was prepared using Dragon voice recognition software and may include unintentional dictation errors.    Merlyn Lot, MD 04/19/18 2018

## 2018-04-19 NOTE — Progress Notes (Signed)
Transported pt to ICU 10 on Bipap without incident. Pt remains on Bipap and tolerating well at this time. Report given to ICU RT.

## 2018-04-19 NOTE — ED Notes (Signed)
Pt transported to and from CT on BIPAP with this nurse and respiratory.

## 2018-04-19 NOTE — Progress Notes (Signed)
Patient ID: Michael Wright, male   DOB: [birth year], 77 y.o.   MRN: [id number]  ACP note  Patient present  Diagnosis: Acute hypoxic and hypercarbic respiratory failure, acute CHF, type 2 diabetes mellitus, chronic kidney disease stage III, BPH, history of gout, elevated troponin, lactic acidosis  Full CODE STATUS  Plan.  Admit to the CCU stepdown unit.  Support respiratory care with oxygen and BiPAP for now.  Diuresis with IV Lasix.  Patient already on losartan.  Add low-dose Coreg.  Time spent on ACP discussion 17 minutes Dr. Loletha Grayer

## 2018-04-19 NOTE — ED Notes (Signed)
Date and time results received: 04/19/18 1930  Test: Troponin Critical Value: 0.08  Name of Provider Notified: Quentin Cornwall  Orders Received? Or Actions Taken?: no further orders given at this time will continue to monitor

## 2018-04-19 NOTE — ED Triage Notes (Addendum)
Pt presents today via pov with Family. Pt wife passed in May. Pt daughters state he is super SHOB. Pt is sleeping in the wheelchair and  States he has been this way for weeks since it got hot. Pt states he thinks he had a stroke on Saturday, pt states he woke up Saturday and [place]. Daughter states that he has been hallucinating at times. Pt states he could not remember things after Saturday, pt states memory is back now. Pt states his memory is the same as normal. Pt states his wife died from a CVA.

## 2018-04-20 ENCOUNTER — Inpatient Hospital Stay
Admit: 2018-04-20 | Discharge: 2018-04-20 | Disposition: A | Discharge status: Home / Self Care | Payer: Medicare HMO | Attending: Internal Medicine | Admitting: Internal Medicine

## 2018-04-20 DIAGNOSIS — J9601 Acute respiratory failure with hypoxia: Secondary | ICD-10-CM

## 2018-04-20 DIAGNOSIS — J9602 Acute respiratory failure with hypercapnia: Secondary | ICD-10-CM

## 2018-04-20 DIAGNOSIS — J810 Acute pulmonary edema: Secondary | ICD-10-CM

## 2018-04-20 LAB — TROPONIN I: TROPONIN I: 0.1 ng/mL — AB (ref <0.03)

## 2018-04-20 LAB — LIPID PANEL
CHOLESTEROL: 150 mg/dL (ref 0–200)
HDL: 32 mg/dL — ABNORMAL LOW (ref >40)
LDL Cholesterol: 96 mg/dL (ref 0–99)
Total CHOL/HDL Ratio: 4.7 RATIO
Triglycerides: 110 mg/dL (ref <150)
VLDL: 22 mg/dL (ref 0–40)

## 2018-04-20 LAB — MRSA PCR SCREENING: MRSA BY PCR: NEGATIVE

## 2018-04-20 LAB — GLUCOSE, CAPILLARY
GLUCOSE-CAPILLARY: 375 mg/dL — AB (ref 70–99)
Glucose-Capillary: 310 mg/dL — ABNORMAL HIGH (ref 70–99)
Glucose-Capillary: 71 mg/dL (ref 70–99)
Glucose-Capillary: 127 mg/dL — ABNORMAL HIGH (ref 70–99)

## 2018-04-20 LAB — PROCALCITONIN

## 2018-04-20 MED ORDER — INSULIN ASPART 100 UNIT/ML ~~LOC~~ SOLN
0.0000 [IU] | Freq: Three times a day (TID) | SUBCUTANEOUS | Status: DC
  Administered 2018-04-20: 11 [IU] via SUBCUTANEOUS
  Administered 2018-04-21: 3 [IU] via SUBCUTANEOUS
  Administered 2018-04-21: 5 [IU] via SUBCUTANEOUS
  Administered 2018-04-21: 3 [IU] via SUBCUTANEOUS
  Administered 2018-04-22: 18:00:00 8 [IU] via SUBCUTANEOUS
  Administered 2018-04-22: 5 [IU] via SUBCUTANEOUS
  Administered 2018-04-22: 3 [IU] via SUBCUTANEOUS
  Administered 2018-04-23 (×2): 5 [IU] via SUBCUTANEOUS
  Administered 2018-04-23: 17:00:00 3 [IU] via SUBCUTANEOUS
  Administered 2018-04-24 (×3): 5 [IU] via SUBCUTANEOUS
  Administered 2018-04-25: 09:00:00 3 [IU] via SUBCUTANEOUS
  Administered 2018-04-25: 18:00:00 5 [IU] via SUBCUTANEOUS
  Administered 2018-04-26: 13:00:00 8 [IU] via SUBCUTANEOUS
  Administered 2018-04-26: 08:00:00 3 [IU] via SUBCUTANEOUS
  Filled 2018-04-20 (×18): qty 1

## 2018-04-20 MED ORDER — ENOXAPARIN SODIUM 40 MG/0.4ML ~~LOC~~ SOLN
40.0000 mg | SUBCUTANEOUS | Status: DC
  Administered 2018-04-20: 23:00:00 40 mg via SUBCUTANEOUS
  Filled 2018-04-20: qty 0.4

## 2018-04-20 MED ORDER — INSULIN ASPART 100 UNIT/ML ~~LOC~~ SOLN
0.0000 [IU] | Freq: Every day | SUBCUTANEOUS | Status: DC
  Administered 2018-04-22: 3 [IU] via SUBCUTANEOUS
  Administered 2018-04-23: 22:00:00 2 [IU] via SUBCUTANEOUS
  Administered 2018-04-24: 21:00:00 3 [IU] via SUBCUTANEOUS
  Administered 2018-04-25: 22:00:00 2 [IU] via SUBCUTANEOUS
  Filled 2018-04-20 (×4): qty 1

## 2018-04-20 NOTE — Progress Notes (Signed)
Patient is admitted from ICU to room 117 with acute hypoxic and hypercarbic respiratory failure. Patient is alert oriented times 2, BP is in the low 80, MD notified recommended monitoring patient. Patient is sitting in the chair locked with family at side, telephone, bedside table and call light within  patient reach. Report received from [place], [place]. Will continue to monitor patient.

## 2018-04-20 NOTE — Progress Notes (Signed)
[facility] [place] NAME: Deionte Spivack    MR#:  [id number]  DATE OF BIRTH:  1941/01/27  SUBJECTIVE:  States he is feeling better this morning. Now off BiPAP and has been doing well on 5L O2. Says he feels hungry and would like to eat something.  REVIEW OF SYSTEMS:  Review of Systems  Constitutional: Negative for chills and fever.  HENT: Negative for congestion and sore throat.   Eyes: Negative for blurred vision and double vision.  Respiratory: Positive for shortness of breath. Negative for cough.   Cardiovascular: Negative for chest pain and palpitations.  Gastrointestinal: Negative for abdominal pain, nausea and vomiting.  Genitourinary: Negative for dysuria and frequency.  Musculoskeletal: Negative for back pain and neck pain.  Neurological: Negative for dizziness and headaches.  Psychiatric/Behavioral: Negative for depression. The patient is not nervous/anxious.     DRUG ALLERGIES:  No Known Allergies VITALS:  Blood pressure (!) 81/54, pulse 95, temperature 97.6 F (36.4 C), temperature source Oral, resp. rate (!) 22, height 5\' 6"  (1.676 m), weight 104 kg, SpO2 93 %. PHYSICAL EXAMINATION:  Physical Exam  GENERAL:  77 y.o.-year-old patient lying in the bed with no acute distress.  EYES: Pupils equal, round, reactive to light and accommodation. No scleral icterus. Extraocular muscles intact.  HEENT: Head atraumatic, normocephalic. Oropharynx and nasopharynx clear.  NECK:  Supple, no jugular venous distention. No thyroid enlargement, no tenderness.  LUNGS: Normal work of breathing, diminished breath sounds throughout all lung fields, +bibasilar crackles. No wheezing. CARDIOVASCULAR: RRR, S1, S2 normal. No murmurs, rubs, or gallops.  ABDOMEN: Soft, nontender, nondistended. Bowel sounds present. No organomegaly or mass.  EXTREMITIES: No LE edema, no cyanosis, or clubbing.  NEUROLOGIC: Cranial nerves II through XII are intact.  Muscle strength 5/5 in all extremities. Sensation intact. Gait not checked.  PSYCHIATRIC: The patient is alert and oriented x 3.  SKIN: No rash, lesion, or ulcer.  LABORATORY PANEL:  Male CBC Recent Labs  Lab 04/19/18 1456  WBC 13.9*  HGB 15.0  HCT 46.3  PLT 236   ------------------------------------------------------------------------------------------------------------------ Chemistries  Recent Labs  Lab 04/19/18 1456  NA 137  K 3.8  CL 97*  CO2 29  GLUCOSE 453*  BUN 22  CREATININE 1.46*  CALCIUM 8.7*  AST 25  ALT 22  ALKPHOS 56  BILITOT 1.4*   RADIOLOGY:  Ct Head Wo Contrast  Result Date: 04/19/2018 CLINICAL DATA:  Altered mental status, hallucinations for several days. EXAM: CT HEAD WITHOUT CONTRAST TECHNIQUE: Contiguous axial images were obtained from the base of the skull through the vertex without intravenous contrast. COMPARISON:  Head CT scan 10/13/2015. FINDINGS: Brain: No evidence of acute infarction, hemorrhage, hydrocephalus, extra-axial collection or mass lesion/mass effect. Mild atrophy and chronic microvascular ischemic change are seen. Cavum septum pellucidum et vergae noted. Vascular: No hyperdense vessel or unexpected calcification. Skull: Intact.  No focal lesion. Sinuses/Orbits: Negative. Other: None. IMPRESSION: No acute abnormality. Mild atrophy and chronic microvascular ischemic change. Electronically Signed   By: Inge Rise M.D.   On: 04/19/2018 18:07   Ct Angio Chest Pe W And/or Wo Contrast  Result Date: 04/19/2018 CLINICAL DATA:  Worsening shortness of breath and altered mental status for 1-2 weeks, hallucinations, complex chest pain, suspected pulmonary embolism with high pretest probability, history hypertension, type II diabetes mellitus, cardiomegaly pulmonary vascular congestion, stroke EXAM: CT ANGIOGRAPHY CHEST WITH CONTRAST TECHNIQUE: Multidetector CT imaging of the chest was performed using the standard protocol during  bolus  administration of intravenous contrast. Multiplanar CT image reconstructions and MIPs were obtained to evaluate the vascular anatomy. CONTRAST:  10mL ISOVUE-370 IOPAMIDOL (ISOVUE-370) INJECTION 76% IV COMPARISON:  CT chest without contrast 04/29/2011 . FINDINGS: Cardiovascular: Minimal atherosclerotic calcification aorta. Aorta normal caliber. Enlargement of cardiac chambers. No pericardial effusion. Well opacified pulmonary arteries. Pulmonary arteries appear patent without evidence of pulmonary embolism. Enlargement of central pulmonary arteries question pulmonary arterial hypertension. Mediastinum/Nodes: Base of cervical region normal appearance. No thoracic adenopathy. Esophagus unremarkable. Lungs/Pleura: Calcified granuloma LEFT lower lobe. Minimal pleural effusions bilaterally slightly greater on RIGHT. Scattered interstitial thickening which could represent minimal pulmonary edema. Dependent atelectasis in the posterior lower lobes. No segmental consolidation or pneumothorax. Upper Abdomen: Visualized upper abdomen unremarkable Musculoskeletal: No acute osseous findings. Review of the MIP images confirms the above findings. IMPRESSION: No evidence of pulmonary embolism. Enlarged central pulmonary arteries question pulmonary arterial hypertension Minimal atherosclerotic calcification aorta. Enlargement of cardiac chambers and minimal scattered interstitial thickening/infiltrate question mild pulmonary edema with tiny pleural effusions. Aortic Atherosclerosis (ICD10-I70.0). Electronically Signed   By: Lavonia Dana M.D.   On: 04/19/2018 20:04   ASSESSMENT AND PLAN:   1.  Acute hypoxic and hypercarbic respiratory failure- likely due to CHF exacerbation. Initially on BiPAP, but has been transitioned to [place] today. - continue Lasix 40mg  IV bid. - wean O2 as able - continue pulmicort bid and duonebs prn   2.  Acute CHF- improved - continue lasix 40mg  IV bid - daily weights, strict I/O - cardiology  consulted - ECHO pending - coreg started this admission - holding losartan for hypotension  3. Hypotension- normotensive on admission but BPs in the 80s/50s this afternoon. - hold losartan - check BP before evening dose of lasix and coreg. May need to hold these meds.  4.  Type 2 diabetes mellitus- uncontrolled. Blood sugars have been in the 200s-300s, likely related to steroids given in the ED. - increased to moderate SSI today - continue lantus 20 units qhs - will need to discontinue pioglitazone on discharge to do CHF  5.  Chronic kidney disease stage III- stable - monitor - avoid nephrotoxic meds  6.  BPH- stable - continue medications  7.  History of gout- no signs of acute flare - continue allopurinol  8.  Elevated troponin- likely due to demand ischemia. No active chest pain. Troponins elevated but flat. - management per cards  9.  Lactic acidosis- could be secondary to the patient being on metformin - repeat lactic acid  All the records are reviewed and case discussed with Care Management/Social Worker. Management plans discussed with the patient, family and they are in agreement.  CODE STATUS: Prior  TOTAL TIME TAKING CARE OF THIS PATIENT: 35 minutes.   More than 50% of the time was spent in counseling/coordination of care: YES  POSSIBLE D/C IN 2-3 DAYS, DEPENDING ON CLINICAL CONDITION.   Berna Spare Mayo M.D on 04/20/2018 at 3:36 PM  Between 7am to 6pm - Pager - [phone]  After 6pm go to [email or web address] - Proofreader  [facility]  Office  [phone]  CC: Primary care physician; [place] Pink, MD  Note: This dictation was prepared with Dragon dictation along with smaller phrase technology. Any transcriptional errors that result from this process are unintentional.

## 2018-04-20 NOTE — Progress Notes (Signed)
Pt being transferred to room 117. Report called to Reatha Armour, Therapist, sports. Pt and belongings transferred to room 117 without incident.

## 2018-04-20 NOTE — Consult Note (Addendum)
PULMONARY / CRITICAL CARE MEDICINE   Name: Michael Wright MRN: [id number] DOB: Apr 21, 1941    ADMISSION DATE:  04/19/2018   CONSULTATION DATE:  04/19/18  REFERRING MD:  Dr  Leslye Peer  REASON: Acute hypoxic respiratory failure  HISTORY OF PRESENT ILLNESS:   This is a 77 year old male with a history of type 2 diabetes, dementia, and hypertension who presented to the ED with worsening shortness of breath.  He has no prior history of COPD.  ED work-up showed pulmonary edema on chest x-ray, hyperglycemia with a blood glucose level of 453, acute on chronic renal failure with a creatinine of 1.46, lactic acid of 2.3 and a mildly elevated troponin at 0.08.  He is being admitted to the ICU on BiPAP. PAST MEDICAL HISTORY :  He  has a past medical history of Anemia, BPH (benign prostatic hyperplasia), Cough, Dementia, Diabetes mellitus, type 2 ([place]), Gout, Hyperlipidemia, Hypertension, and Kidney stones.  PAST SURGICAL HISTORY: He  has a past surgical history that includes Tonsillectomy; Adenoidectomy (at age 66); Vesicostomy; and Shoulder surgery.  No Known Allergies  No current facility-administered medications on file prior to encounter.    Current Outpatient Medications on File Prior to Encounter  Medication Sig  . allopurinol (ZYLOPRIM) 100 MG tablet Take 100 mg by mouth daily.    Marland Kitchen aspirin EC 325 MG EC tablet Take 1 tablet (325 mg total) by mouth daily.  . colchicine 0.6 MG tablet Take 0.6 mg by mouth 2 (two) times daily as needed (for gout flares).  . donepezil (ARICEPT) 10 MG tablet Take 10 mg by mouth at bedtime.  . finasteride (PROSCAR) 5 MG tablet Take 5 mg by mouth daily.  Marland Kitchen gemfibrozil (LOPID) 600 MG tablet Take 1 tablet (600 mg total) by mouth 2 (two) times daily before a meal.  . glimepiride (AMARYL) 2 MG tablet Take 2 mg by mouth 2 (two) times daily.  Marland Kitchen losartan-hydrochlorothiazide (HYZAAR) 100-25 MG per tablet Take 1 tablet by mouth daily.  . metFORMIN (GLUCOPHAGE) 500 MG  tablet Take 500 mg by mouth 2 (two) times daily with a meal.   . metFORMIN (GLUCOPHAGE) 850 MG tablet Take 850 mg by mouth daily with breakfast.  . pantoprazole (PROTONIX) 40 MG tablet Take 1 tablet (40 mg total) by mouth daily.  . pioglitazone (ACTOS) 15 MG tablet Take 15 mg by mouth daily.  . pravastatin (PRAVACHOL) 40 MG tablet Take 40 mg by mouth at bedtime.     FAMILY HISTORY:  His family history includes Allergies in his daughter; Diabetes in his father.  SOCIAL HISTORY: He  reports that he has never smoked. He quit smokeless tobacco use about 39 years ago.  His smokeless tobacco use included chew. He reports that he does not drink alcohol or use drugs.  REVIEW OF SYSTEMS:   Unable to obtain as patient is on continuous BiPAP  SUBJECTIVE:    VITAL SIGNS: BP 118/72   Pulse 94   Temp 97.9 F (36.6 C) (Axillary)   Resp 18   Ht 5\' 6"  (1.676 m)   Wt 104 kg   SpO2 97%   BMI 37.01 kg/m   HEMODYNAMICS:    VENTILATOR SETTINGS: FiO2 (%):  [35 %-45 %] 40 %  INTAKE / OUTPUT: No intake/output data recorded.  PHYSICAL EXAMINATION: General: No acute distress Neuro: Alert and oriented x2, follows basic commands HEENT: PERRLA, mild JVD, trachea midline Cardiovascular: Apical pulse regular, S1-S2, no murmur regurg or gallop, +2 pulses bilaterally, trace edema bilaterally Lungs:  Lateral breath sounds, diminished in the bases Abdomen: Obese, normal bowel was Musculoskeletal: Positive range of motion, no JVD Skin: Warm and dry  LABS:  BMET Recent Labs  Lab 04/19/18 1456  NA 137  K 3.8  CL 97*  CO2 29  BUN 22  CREATININE 1.46*  GLUCOSE 453*    Electrolytes Recent Labs  Lab 04/19/18 1456  CALCIUM 8.7*    CBC Recent Labs  Lab 04/19/18 1456  WBC 13.9*  HGB 15.0  HCT 46.3  PLT 236    Coag's No results for input(s): APTT, INR in the last 168 hours.  Sepsis Markers Recent Labs  Lab 04/19/18 1919 04/20/18 0239  LATICACIDVEN 2.3*  --   PROCALCITON  --   <0.10    ABG No results for input(s): PHART, PCO2ART, PO2ART in the last 168 hours.  Liver Enzymes Recent Labs  Lab 04/19/18 1456  AST 25  ALT 22  ALKPHOS 56  BILITOT 1.4*  ALBUMIN 3.5    Cardiac Enzymes Recent Labs  Lab 04/19/18 1456 04/19/18 2224 04/20/18 0239  TROPONINI 0.08* 0.11* 0.10*    Glucose Recent Labs  Lab 04/19/18 2104  GLUCAP 239*    Imaging Dg Chest 2 View  Result Date: 04/19/2018 CLINICAL DATA:  Worsening shortness of breath altered mental status EXAM: CHEST - 2 VIEW COMPARISON:  05/15/2015 FINDINGS: There is bilateral mild interstitial thickening. There is a calcified left lower lobe pulmonary nodule likely reflecting sequela prior granulomatous disease. There is a trace left pleural effusion. There is no pneumothorax. There is stable cardiomegaly. There is no acute osseous abnormality. IMPRESSION: Cardiomegaly with mild pulmonary vascular congestion. Electronically Signed   By: Kathreen Devoid   On: 04/19/2018 15:25   Ct Head Wo Contrast  Result Date: 04/19/2018 CLINICAL DATA:  Altered mental status, hallucinations for several days. EXAM: CT HEAD WITHOUT CONTRAST TECHNIQUE: Contiguous axial images were obtained from the base of the skull through the vertex without intravenous contrast. COMPARISON:  Head CT scan 10/13/2015. FINDINGS: Brain: No evidence of acute infarction, hemorrhage, hydrocephalus, extra-axial collection or mass lesion/mass effect. Mild atrophy and chronic microvascular ischemic change are seen. Cavum septum pellucidum et vergae noted. Vascular: No hyperdense vessel or unexpected calcification. Skull: Intact.  No focal lesion. Sinuses/Orbits: Negative. Other: None. IMPRESSION: No acute abnormality. Mild atrophy and chronic microvascular ischemic change. Electronically Signed   By: Inge Rise M.D.   On: 04/19/2018 18:07   Ct Angio Chest Pe W And/or Wo Contrast  Result Date: 04/19/2018 CLINICAL DATA:  Worsening shortness of breath and  altered mental status for 1-2 weeks, hallucinations, complex chest pain, suspected pulmonary embolism with high pretest probability, history hypertension, type II diabetes mellitus, cardiomegaly pulmonary vascular congestion, stroke EXAM: CT ANGIOGRAPHY CHEST WITH CONTRAST TECHNIQUE: Multidetector CT imaging of the chest was performed using the standard protocol during bolus administration of intravenous contrast. Multiplanar CT image reconstructions and MIPs were obtained to evaluate the vascular anatomy. CONTRAST:  48mL ISOVUE-370 IOPAMIDOL (ISOVUE-370) INJECTION 76% IV COMPARISON:  CT chest without contrast 04/29/2011 . FINDINGS: Cardiovascular: Minimal atherosclerotic calcification aorta. Aorta normal caliber. Enlargement of cardiac chambers. No pericardial effusion. Well opacified pulmonary arteries. Pulmonary arteries appear patent without evidence of pulmonary embolism. Enlargement of central pulmonary arteries question pulmonary arterial hypertension. Mediastinum/Nodes: Base of cervical region normal appearance. No thoracic adenopathy. Esophagus unremarkable. Lungs/Pleura: Calcified granuloma LEFT lower lobe. Minimal pleural effusions bilaterally slightly greater on RIGHT. Scattered interstitial thickening which could represent minimal pulmonary edema. Dependent atelectasis in the posterior lower  lobes. No segmental consolidation or pneumothorax. Upper Abdomen: Visualized upper abdomen unremarkable Musculoskeletal: No acute osseous findings. Review of the MIP images confirms the above findings. IMPRESSION: No evidence of pulmonary embolism. Enlarged central pulmonary arteries question pulmonary arterial hypertension Minimal atherosclerotic calcification aorta. Enlargement of cardiac chambers and minimal scattered interstitial thickening/infiltrate question mild pulmonary edema with tiny pleural effusions. Aortic Atherosclerosis (ICD10-I70.0). Electronically Signed   By: Lavonia Dana M.D.   On: 04/19/2018  20:04    STUDIES:  2D echo pending  SIGNIFICANT EVENTS: 04/19/2018: Admitted  LINES/TUBES: Peripheral IVs  DISCUSSION: 77 year old male presenting with acute hypoxic/hypercarbic respiratory failure likely due to CHF exacerbation but a component of obesity hypoventilation syndrome cannot be eliminated  ASSESSMENT  Acute hypoxic/hypercarbic respiratory failure Acute acute pulmonary edema Type 2 diabetes with hyperglycemia  Plan Continues BiPAP as tolerated and titrate to nasal cannula Nebulized bronchodilators and steroids Trend procalcitonin ABG and chest x-ray as needed 2D echo to evaluate left ventricular ejection fraction Blood glucose monitoring with aggressive  sliding scale coverage GI and DVT prophylaxis FAMILY  - Updates: No family at bedside. Will update when available.   Magdalene S. [facility] ANP-BC Pulmonary and Critical Care Medicine [facility] Pager [phone] or [phone]  NB: This document was prepared using Dragon voice recognition software and may include unintentional dictation errors.   04/20/2018, 5:12 AM

## 2018-04-20 NOTE — Progress Notes (Signed)
*  PRELIMINARY RESULTS* Echocardiogram 2D Echocardiogram has been performed.  Michael Wright 04/20/2018, 10:55 AM

## 2018-04-20 NOTE — Progress Notes (Addendum)
Inpatient Diabetes Program Recommendations  AACE/ADA: New Consensus Statement on Inpatient Glycemic Control (2019)  Target Ranges:  Prepandial:   less than 140 mg/dL      Peak postprandial:   less than 180 mg/dL (1-2 hours)      Critically ill patients:  140 - 180 mg/dL  Results for Michael Wright, Michael Wright (MRN [id number]) as of 04/20/2018 09:29  Ref. Range 04/19/2018 21:04 04/20/2018 07:20  Glucose-Capillary Latest Ref Range: 70 - 99 mg/dL 239 (H) 375 (H)  Results for Michael Wright, Michael Wright (MRN [id number]) as of 04/20/2018 09:53  Ref. Range 04/19/2018 14:56 04/19/2018 22:24  Glucose Latest Ref Range: 70 - 99 mg/dL 453 (H)   Hemoglobin A1C Latest Ref Range: 4.8 - 5.6 %  10.7 (H)   Review of Glycemic Control  Diabetes history: DM2 Outpatient Diabetes medications: Amaryl 2 mg BID, Metformin 500 mg BID, Actos 15 mg daily Current orders for Inpatient glycemic control: Lantus 20 units QHS, Novolog 0-9 units TID with meals, Novolog 0-5 units QHS  Inpatient Diabetes Program Recommendations: Insulin-Correction: Please consider increasing Novolog correction to moderate scale (0-15 units) and if patient will remain NPO, change frequency to Q4H. Outpatient DM medications: Noted possible CHF and pulmonary edema. Due to side effect of fluid retention with Actos, MD may want to consider discontinuing Actos at time of discharge. HgbA1C: A1C 10.7% on 04/19/18 indicating an average glucose of 260 mg/dl. Per office visit note by Dr. Kary Kos on 12/06/17, prior A1C 7.8% on 08/03/2017. Patient needs to follow up with PCP regarding DM control as he may need additional DM medications to improve DM control.   NOTE: Noted in reviewing chart that patient received steroid injection in right knee on 12/10/17. Per ED triage note by C. Judeth Horn, RN patient's wife passed away in January 31, 2023 and patient has been noted by family to be more confused and hallucinating over the past few days. Noted patient has a history of dementia and patient's  daughter manages patient's medications. Initial glucose in ER was 453 mg/dl and patient received Solumedrol 125 mg at 20:48 on 04/19/18. No other steroids are ordered.    Thanks, Barnie Alderman, RN, MSN, CDE Diabetes Coordinator Inpatient Diabetes Program [phone] (Team Pager from 8am to 5pm)

## 2018-04-21 LAB — BASIC METABOLIC PANEL
Anion gap: 11 (ref 5–15)
BUN: 52 mg/dL — AB (ref 8–23)
CHLORIDE: 98 mmol/L (ref 98–111)
CO2: 34 mmol/L — ABNORMAL HIGH (ref 22–32)
CREATININE: 2.6 mg/dL — AB (ref 0.61–1.24)
Calcium: 8.4 mg/dL — ABNORMAL LOW (ref 8.9–10.3)
GFR, EST AFRICAN AMERICAN: 26 mL/min — AB (ref >60)
GFR, EST NON AFRICAN AMERICAN: 22 mL/min — AB (ref >60)
Glucose, Bld: 142 mg/dL — ABNORMAL HIGH (ref 70–99)
Potassium: 3.6 mmol/L (ref 3.5–5.1)
SODIUM: 143 mmol/L (ref 135–145)

## 2018-04-21 LAB — CBC
HCT: 43.8 % (ref 40.0–52.0)
Hemoglobin: 14.3 g/dL (ref 13.0–18.0)
MCH: 30.5 pg (ref 26.0–34.0)
MCHC: 32.5 g/dL (ref 32.0–36.0)
MCV: 93.8 fL (ref 80.0–100.0)
PLATELETS: 240 10*3/uL (ref 150–440)
RBC: 4.67 MIL/uL (ref 4.40–5.90)
RDW: 13.8 % (ref 11.5–14.5)
WBC: 17.5 10*3/uL — ABNORMAL HIGH (ref 3.8–10.6)

## 2018-04-21 LAB — LACTIC ACID, PLASMA: LACTIC ACID, VENOUS: 2.1 mmol/L — AB (ref 0.5–1.9)

## 2018-04-21 LAB — ECHOCARDIOGRAM COMPLETE
Height: 66 in
Weight: 3668.45 oz

## 2018-04-21 LAB — GLUCOSE, CAPILLARY
GLUCOSE-CAPILLARY: 158 mg/dL — AB (ref 70–99)
GLUCOSE-CAPILLARY: 160 mg/dL — AB (ref 70–99)
Glucose-Capillary: 229 mg/dL — ABNORMAL HIGH (ref 70–99)
Glucose-Capillary: 189 mg/dL — ABNORMAL HIGH (ref 70–99)

## 2018-04-21 LAB — PROCALCITONIN: PROCALCITONIN: 0.12 ng/mL

## 2018-04-21 MED ORDER — TRAZODONE HCL 50 MG PO TABS
50.0000 mg | ORAL_TABLET | Freq: Every day | ORAL | Status: DC
  Administered 2018-04-21 – 2018-04-25 (×5): 50 mg via ORAL
  Filled 2018-04-21 (×5): qty 1

## 2018-04-21 MED ORDER — ENOXAPARIN SODIUM 30 MG/0.3ML ~~LOC~~ SOLN
30.0000 mg | SUBCUTANEOUS | Status: DC
  Administered 2018-04-21: 30 mg via SUBCUTANEOUS
  Filled 2018-04-21: qty 0.3

## 2018-04-21 MED ORDER — LORATADINE 10 MG PO TABS
10.0000 mg | ORAL_TABLET | Freq: Every day | ORAL | Status: DC
  Administered 2018-04-21 – 2018-04-26 (×6): 10 mg via ORAL
  Filled 2018-04-21 (×6): qty 1

## 2018-04-21 MED ORDER — SODIUM CHLORIDE 0.9 % IV SOLN
INTRAVENOUS | Status: DC
  Administered 2018-04-21 – 2018-04-22 (×3): via INTRAVENOUS

## 2018-04-21 MED ORDER — AMOXICILLIN-POT CLAVULANATE 500-125 MG PO TABS
1.0000 | ORAL_TABLET | Freq: Two times a day (BID) | ORAL | Status: DC
  Administered 2018-04-21 – 2018-04-23 (×5): 500 mg via ORAL
  Filled 2018-04-21 (×6): qty 1

## 2018-04-21 MED ORDER — INSULIN GLARGINE 100 UNIT/ML ~~LOC~~ SOLN
15.0000 [IU] | Freq: Every day | SUBCUTANEOUS | Status: DC
  Administered 2018-04-21: 15 [IU] via SUBCUTANEOUS
  Filled 2018-04-21 (×2): qty 0.15

## 2018-04-21 NOTE — Progress Notes (Signed)
Patient refuses to wear cardia monitor. Will discontinue
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
## 2018-04-21 NOTE — NC FL2 (Signed)
[place] LEVEL OF CARE SCREENING TOOL     IDENTIFICATION  Patient Name: Michael Wright Birthdate: 1941/05/21 Sex: male Admission Date (Current Location): 04/19/2018  [place] and [place] Number:  Engineering geologist and Address:  [facility], [address] [place], [place] [address]      Provider Number: [id number]  Attending Physician Name and Address:  Loletha Grayer, MD  Relative Name and Phone Number:  Augustine Radar- daughter [phone]    Current Level of Care: Hospital Recommended Level of Care: [place] Prior Approval Number:    Date Approved/Denied:   PASRR Number: [id number]  Discharge Plan: SNF    Current Diagnoses: Patient Active Problem List   Diagnosis Date Noted  . Acute pulmonary edema (HCC)   . Acute respiratory failure with hypoxia and hypercapnia ([place]) 04/19/2018  . AKI (acute kidney injury) ([place]) 05/15/2015  . Stroke ([place]) 05/15/2015  . Type 2 diabetes mellitus ([place]) 05/15/2015  . HTN (hypertension) 05/15/2015  . HLD (hyperlipidemia) 05/15/2015  . Abdominal pain, right upper quadrant 05/15/2015  . Hyperlipidemia   . Hypertension   . Anemia   . Diabetes mellitus, type 2 ([place])   . Extrinsic asthma, unspecified     Orientation RESPIRATION BLADDER Height & Weight     Self, Time, Situation, Place  O2(2.5 liters ) Continent Weight: 229 lb 4.5 oz (104 kg) Height:  5\' 6"  (167.6 cm)  BEHAVIORAL SYMPTOMS/MOOD NEUROLOGICAL BOWEL NUTRITION STATUS  (none) (none) Continent Diet(Heart Healthy/Carb modified )  AMBULATORY STATUS COMMUNICATION OF NEEDS Skin   Extensive Assist Verbally Normal                       Personal Care Assistance Level of Assistance  Bathing, Feeding, Dressing Bathing Assistance: Limited assistance Feeding assistance: Independent Dressing Assistance: Limited assistance     Functional Limitations Info  Sight, Hearing, Speech Sight Info:  Adequate Hearing Info: Adequate Speech Info: Adequate    SPECIAL CARE FACTORS FREQUENCY  PT (By licensed PT), OT (By licensed OT)     PT Frequency: 5 OT Frequency: 5            Contractures Contractures Info: Not present    Additional Factors Info  Code Status, Allergies Code Status Info: Full Code  Allergies Info: NKA           Current Medications (04/21/2018):  This is the current hospital active medication list Current Facility-Administered Medications  Medication Dose Route Frequency Provider Last Rate Last Dose  . 0.9 %  sodium chloride infusion   Intravenous Continuous Loletha Grayer, MD 50 mL/hr at 04/21/18 [phone]    . allopurinol (ZYLOPRIM) tablet 100 mg  100 mg Oral Daily Loletha Grayer, MD   100 mg at 04/21/18 0924  . amoxicillin-clavulanate (AUGMENTIN) 500-125 MG per tablet 500 mg  1 tablet Oral BID Loletha Grayer, MD   500 mg at 04/21/18 0924  . aspirin EC tablet 325 mg  325 mg Oral Daily Loletha Grayer, MD   325 mg at 04/21/18 0924  . budesonide (PULMICORT) nebulizer solution 0.5 mg  0.5 mg Nebulization BID Loletha Grayer, MD   0.5 mg at 04/21/18 0724  . carvedilol (COREG) tablet 3.125 mg  3.125 mg Oral BID WC Loletha Grayer, MD   3.125 mg at 04/21/18 0924  . chlorhexidine (PERIDEX) 0.12 % solution 15 mL  15 mL Mouth Rinse BID Loletha Grayer, MD   15 mL at 04/21/18 0924  . colchicine tablet  0.6 mg  0.6 mg Oral BID PRN Loletha Grayer, MD      . enoxaparin (LOVENOX) injection 30 mg  30 mg Subcutaneous Q24H Mayo, Pete Pelt, MD      . finasteride (PROSCAR) tablet 5 mg  5 mg Oral Daily Loletha Grayer, MD   5 mg at 04/21/18 0924  . gemfibrozil (LOPID) tablet 600 mg  600 mg Oral BID AC Loletha Grayer, MD   600 mg at 04/21/18 0924  . insulin aspart (novoLOG) injection 0-15 Units  0-15 Units Subcutaneous TID WC Conforti, John, DO   3 Units at 04/21/18 [phone]  . insulin aspart (novoLOG) injection 0-5 Units  0-5 Units Subcutaneous QHS Conforti, John, DO       . insulin glargine (LANTUS) injection 15 Units  15 Units Subcutaneous QHS Wieting, Richard, MD      . ipratropium-albuterol (DUONEB) 0.5-2.5 (3) MG/3ML nebulizer solution 3 mL  3 mL Nebulization Q6H Loletha Grayer, MD   3 mL at 04/21/18 0723  . MEDLINE mouth rinse  15 mL Mouth Rinse q12n4p Wieting, Richard, MD      . pantoprazole (PROTONIX) EC tablet 40 mg  40 mg Oral Daily Loletha Grayer, MD   40 mg at 04/21/18 0924  . pravastatin (PRAVACHOL) tablet 40 mg  40 mg Oral QHS Loletha Grayer, MD   40 mg at 04/20/18 2241  . traZODone (DESYREL) tablet 50 mg  50 mg Oral QHS Loletha Grayer, MD         Discharge Medications: Please see discharge summary for a list of discharge medications.  Relevant Imaging Results:  Relevant Lab Results:   Additional Information SSN [id number]  [name], [place]

## 2018-04-21 NOTE — Progress Notes (Signed)
Patient ID: Michael Wright, male   DOB: 09/04/1940, 77 y.o.   MRN: [id number]  [facility] PROGRESS NOTE  Michael Wright KNL:[id number] DOB: 1941-05-29 DOA: 04/19/2018 PCP: [place] Pink, MD  HPI/Subjective: Patient feels his breathing is fine.  Offers no complaints.  He states his wife just died a few weeks back.  Now he lives alone.  Objective: Vitals:   04/21/18 1339 04/21/18 1345  BP: (!) 103/56   Pulse: 91   Resp: 20   Temp: 97.7 F (36.5 C)   SpO2: 94% 92%    Filed Weights   04/19/18 1453 04/19/18 2106  Weight: 101.6 kg 104 kg    ROS: Review of Systems  Constitutional: Negative for chills and fever.  Eyes: Negative for blurred vision.  Respiratory: Negative for cough and shortness of breath.   Cardiovascular: Negative for chest pain.  Gastrointestinal: Negative for abdominal pain, constipation, diarrhea, nausea and vomiting.  Genitourinary: Negative for dysuria.  Musculoskeletal: Negative for joint pain.  Neurological: Negative for dizziness and headaches.   Exam: Physical Exam  HENT:  Nose: No mucosal edema.  Mouth/Throat: No oropharyngeal exudate or posterior oropharyngeal edema.  Eyes: Pupils are equal, round, and reactive to light. Conjunctivae, EOM and lids are normal.  Neck: No JVD present. Carotid bruit is not present. No edema present. No thyroid mass and no thyromegaly present.  Cardiovascular: S1 normal and S2 normal. Exam reveals no gallop.  No murmur heard. Pulses:      Dorsalis pedis pulses are 2+ on the right side, and 2+ on the left side.  Respiratory: No respiratory distress. He has no wheezes. He has no rhonchi. He has no rales.  GI: Soft. Bowel sounds are normal. There is no tenderness.  Musculoskeletal:       Right ankle: He exhibits no swelling.       Left ankle: He exhibits no swelling.  Lymphadenopathy:    He has no cervical adenopathy.  Neurological: He is alert. No cranial nerve deficit.  Skin: Skin is warm. No rash  noted. Nails show no clubbing.  Psychiatric: He has a normal mood and affect.      Data Reviewed: Basic Metabolic Panel: Recent Labs  Lab 04/19/18 1456 04/21/18 0301  NA 137 143  K 3.8 3.6  CL 97* 98  CO2 29 34*  GLUCOSE 453* 142*  BUN 22 52*  CREATININE 1.46* 2.60*  CALCIUM 8.7* 8.4*   Liver Function Tests: Recent Labs  Lab 04/19/18 1456  AST 25  ALT 22  ALKPHOS 56  BILITOT 1.4*  PROT 7.3  ALBUMIN 3.5   CBC: Recent Labs  Lab 04/19/18 1456 04/21/18 0301  WBC 13.9* 17.5*  HGB 15.0 14.3  HCT 46.3 43.8  MCV 94.0 93.8  PLT 236 240   Cardiac Enzymes: Recent Labs  Lab 04/19/18 1456 04/19/18 2224 04/20/18 0239  TROPONINI 0.08* 0.11* 0.10*   BNP (last 3 results) Recent Labs    04/19/18 1728  BNP 115.0*     CBG: Recent Labs  Lab 04/20/18 1150 04/20/18 1644 04/20/18 2143 04/21/18 0737 04/21/18 1154  GLUCAP 310* 71 127* 158* 229*    Recent Results (from the past 240 hour(s))  MRSA PCR Screening     Status: None   Collection Time: 04/19/18  9:11 PM  Result Value Ref Range Status   MRSA by PCR NEGATIVE NEGATIVE Final    Comment:        The GeneXpert MRSA Assay (FDA approved for NASAL specimens only), is  one component of a comprehensive MRSA colonization surveillance program. It is not intended to diagnose MRSA infection nor to guide or monitor treatment for MRSA infections. Performed at [facility], [address] [place], [place] [address]      Studies: Dg Chest 2 View  Result Date: 04/19/2018 CLINICAL DATA:  Worsening shortness of breath altered mental status EXAM: CHEST - 2 VIEW COMPARISON:  05/15/2015 FINDINGS: There is bilateral mild interstitial thickening. There is a calcified left lower lobe pulmonary nodule likely reflecting sequela prior granulomatous disease. There is a trace left pleural effusion. There is no pneumothorax. There is stable cardiomegaly. There is no acute osseous abnormality. IMPRESSION: Cardiomegaly  with mild pulmonary vascular congestion. Electronically Signed   By: Kathreen Devoid   On: 04/19/2018 15:25   Ct Head Wo Contrast  Result Date: 04/19/2018 CLINICAL DATA:  Altered mental status, hallucinations for several days. EXAM: CT HEAD WITHOUT CONTRAST TECHNIQUE: Contiguous axial images were obtained from the base of the skull through the vertex without intravenous contrast. COMPARISON:  Head CT scan 10/13/2015. FINDINGS: Brain: No evidence of acute infarction, hemorrhage, hydrocephalus, extra-axial collection or mass lesion/mass effect. Mild atrophy and chronic microvascular ischemic change are seen. Cavum septum pellucidum et vergae noted. Vascular: No hyperdense vessel or unexpected calcification. Skull: Intact.  No focal lesion. Sinuses/Orbits: Negative. Other: None. IMPRESSION: No acute abnormality. Mild atrophy and chronic microvascular ischemic change. Electronically Signed   By: Inge Rise M.D.   On: 04/19/2018 18:07   Ct Angio Chest Pe W And/or Wo Contrast  Result Date: 04/19/2018 CLINICAL DATA:  Worsening shortness of breath and altered mental status for 1-2 weeks, hallucinations, complex chest pain, suspected pulmonary embolism with high pretest probability, history hypertension, type II diabetes mellitus, cardiomegaly pulmonary vascular congestion, stroke EXAM: CT ANGIOGRAPHY CHEST WITH CONTRAST TECHNIQUE: Multidetector CT imaging of the chest was performed using the standard protocol during bolus administration of intravenous contrast. Multiplanar CT image reconstructions and MIPs were obtained to evaluate the vascular anatomy. CONTRAST:  65mL ISOVUE-370 IOPAMIDOL (ISOVUE-370) INJECTION 76% IV COMPARISON:  CT chest without contrast 04/29/2011 . FINDINGS: Cardiovascular: Minimal atherosclerotic calcification aorta. Aorta normal caliber. Enlargement of cardiac chambers. No pericardial effusion. Well opacified pulmonary arteries. Pulmonary arteries appear patent without evidence of  pulmonary embolism. Enlargement of central pulmonary arteries question pulmonary arterial hypertension. Mediastinum/Nodes: Base of cervical region normal appearance. No thoracic adenopathy. Esophagus unremarkable. Lungs/Pleura: Calcified granuloma LEFT lower lobe. Minimal pleural effusions bilaterally slightly greater on RIGHT. Scattered interstitial thickening which could represent minimal pulmonary edema. Dependent atelectasis in the posterior lower lobes. No segmental consolidation or pneumothorax. Upper Abdomen: Visualized upper abdomen unremarkable Musculoskeletal: No acute osseous findings. Review of the MIP images confirms the above findings. IMPRESSION: No evidence of pulmonary embolism. Enlarged central pulmonary arteries question pulmonary arterial hypertension Minimal atherosclerotic calcification aorta. Enlargement of cardiac chambers and minimal scattered interstitial thickening/infiltrate question mild pulmonary edema with tiny pleural effusions. Aortic Atherosclerosis (ICD10-I70.0). Electronically Signed   By: Lavonia Dana M.D.   On: 04/19/2018 20:04    Scheduled Meds: . allopurinol  100 mg Oral Daily  . amoxicillin-clavulanate  1 tablet Oral BID  . aspirin  325 mg Oral Daily  . budesonide (PULMICORT) nebulizer solution  0.5 mg Nebulization BID  . carvedilol  3.125 mg Oral BID WC  . chlorhexidine  15 mL Mouth Rinse BID  . enoxaparin (LOVENOX) injection  30 mg Subcutaneous Q24H  . finasteride  5 mg Oral Daily  . gemfibrozil  600 mg Oral  BID AC  . insulin aspart  0-15 Units Subcutaneous TID WC  . insulin aspart  0-5 Units Subcutaneous QHS  . insulin glargine  15 Units Subcutaneous QHS  . ipratropium-albuterol  3 mL Nebulization Q6H  . loratadine  10 mg Oral Daily  . mouth rinse  15 mL Mouth Rinse q12n4p  . pantoprazole  40 mg Oral Daily  . pravastatin  40 mg Oral QHS  . traZODone  50 mg Oral QHS   Continuous Infusions: . sodium chloride 50 mL/hr at 04/21/18 1306     Assessment/Plan:  1. Acute hypoxic and hypercarbic respiratory failure.  Patient initially required BiPAP in the emergency room.  Patient now on nasal cannula 2.5 L.  Hopefully will be able to get off oxygen. 2. Acute diastolic congestive heart failure.  Holding Lasix secondary to overdiuresis and hypotension. 3. Acute kidney injury on chronic kidney disease stage III likely from overdiuresis and hypotension.  Check creatinine on a daily basis 4. Procalcitonin slightly elevated, lactic acid increased.  Empiric Augmentin started 5. BPH on medication 6. History of gout on allopurinol 7. Elevated troponin demand ischemia from acute hypoxic respiratory failure and CHF 8. Weakness.  Physical therapy recommended rehab  Code Status:  Code Status History    Date Active Date Inactive Code Status Order ID Comments User Context   05/15/2015 2350 05/16/2015 1949 Full Code [id number]  Lance Coon, MD ED     Family Communication: Spoke with 3 daughters and gave them update Disposition Plan: Creatinine must be better prior to disposition  Antibiotics:  Augmentin  Time spent: 35 minutes in coordination of care and speaking with family  Raena Pau [employer]

## 2018-04-21 NOTE — Progress Notes (Signed)
   04/21/18 1510  Clinical Encounter Type  Visited With Patient and family together  Visit Type Initial;Spiritual support  Referral From Nurse  Consult/Referral To Chaplain  Spiritual Encounters  Spiritual Needs Prayer;Emotional;Grief support   CH received an OR to pray with Michael Wright. Upon entering the room I meet the patient's son and two daughters. The patient was sleeping, but the oldest daughter woke him up because she wanted me to pray for him and for him to know it. I spoke briefly with the patient concerning his current health and his wife who recently past (30 Dec 2017) due to cancer. He was married for 92 years and misses his wife as can be expected. I prayed for the patient's health and ability to be home soon.

## 2018-04-21 NOTE — Clinical Social Work Note (Signed)
Clinical Social Work Assessment  Patient Details  Name: Michael Wright MRN: [id number] Date of Birth: Jan 11, 1941  Date of referral:  04/21/18               Reason for consult:  Facility Placement                Permission sought to share information with:  Case Manager, Customer service manager, Family Supports Permission granted to share information::  Yes, Verbal Permission Granted  Name::      SNF  Agency::   [facility]   Relationship::     Contact Information:     Housing/Transportation Living arrangements for the past 2 months:  [place] of Information:  Adult Children, Patient Patient Interpreter Needed:  None Criminal Activity/Legal Involvement Pertinent to Current Situation/Hospitalization:  No - Comment as needed Significant Relationships:  Adult Children Lives with:  Self Do you feel safe going back to the place where you live?  Yes Need for family participation in patient care:  Yes (Comment)  Care giving concerns:  Patient lives alone    Social Worker assessment / plan:  CSW consulted for facility placement. CSW met with patient and daughters Michael Wright and Michael Wright at bedside. Patient states that he lives by himself and would prefer to go home. CSW explained PT recommendation. Patient states that he will think about it and consider going because his daughter are requesting he go. Daughters state that patient's wife died in Jan 28, 2023 of this year and he has been on his own since then. Patient and family prefer [facility] if patient needs SNF. CSW initiated bed search and will continue to follow for discharge planning.   Employment status:  Retired Nurse, adult PT Recommendations:  [place] / Referral to community resources:  [place]  Patient/Family's Response to care:  Patient and family thanked CSW for assistance   Patient/Family's Understanding of  and Emotional Response to Diagnosis, Current Treatment, and Prognosis:  Family in agreement with discharge plan   Emotional Assessment Appearance:  Appears stated age Attitude/Demeanor/Rapport:  Engaged Affect (typically observed):  Pleasant, Restless Orientation:  Oriented to Self, Oriented to Place, Oriented to  Time, Oriented to Situation Alcohol / Substance use:  Not Applicable Psych involvement (Current and /or in the community):  No (Comment)  Discharge Needs  Concerns to be addressed:  Discharge Planning Concerns Readmission within the last 30 days:  No Current discharge risk:  None Barriers to Discharge:  Continued Medical Work up   [employer], [place] 04/21/2018, 12:15 PM

## 2018-04-21 NOTE — Progress Notes (Signed)
Results for Michael Wright, Michael Wright (MRN [id number]) as of 04/21/2018 15:21  Ref. Range 04/19/2018 22:24  Hemoglobin A1C Latest Ref Range: 4.8 - 5.6 % 10.7 (H)    Spoke with patient's family (daughter and son present) about pt's current A1c of 10.7% (pt having some confusion and not able to have meaningful conversation per RN and family at this time).       Explained what an A1c is and what it measures.  Reminded patient's family that pt's goal A1c is 7% or less per ADA standards to prevent both acute and long-term complications.  Explained to patient's family the extreme importance of good glucose control at home.    Patient's family told me that pt has been under a lot of emotional stress since pt's wife passed away in 2023/02/13.  Pt has been eating a lot of junk food (especially ice cream) per family members.  Daughter fills pill boxes for pt but family thinks pt likely not taking meds on regular basis since pills are leftover in the boxes.  Note plans for pt to go to SNF for Rehab after d/c.  Discussed with family that the staff at the SNF will check pt's CBGs and give pt meds as ordered.  Discussed w/ family that if pt comes home after Rehab that they will need to make sure he is checking CBGs and taking meds.  Family stated understanding.    --Will follow patient during hospitalization--  Wyn Quaker RN, MSN, CDE Diabetes Coordinator Inpatient Glycemic Control Team Team Pager: [phone] (8a-5p)

## 2018-04-21 NOTE — Progress Notes (Signed)
Lovenox changed to 30 mg daily for BMI <40 and CrCl <30 

## 2018-04-21 NOTE — Progress Notes (Signed)
Spoke to patient about setting up CPAP bedside at bedtime. Patient refused. Spoke to patient again during his 0200 treatment time and patient refused CPAP again.  RN aware. Will continue to monitor.

## 2018-04-21 NOTE — Progress Notes (Signed)
Pt is refusing to wear Cpap.

## 2018-04-21 NOTE — Clinical Social Work Placement (Signed)
   CLINICAL SOCIAL WORK PLACEMENT  NOTE  Date:  04/21/2018  Patient Details  Name: Michael Wright MRN: [id number] Date of Birth: 04-21-1941  Clinical Social Work is seeking post-discharge placement for this patient at the [place] level of care (*CSW will initial, date and re-position this form in  chart as items are completed):  Yes   Patient/family provided with [place] Work Department's list of facilities offering this level of care within the geographic area requested by the patient (or if unable, by the patient's family).  Yes   Patient/family informed of their freedom to choose among providers that offer the needed level of care, that participate in Medicare, Medicaid or managed care program needed by the patient, have an available bed and are willing to accept the patient.  Yes   Patient/family informed of [place]'s ownership interest in [facility] and [facility], as well as of the fact that they are under no obligation to receive care at these facilities.  PASRR submitted to EDS on 04/21/18     PASRR number received on 04/21/18     Existing PASRR number confirmed on       FL2 transmitted to all facilities in geographic area requested by pt/family on 04/21/18     FL2 transmitted to all facilities within larger geographic area on       Patient informed that his/her managed care company has contracts with or will negotiate with certain facilities, including the following:        Yes   Patient/family informed of bed offers received.  Patient chooses bed at [facility]     Physician recommends and patient chooses bed at [facility])    Patient to be transferred to ([facility]  on  .  Patient to be transferred to facility by       Patient family notified on   of transfer.  Name of family member notified:        PHYSICIAN       Additional Comment:     _______________________________________________ [name], [place] 04/21/2018, 1:36 PM

## 2018-04-21 NOTE — Clinical Social Work Note (Signed)
CSW gave bed offers to patient and family in room. Patient and family chose [facility] for SNF. CSW notified Tina, [facility] liaison of bed acceptance. Otila Kluver is starting [employer] (traditional) authorization. CSW will continue to follow for discharge planning.   [place], [place]

## 2018-04-21 NOTE — Progress Notes (Signed)
CRITICAL VALUE ALERT  Critical Value: Lactic acid 2.1  Date & Time Notied: 04/21/18 03:45 am  Provider Notified: Marcille Blanco  Orders Received/Actions taken: see new order

## 2018-04-21 NOTE — Evaluation (Signed)
Physical Therapy Evaluation Patient Details Name: Michael Wright MRN: [id number] DOB: December 11, 1940 Today's Date: 04/21/2018   History of Present Illness  This is a 77 year old male presented to the ED with worsening shortness of breath and AMS.  He  has a past medical history of CKD. Anemia, BPH, Cough, Dementia, Diabetes mellitus, type 2 ([place]), Gout, Hyperlipidemia, Hypertension, and Kidney stones. Dx of Acute CHF.  Clinical Impression  On PT arrival pt was A+O x 4, but struggled to recall birth year. Pt expressed frustration with this and states that he is always forgetting things. Pt daughter is at bedside and says he has been more forgetful over last 2-3 days. Pt occasionally loses train of thought and forgets therapist's question. Pt expressed emotional pain over loss of wife in may. Pt has been living alone since then and per family had multiple falls soon after her passing. Pt family reports he went to neurologist who they reports said " all sorts of things are wrong with his nerves". Family reports they then had a pastor pray over pt, and since he has not had any "bad" falls. Pt was min assist with all bed mobility and transfers primarily due to balance difficulties and impulsivity. Pt was able to ambulate 100 ft w/RW needing min assist to maintain balance several times. Pt SpO2 stayed above 90% w/ ambulation 3L 02 [place] (up from resting 2.5 L O2 due to tank preset limitations). Pt has vision difficulties (bilat cataracts according to family) which seems to contribute to balance issues. Pt will have no help at discharge--per family. Pt is not safe to ambulate and move ind at home at this time due to balance difficulties. Pt will need skilled PT to address above deficits and promote optimal return to PLOF. As pt medical condition improves balance may also, however at this time recommend transition to STR upon discharge from acute hospitalization.    Follow Up Recommendations SNF    Equipment  Recommendations  Rolling walker with 5" wheels    Recommendations for Other Services OT consult     Precautions / Restrictions        Mobility  Bed Mobility Overal bed mobility: Needs Assistance Bed Mobility: Supine to Sit     Supine to sit: Min assist     General bed mobility comments: Pt able to initiate and begin movement, however needs assitance ot find midline effectively and balance at EOB.   Transfers Overall transfer level: Needs assistance Equipment used: Rolling walker (2 wheeled) Transfers: Sit to/from Stand Sit to Stand: Min assist         General transfer comment: Pt BLE strong and able to conplte movment without need for assist however pt needs support from therapist to maintain balance as he rises. Pt moves very impulisvely and quickly.   Ambulation/Gait Ambulation/Gait assistance: Min assist Gait Distance (Feet): 100 Feet Assistive device: Rolling walker (2 wheeled) Gait Pattern/deviations: Step-through pattern;Wide base of support Gait velocity: Pt moved faster than was safe for his balance deficits.    General Gait Details: Pt has increased sway side to side with gait, pt has multiple LOB events during walking that he does not seem to notice--needing PT min assist with some self correction. Pt also seems to be unaware of enviroment due to decreased vision.   Stairs            Wheelchair Mobility    Modified Rankin (Stroke Patients Only)       Balance Overall balance assessment: Needs assistance  Sitting-balance support: Feet supported;No upper extremity supported Sitting balance-Leahy Scale: Good Sitting balance - Comments: Pt sitting balnce is good once he comes to full sitting position. Iniitially from supine position needed therapist help to find midline.    Standing balance support: No upper extremity supported Standing balance-Leahy Scale: Good Standing balance comment: Pt static balance is good. Can except mod perturbations.   Single Leg Stance - Right Leg: 1 Single Leg Stance - Left Leg: 2 Tandem Stance - Right Leg: 5   [place] - Eyes Opened: 30 [place] - Eyes Closed: 4 High level balance activites: Head turns;Direction changes High Level Balance Comments: Pt struggles with head turns, losing balance almost immediately with activity. Pt moves impulsively while walking and when told to turn shows poor motor planning to do so and has LOB             Pertinent Vitals/Pain Pain Assessment: No/denies pain    Home Living Family/patient expects to be discharged to:: Private residence Living Arrangements: Alone Available Help at Discharge: Other (Comment);Family(Family unable to assist daily due to work schedules) Type of Home: House Home Access: [place]: Multi-level;Able to live on main level with bedroom/bathroom Home Equipment: Shower seat;Grab bars - tub/shower;Walker - 4 wheels;Cane - single point      Prior Function Level of Independence: Independent with assistive device(s)         Comments: Pt reports prior to this hospitilization he was ind w/ SPC for mobilty and ADL's. Pt has been living alone since wifes death in 01/25/2018. Pt and family reports falling often, b/c his "knees give out".      Hand Dominance        Extremity/Trunk Assessment   Upper Extremity Assessment Upper Extremity Assessment: Overall WFL for tasks assessed    Lower Extremity Assessment Lower Extremity Assessment: Overall WFL for tasks assessed    Cervical / Trunk Assessment Cervical / Trunk Assessment: Kyphotic  Communication   Communication: Other (comment)(Pt has bilat cataracts that severly limits vision. )  Cognition Arousal/Alertness: Awake/alert Behavior During Therapy: WFL for tasks assessed/performed(Pt expressses sadness and feelings of depression due to wifes passing. ) Overall Cognitive Status: Impaired/Different from baseline Area of Impairment:  Memory;Safety/judgement                     Memory: Decreased short-term memory(difficulty with recal of birth year. )   Safety/Judgement: Decreased awareness of deficits;Decreased awareness of safety     General Comments: Pt has baseline hx of dementia but pt family reports he is much worse over the last week or so.       General Comments      Exercises Other Exercises Other Exercises: Bed mobility: Supine to sit min assist, Trasfers: Sit to stand w/rw min assist; Ambulation: 175ft w/RW min assist Other Exercises: AROM/MMT BUE: Shoulder flex/abd elbow ext/flex, grip BLE: hip flex, knee flex/ext, ankle PF/DF   Assessment/Plan    PT Assessment Patient needs continued PT services  PT Problem List Decreased mobility;Decreased safety awareness;Decreased coordination;Decreased activity tolerance;Decreased cognition;Cardiopulmonary status limiting activity;Decreased knowledge of use of DME;Decreased balance       PT Treatment Interventions DME instruction;Therapeutic activities;Gait training;Therapeutic exercise;Stair training;Balance training;Functional mobility training;Neuromuscular re-education;Patient/family education    PT Goals (Current goals can be found in the Care Plan section)  Acute Rehab PT Goals PT Goal Formulation: Patient unable to participate in goal setting(Pt emotionally struggled to express goal at this time')  Frequency Min 2X/week   Barriers to discharge Decreased caregiver support      Co-evaluation               AM-PAC PT "6 Clicks" Daily Activity  Outcome Measure Difficulty turning over in bed (including adjusting bedclothes, sheets and blankets)?: A Lot Difficulty moving from lying on back to sitting on the side of the bed? : Unable Difficulty sitting down on and standing up from a chair with arms (e.g., wheelchair, bedside commode, etc,.)?: Unable Help needed moving to and from a bed to chair (including a wheelchair)?: A Little Help  needed walking in hospital room?: A Little Help needed climbing 3-5 steps with a railing? : A Lot 6 Click Score: 12    End of Session Equipment Utilized During Treatment: Gait belt;Oxygen(3L O2 while walking--due to O2 cannister preset limitations) Activity Tolerance: Patient tolerated treatment well;Other (comment);Treatment limited secondary to medical complications (Comment)(Pt cataracts, dementia) Patient left: in chair;with chair alarm set;with family/visitor present;with call bell/phone within reach Nurse Communication: Mobility status;Precautions;Other (comment)(Alerted nurse to pt pulling at IV) PT Visit Diagnosis: Unsteadiness on feet (R26.81);Repeated falls (R29.6);History of falling (Z91.81);Other symptoms and signs involving the nervous system (T70.177)    Time: 9390-3009 PT Time Calculation (min) (ACUTE ONLY): 45 min   Charges:              Hortencia Conradi, SPT 04/21/18,12:29 PM

## 2018-04-22 LAB — BASIC METABOLIC PANEL
ANION GAP: 8 (ref 5–15)
BUN: 49 mg/dL — ABNORMAL HIGH (ref 8–23)
CALCIUM: 7.9 mg/dL — AB (ref 8.9–10.3)
CO2: 33 mmol/L — AB (ref 22–32)
Chloride: 100 mmol/L (ref 98–111)
Creatinine, Ser: 1.78 mg/dL — ABNORMAL HIGH (ref 0.61–1.24)
GFR calc Af Amer: 41 mL/min — ABNORMAL LOW (ref >60)
GFR calc non Af Amer: 35 mL/min — ABNORMAL LOW (ref >60)
GLUCOSE: 214 mg/dL — AB (ref 70–99)
Potassium: 3.5 mmol/L (ref 3.5–5.1)
Sodium: 141 mmol/L (ref 135–145)

## 2018-04-22 LAB — GLUCOSE, CAPILLARY
GLUCOSE-CAPILLARY: 280 mg/dL — AB (ref 70–99)
GLUCOSE-CAPILLARY: 284 mg/dL — AB (ref 70–99)
Glucose-Capillary: 198 mg/dL — ABNORMAL HIGH (ref 70–99)
Glucose-Capillary: 237 mg/dL — ABNORMAL HIGH (ref 70–99)

## 2018-04-22 MED ORDER — GLIMEPIRIDE 2 MG PO TABS
2.0000 mg | ORAL_TABLET | Freq: Every day | ORAL | Status: DC
  Administered 2018-04-23 – 2018-04-26 (×4): 2 mg via ORAL
  Filled 2018-04-22 (×4): qty 1

## 2018-04-22 MED ORDER — INSULIN GLARGINE 100 UNIT/ML ~~LOC~~ SOLN
17.0000 [IU] | Freq: Every day | SUBCUTANEOUS | Status: DC
  Administered 2018-04-22: 17 [IU] via SUBCUTANEOUS
  Filled 2018-04-22 (×2): qty 0.17

## 2018-04-22 MED ORDER — DONEPEZIL HCL 5 MG PO TABS
10.0000 mg | ORAL_TABLET | Freq: Every day | ORAL | Status: DC
  Administered 2018-04-22 – 2018-04-25 (×4): 10 mg via ORAL
  Filled 2018-04-22 (×5): qty 2

## 2018-04-22 MED ORDER — ENOXAPARIN SODIUM 40 MG/0.4ML ~~LOC~~ SOLN
40.0000 mg | Freq: Every day | SUBCUTANEOUS | Status: DC
  Administered 2018-04-22 – 2018-04-25 (×4): 40 mg via SUBCUTANEOUS
  Filled 2018-04-22 (×4): qty 0.4

## 2018-04-22 NOTE — Progress Notes (Signed)
Increase to 3l after neb

## 2018-04-22 NOTE — Progress Notes (Signed)
Patient ID: [name], male   DOB: 03-19-1941, 77 y.o.   MRN: [id number]   [facility] PROGRESS NOTE  ELISANDRO JARRETT EYC:[id number] DOB: November 01, 1940 DOA: 04/19/2018 PCP: [place] Pink, MD  HPI/Subjective: Patient feeling okay.  Still has a little bit of shortness of breath.  Some cough.  Feels better than when he came in  Objective: Vitals:   04/22/18 0350 04/22/18 0724  BP: 102/80   Pulse: 83   Resp: 20   Temp: 98.8 F (37.1 C)   SpO2: 93% (!) 87%    Filed Weights   04/19/18 1453 04/19/18 2106  Weight: 101.6 kg 104 kg    ROS: Review of Systems  Constitutional: Negative for chills and fever.  Eyes: Negative for blurred vision.  Respiratory: Positive for cough and shortness of breath.   Cardiovascular: Negative for chest pain.  Gastrointestinal: Negative for abdominal pain, constipation, diarrhea, nausea and vomiting.  Genitourinary: Negative for dysuria.  Musculoskeletal: Negative for joint pain.  Neurological: Negative for dizziness and headaches.   Exam: Physical Exam  HENT:  Nose: No mucosal edema.  Mouth/Throat: No oropharyngeal exudate or posterior oropharyngeal edema.  Eyes: Pupils are equal, round, and reactive to light. Conjunctivae, EOM and lids are normal.  Neck: No JVD present. Carotid bruit is not present. No edema present. No thyroid mass and no thyromegaly present.  Cardiovascular: S1 normal and S2 normal. Exam reveals no gallop.  No murmur heard. Pulses:      Dorsalis pedis pulses are 2+ on the right side, and 2+ on the left side.  Respiratory: No respiratory distress. He has decreased breath sounds in the right lower field and the left lower field. He has no wheezes. He has no rhonchi. He has no rales.  GI: Soft. Bowel sounds are normal. There is no tenderness.  Musculoskeletal:       Right ankle: He exhibits swelling.       Left ankle: He exhibits swelling.  Lymphadenopathy:    He has no cervical adenopathy.  Neurological: He is  alert. No cranial nerve deficit.  Skin: Skin is warm. No rash noted. Nails show no clubbing.  Psychiatric: He has a normal mood and affect.      Data Reviewed: Basic Metabolic Panel: Recent Labs  Lab 04/19/18 1456 04/21/18 0301 04/22/18 0504  NA 137 143 141  K 3.8 3.6 3.5  CL 97* 98 100  CO2 29 34* 33*  GLUCOSE 453* 142* 214*  BUN 22 52* 49*  CREATININE 1.46* 2.60* 1.78*  CALCIUM 8.7* 8.4* 7.9*   Liver Function Tests: Recent Labs  Lab 04/19/18 1456  AST 25  ALT 22  ALKPHOS 56  BILITOT 1.4*  PROT 7.3  ALBUMIN 3.5   CBC: Recent Labs  Lab 04/19/18 1456 04/21/18 0301  WBC 13.9* 17.5*  HGB 15.0 14.3  HCT 46.3 43.8  MCV 94.0 93.8  PLT 236 240   Cardiac Enzymes: Recent Labs  Lab 04/19/18 1456 04/19/18 2224 04/20/18 0239  TROPONINI 0.08* 0.11* 0.10*   BNP (last 3 results) Recent Labs    04/19/18 1728  BNP 115.0*     CBG: Recent Labs  Lab 04/21/18 1154 04/21/18 1640 04/21/18 2057 04/22/18 0734 04/22/18 1201  GLUCAP 229* 189* 160* 198* 237*    Recent Results (from the past 240 hour(s))  MRSA PCR Screening     Status: None   Collection Time: 04/19/18  9:11 PM  Result Value Ref Range Status   MRSA by PCR NEGATIVE NEGATIVE Final  Comment:        The GeneXpert MRSA Assay (FDA approved for NASAL specimens only), is one component of a comprehensive MRSA colonization surveillance program. It is not intended to diagnose MRSA infection nor to guide or monitor treatment for MRSA infections. Performed at [facility], [address] [place], [place] [address]       Scheduled Meds: . allopurinol  100 mg Oral Daily  . amoxicillin-clavulanate  1 tablet Oral BID  . aspirin  325 mg Oral Daily  . budesonide (PULMICORT) nebulizer solution  0.5 mg Nebulization BID  . chlorhexidine  15 mL Mouth Rinse BID  . donepezil  10 mg Oral QHS  . enoxaparin (LOVENOX) injection  40 mg Subcutaneous QHS  . finasteride  5 mg Oral Daily  .  gemfibrozil  600 mg Oral BID AC  . [START ON 04/23/2018] glimepiride  2 mg Oral Q breakfast  . insulin aspart  0-15 Units Subcutaneous TID WC  . insulin aspart  0-5 Units Subcutaneous QHS  . insulin glargine  17 Units Subcutaneous QHS  . ipratropium-albuterol  3 mL Nebulization Q6H  . loratadine  10 mg Oral Daily  . mouth rinse  15 mL Mouth Rinse q12n4p  . pantoprazole  40 mg Oral Daily  . pravastatin  40 mg Oral QHS  . traZODone  50 mg Oral QHS   Continuous Infusions:   Assessment/Plan:  1. Acute hypoxic and hypercarbic respiratory failure.  Patient initially required BiPAP in the emergency room.  Patient now on nasal cannula 2.5 L.  2. Acute diastolic congestive heart failure.  Holding Lasix secondary to overdiuresis and hypotension.  Hopefully will be able to start a low-dose Lasix and Coreg once blood pressure improves. 3. Acute kidney injury on chronic kidney disease stage III likely from overdiuresis and hypotension.  IV fluids stopped this morning.  Creatinine improved from 2.6 down to 1.78.  Baseline creatinine around 1.4.  Recheck creatinine tomorrow 4. Type 2 diabetes mellitus.  Increase Lantus insulin to 17 units and restart once a day Amaryl.  Hemoglobin A1c elevated at 10.7 5. Procalcitonin slightly elevated, lactic acid increased.  Empiric Augmentin started 6. BPH on medication 7. History of gout on allopurinol 8. Elevated troponin demand ischemia from acute hypoxic respiratory failure and CHF 9. Weakness.  Physical therapy recommended rehab.  Not sure if the patient is going to be able to take care of himself at home with his underlying dementia and his wife recently passing away around 6 weeks ago.  May end up needing to make longer-term plans after he completes rehab. 10. Dementia.  On Aricept.  Check a B12 and TSH  Code Status:  Code Status History    Date Active Date Inactive Code Status Order ID Comments User Context   05/15/2015 2350 05/16/2015 1949 Full Code  [id number]  Lance Coon, MD ED     Family Communication: Spoke with daughter [name] at the bedside Disposition Plan: Potentially out to rehab over the weekend  Antibiotics:  Augmentin  Time spent: 28 minutes  [place]

## 2018-04-22 NOTE — Progress Notes (Signed)
Increased to 3l after neb due to sats of 86% on two liters prior to neb treatment.

## 2018-04-22 NOTE — Care Management Important Message (Signed)
Important Message  Patient Details  Name: KELDRIC POYER MRN: [id number] Date of Birth: 1941/06/29   Medicare Important Message Given:  Yes    Juliann Pulse A Paxton Binns 04/22/2018, 1:09 PM

## 2018-04-22 NOTE — Progress Notes (Addendum)
Lovenox changed to 40 mg daily due to renal improvement  Paticia Stack, PharmD Pharmacy Resident  04/22/2018 9:06 AM

## 2018-04-22 NOTE — Progress Notes (Signed)
Inpatient Diabetes Program Recommendations  AACE/ADA: New Consensus Statement on Inpatient Glycemic Control (2015)  Target Ranges:  Prepandial:   less than 140 mg/dL      Peak postprandial:   less than 180 mg/dL (1-2 hours)      Critically ill patients:  140 - 180 mg/dL   Results for COULTON, SCHLINK (MRN [id number]) as of 04/22/2018 08:17  Ref. Range 04/21/2018 07:37 04/21/2018 11:54 04/21/2018 16:40 04/21/2018 20:57  Glucose-Capillary Latest Ref Range: 70 - 99 mg/dL 158 (H)  3 units NOVOLOG  229 (H)  5 units NOVOLOG  189 (H)  3 units NOVOLOG  160 (H)    15 units LANTUS   Results for RAYLAN, HANTON (MRN [id number]) as of 04/22/2018 08:17  Ref. Range 04/22/2018 07:34  Glucose-Capillary Latest Ref Range: 70 - 99 mg/dL 198 (H)    Home DM Meds: Amaryl 2 mg BID       Actos 15 mg daily       Metformin 500 mg BID  Current Orders: Lantus 15 units QHS      Novolog Moderate Correction Scale/ SSI (0-15 units) TID AC + HS     MD- Please consider the following in-hospital insulin adjustments:  1. Increase Lantus slightly to 17 units QHS  2. May want to restart 50% pt's home dose of Amaryl: Amaryl 2 mg daily (takes 2 mg BID at home)     --Will follow patient during hospitalization--  Wyn Quaker RN, MSN, CDE Diabetes Coordinator Inpatient Glycemic Control Team Team Pager: [phone] (8a-5p)

## 2018-04-23 ENCOUNTER — Inpatient Hospital Stay: Payer: Medicare HMO

## 2018-04-23 LAB — BASIC METABOLIC PANEL
ANION GAP: 7 (ref 5–15)
BUN: 32 mg/dL — ABNORMAL HIGH (ref 8–23)
CHLORIDE: 101 mmol/L (ref 98–111)
CO2: 33 mmol/L — AB (ref 22–32)
Calcium: 8.2 mg/dL — ABNORMAL LOW (ref 8.9–10.3)
Creatinine, Ser: 1.25 mg/dL — ABNORMAL HIGH (ref 0.61–1.24)
GFR calc non Af Amer: 54 mL/min — ABNORMAL LOW (ref >60)
Glucose, Bld: 249 mg/dL — ABNORMAL HIGH (ref 70–99)
Potassium: 3.9 mmol/L (ref 3.5–5.1)
Sodium: 141 mmol/L (ref 135–145)

## 2018-04-23 LAB — BLOOD GAS, VENOUS
Acid-Base Excess: 7.7 mmol/L — ABNORMAL HIGH (ref 0.0–2.0)
BICARBONATE: 38 mmol/L — AB (ref 20.0–28.0)
O2 Saturation: 51.1 %
PATIENT TEMPERATURE: 37
pCO2, Ven: 79 mmHg (ref 44.0–60.0)
pH, Ven: 7.29 (ref 7.250–7.430)

## 2018-04-23 LAB — GLUCOSE, CAPILLARY
GLUCOSE-CAPILLARY: 245 mg/dL — AB (ref 70–99)
GLUCOSE-CAPILLARY: 200 mg/dL — AB (ref 70–99)
GLUCOSE-CAPILLARY: 204 mg/dL — AB (ref 70–99)
Glucose-Capillary: 244 mg/dL — ABNORMAL HIGH (ref 70–99)

## 2018-04-23 LAB — TSH: TSH: 0.153 u[IU]/mL — ABNORMAL LOW (ref 0.350–4.500)

## 2018-04-23 LAB — VITAMIN B12: VITAMIN B 12: 808 pg/mL (ref 180–914)

## 2018-04-23 MED ORDER — INSULIN GLARGINE 100 UNIT/ML ~~LOC~~ SOLN
22.0000 [IU] | Freq: Every day | SUBCUTANEOUS | Status: DC
  Administered 2018-04-23: 22:00:00 22 [IU] via SUBCUTANEOUS
  Filled 2018-04-23 (×2): qty 0.22

## 2018-04-23 MED ORDER — FUROSEMIDE 20 MG PO TABS
20.0000 mg | ORAL_TABLET | Freq: Every day | ORAL | Status: DC
  Administered 2018-04-23 – 2018-04-24 (×2): 20 mg via ORAL
  Filled 2018-04-23 (×2): qty 1

## 2018-04-23 NOTE — Progress Notes (Signed)
[facility] [place] NAME: Michael Wright    MR#:  [id number]  DATE OF BIRTH:  1941/04/28  SUBJECTIVE:  CHIEF COMPLAINT:   Chief Complaint  Patient presents with  . Shortness of Breath   - still short of breath, remains on 3L o2 this morning  REVIEW OF SYSTEMS:  Review of Systems  Constitutional: Negative for chills and fever.  HENT: Negative for congestion, ear discharge, hearing loss and nosebleeds.   Eyes: Negative for blurred vision and double vision.  Respiratory: Positive for shortness of breath. Negative for cough and wheezing.   Cardiovascular: Negative for chest pain and palpitations.  Gastrointestinal: Negative for abdominal pain, constipation, diarrhea, nausea and vomiting.  Genitourinary: Negative for dysuria.  Musculoskeletal: Negative for myalgias.  Neurological: Negative for dizziness, focal weakness, seizures, weakness and headaches.  Psychiatric/Behavioral: Negative for depression.    DRUG ALLERGIES:  No Known Allergies  VITALS:  Blood pressure (!) 161/67, pulse 94, temperature 97.7 F (36.5 C), temperature source Oral, resp. rate 20, height 5\' 6"  (1.676 m), weight 104 kg, SpO2 91 %.  PHYSICAL EXAMINATION:  Physical Exam  GENERAL:  77 y.o.-year-old patient lying in the bed with no acute distress.  EYES: Pupils equal, round, reactive to light and accommodation. No scleral icterus. Extraocular muscles intact.  HEENT: Head atraumatic, normocephalic. Oropharynx and nasopharynx clear.  NECK:  Supple, no jugular venous distention. No thyroid enlargement, no tenderness.  LUNGS: Normal breath sounds bilaterally, no wheezing, rales,rhonchi or crepitation. No use of accessory muscles of respiration. Decreased bibasilar breath sounds CARDIOVASCULAR: S1, S2 normal. No  rubs, or gallops. 2/6 systolic murmur present ABDOMEN: Soft, nontender, nondistended. Bowel sounds present. No organomegaly or mass.  EXTREMITIES: No  pedal edema, cyanosis, or clubbing.  NEUROLOGIC: Cranial nerves II through XII are intact. Muscle strength 5/5 in all extremities. Sensation intact. Gait not checked.  PSYCHIATRIC: The patient is alert and oriented x 3.  SKIN: No obvious rash, lesion, or ulcer.    LABORATORY PANEL:   CBC Recent Labs  Lab 04/21/18 0301  WBC 17.5*  HGB 14.3  HCT 43.8  PLT 240   ------------------------------------------------------------------------------------------------------------------  Chemistries  Recent Labs  Lab 04/19/18 1456  04/23/18 0413  NA 137   < > 141  K 3.8   < > 3.9  CL 97*   < > 101  CO2 29   < > 33*  GLUCOSE 453*   < > 249*  BUN 22   < > 32*  CREATININE 1.46*   < > 1.25*  CALCIUM 8.7*   < > 8.2*  AST 25  --   --   ALT 22  --   --   ALKPHOS 56  --   --   BILITOT 1.4*  --   --    < > = values in this interval not displayed.   ------------------------------------------------------------------------------------------------------------------  Cardiac Enzymes Recent Labs  Lab 04/20/18 0239  TROPONINI 0.10*   ------------------------------------------------------------------------------------------------------------------  RADIOLOGY:  No results found.  EKG:   Orders placed or performed during the hospital encounter of 04/19/18  . ED EKG  . ED EKG    ASSESSMENT AND PLAN:   77 year old male with past medical history significant for dementia, diabetes, hypertension, BPH, presents from home due to altered mental status and dyspnea  1.  Acute hypoxic respiratory failure-secondary to acute on chronic diastolic CHF exacerbation -Echocardiogram with EF of 55%.  Patient was diuresed with IV Lasix.  Lasix was  held due to acute renal failure. -Restarting oral Lasix today.  Patient still requiring 3 L oxygen. -Repeat chest x-ray today.   - CT of the chest on admission did not show any pulmonary embolism, has cardiomegaly and pulmonary congestion.  2. ARF on CKD  stage 3- baseline cr at 1.4 Improving cr, now at 1.2 now -off IV fluids, restarting lasix today  3.  Diabetes mellitus-continue Lantus and on sliding scale insulin -Increase the dose of Lantus as sugars still elevated  4.  Dementia-on Aricept.  Alert and oriented, pleasantly confused  5.  DVT prophylaxis-Lovenox  PT recommended rehab    All the records are reviewed and case discussed with Care Management/Social Workerr. Management plans discussed with the patient, family and they are in agreement.  CODE STATUS: Full Code  TOTAL TIME TAKING CARE OF THIS PATIENT: 33 minutes.   POSSIBLE D/C IN 2 DAYS, DEPENDING ON CLINICAL CONDITION.   Gladstone Lighter M.D on 04/23/2018 at 1:33 PM  Between 7am to 6pm - Pager - [phone]  After 6pm go to [email or web address] - password [email or web address] [facility]  Office  [phone]  CC: Primary care physician; Maryland Pink, MD

## 2018-04-24 LAB — BASIC METABOLIC PANEL
Anion gap: 5 (ref 5–15)
BUN: 25 mg/dL — AB (ref 8–23)
CHLORIDE: 100 mmol/L (ref 98–111)
CO2: 35 mmol/L — AB (ref 22–32)
CREATININE: 1.53 mg/dL — AB (ref 0.61–1.24)
Calcium: 8.3 mg/dL — ABNORMAL LOW (ref 8.9–10.3)
GFR calc Af Amer: 49 mL/min — ABNORMAL LOW (ref >60)
GFR calc non Af Amer: 42 mL/min — ABNORMAL LOW (ref >60)
Glucose, Bld: 240 mg/dL — ABNORMAL HIGH (ref 70–99)
POTASSIUM: 4.1 mmol/L (ref 3.5–5.1)
Sodium: 140 mmol/L (ref 135–145)

## 2018-04-24 LAB — CBC
HEMATOCRIT: 44.2 % (ref 40.0–52.0)
HEMOGLOBIN: 14.3 g/dL (ref 13.0–18.0)
MCH: 30.3 pg (ref 26.0–34.0)
MCHC: 32.4 g/dL (ref 32.0–36.0)
MCV: 93.6 fL (ref 80.0–100.0)
Platelets: 180 10*3/uL (ref 150–440)
RBC: 4.73 MIL/uL (ref 4.40–5.90)
RDW: 13.7 % (ref 11.5–14.5)
WBC: 10.6 10*3/uL (ref 3.8–10.6)

## 2018-04-24 LAB — GLUCOSE, CAPILLARY
GLUCOSE-CAPILLARY: 249 mg/dL — AB (ref 70–99)
GLUCOSE-CAPILLARY: 254 mg/dL — AB (ref 70–99)
Glucose-Capillary: 223 mg/dL — ABNORMAL HIGH (ref 70–99)
Glucose-Capillary: 229 mg/dL — ABNORMAL HIGH (ref 70–99)

## 2018-04-24 MED ORDER — INSULIN GLARGINE 100 UNIT/ML ~~LOC~~ SOLN
27.0000 [IU] | Freq: Every day | SUBCUTANEOUS | Status: DC
  Administered 2018-04-24: 27 [IU] via SUBCUTANEOUS
  Filled 2018-04-24 (×2): qty 0.27

## 2018-04-24 MED ORDER — FUROSEMIDE 10 MG/ML IJ SOLN
40.0000 mg | Freq: Two times a day (BID) | INTRAMUSCULAR | Status: DC
  Administered 2018-04-24 – 2018-04-25 (×4): 40 mg via INTRAVENOUS
  Filled 2018-04-24 (×4): qty 4

## 2018-04-24 NOTE — Progress Notes (Signed)
Michael Wright at [place] NAME: Michael Wright    MR#:  [id number]  DATE OF BIRTH:  [birth year]  SUBJECTIVE:  CHIEF COMPLAINT:   Chief Complaint  Patient presents with  . Shortness of Breath   -Still very short of breath, chest x-ray showing worsening edema. -Lasix started.  Daughter at bedside today  REVIEW OF SYSTEMS:  Review of Systems  Constitutional: Negative for chills and fever.  HENT: Negative for congestion, ear discharge, hearing loss and nosebleeds.   Eyes: Negative for blurred vision and double vision.  Respiratory: Positive for shortness of breath. Negative for cough and wheezing.   Cardiovascular: Negative for chest pain and palpitations.  Gastrointestinal: Negative for abdominal pain, constipation, diarrhea, nausea and vomiting.  Genitourinary: Negative for dysuria.  Musculoskeletal: Negative for myalgias.  Neurological: Negative for dizziness, focal weakness, seizures, weakness and headaches.  Psychiatric/Behavioral: Negative for depression.    DRUG ALLERGIES:   Allergies  Allergen Reactions  . Tramadol Other (See Comments)    Causes lightheadedness.    VITALS:  Blood pressure 128/79, pulse 98, temperature 97.7 F (36.5 C), temperature source Oral, resp. rate (!) 24, height 5\' 6"  (1.676 m), weight 104 kg, SpO2 90 %.  PHYSICAL EXAMINATION:  Physical Exam  GENERAL:  77 y.o.-year-old patient lying in the bed with no acute distress.  EYES: Pupils equal, round, reactive to light and accommodation. No scleral icterus. Extraocular muscles intact.  HEENT: Head atraumatic, normocephalic. Oropharynx and nasopharynx clear.  NECK:  Supple, no jugular venous distention. No thyroid enlargement, no tenderness.  LUNGS: Normal breath sounds bilaterally, no wheezing, rhonchi or crepitation.  Increased coarse breath sounds at the bases.  No use of accessory muscles of respiration. Decreased bibasilar breath  sounds CARDIOVASCULAR: S1, S2 normal. No  rubs, or gallops. 2/6 systolic murmur present ABDOMEN: Soft, nontender, nondistended. Bowel sounds present. No organomegaly or mass.  EXTREMITIES: No pedal edema, cyanosis, or clubbing.  NEUROLOGIC: Cranial nerves II through XII are intact. Muscle strength 5/5 in all extremities. Sensation intact. Gait not checked.  PSYCHIATRIC: The patient is alert and oriented x 3.  SKIN: No obvious rash, lesion, or ulcer.    LABORATORY PANEL:   CBC Recent Labs  Lab 04/24/18 0454  WBC 10.6  HGB 14.3  HCT 44.2  PLT 180   ------------------------------------------------------------------------------------------------------------------  Chemistries  Recent Labs  Lab 04/19/18 1456  04/24/18 0454  NA 137   < > 140  K 3.8   < > 4.1  CL 97*   < > 100  CO2 29   < > 35*  GLUCOSE 453*   < > 240*  BUN 22   < > 25*  CREATININE 1.46*   < > 1.53*  CALCIUM 8.7*   < > 8.3*  AST 25  --   --   ALT 22  --   --   ALKPHOS 56  --   --   BILITOT 1.4*  --   --    < > = values in this interval not displayed.   ------------------------------------------------------------------------------------------------------------------  Cardiac Enzymes Recent Labs  Lab 04/20/18 0239  TROPONINI 0.10*   ------------------------------------------------------------------------------------------------------------------  RADIOLOGY:  Dg Chest 2 View  Result Date: 04/23/2018 CLINICAL DATA:  Shortness of breath. Clinical suspicion of congestive heart failure. EXAM: CHEST - 2 VIEW COMPARISON:  04/19/2018 FINDINGS: Cardiomegaly as seen previously. Interstitial densities are more prominent, worrisome for increased interstitial edema. Atelectasis at the lung bases is slightly worsened as well. Scattered  granulomas as seen previously. Minimal blunting of the posterior costophrenic angles. IMPRESSION: Worsened interstitial density suggesting worsening congestive heart failure. Worsening  also of some basilar atelectasis. Electronically Signed   By: Nelson Chimes M.D.   On: 04/23/2018 15:40    EKG:   Orders placed or performed during the hospital encounter of 04/19/18  . ED EKG  . ED EKG    ASSESSMENT AND PLAN:   77 year old male with past medical history significant for dementia, diabetes, hypertension, BPH, presents from home due to altered mental status and dyspnea  1.  Acute hypoxic respiratory failure-secondary to acute on chronic diastolic CHF exacerbation -Echocardiogram with EF of 55%.  Patient was diuresed with IV Lasix.  Lasix was held due to acute renal failure. -However his oxygen requirements have been increasing with increased tachypnea and work of breathing.  Repeat chest x-ray showing pulmonary edema -Restarted IV Lasix today. - CT of the chest on admission did not show any pulmonary embolism, has cardiomegaly and pulmonary congestion.  2. ARF on CKD stage 3- baseline cr at 1.4 Improving cr, now at 1.5-Lasix being restarted.  Off IV fluids. -No further worsening noted, will consult nephrology  3.  Diabetes mellitus-continue Lantus and on sliding scale insulin -Increase the dose of Lantus as sugars still elevated  4.  Dementia-on Aricept.  Alert and oriented, pleasantly confused  5.  DVT prophylaxis-Lovenox  PT recommended rehab Daughter updated at bedside    All the records are reviewed and case discussed with Care Management/Social Workerr. Management plans discussed with the patient, family and they are in agreement.  CODE STATUS: Full Code  TOTAL TIME TAKING CARE OF THIS PATIENT: 33 minutes.   POSSIBLE D/C IN 2 DAYS, DEPENDING ON CLINICAL CONDITION.   Olman Yono M.D on 04/24/2018 at 1:33 PM  Between 7am to 6pm - Pager - [phone]  After 6pm go to [email or web address] - password [email or web address] [facility]  Office  [phone]  CC: Primary care physician; [place] Pink, MD

## 2018-04-25 LAB — BASIC METABOLIC PANEL
ANION GAP: 6 (ref 5–15)
BUN: 26 mg/dL — AB (ref 8–23)
CALCIUM: 8.4 mg/dL — AB (ref 8.9–10.3)
CO2: 39 mmol/L — ABNORMAL HIGH (ref 22–32)
Chloride: 96 mmol/L — ABNORMAL LOW (ref 98–111)
Creatinine, Ser: 1.29 mg/dL — ABNORMAL HIGH (ref 0.61–1.24)
GFR calc Af Amer: >60 mL/min (ref >60)
GFR, EST NON AFRICAN AMERICAN: 52 mL/min — AB (ref >60)
GLUCOSE: 263 mg/dL — AB (ref 70–99)
POTASSIUM: 3.7 mmol/L (ref 3.5–5.1)
SODIUM: 141 mmol/L (ref 135–145)

## 2018-04-25 LAB — GLUCOSE, CAPILLARY
GLUCOSE-CAPILLARY: 552 mg/dL — AB (ref 70–99)
GLUCOSE-CAPILLARY: 203 mg/dL — AB (ref 70–99)
GLUCOSE-CAPILLARY: 218 mg/dL — AB (ref 70–99)
Glucose-Capillary: 185 mg/dL — ABNORMAL HIGH (ref 70–99)

## 2018-04-25 MED ORDER — IPRATROPIUM-ALBUTEROL 0.5-2.5 (3) MG/3ML IN SOLN: 3.0000 mL | Freq: Four times a day (QID) | RESPIRATORY_TRACT | Status: DC | PRN

## 2018-04-25 MED ORDER — INSULIN ASPART 100 UNIT/ML ~~LOC~~ SOLN
4.0000 [IU] | Freq: Three times a day (TID) | SUBCUTANEOUS | Status: DC
  Administered 2018-04-25 – 2018-04-26 (×3): 4 [IU] via SUBCUTANEOUS
  Filled 2018-04-25 (×3): qty 1

## 2018-04-25 MED ORDER — IPRATROPIUM-ALBUTEROL 0.5-2.5 (3) MG/3ML IN SOLN
3.0000 mL | Freq: Three times a day (TID) | RESPIRATORY_TRACT | Status: DC
  Administered 2018-04-25 – 2018-04-26 (×5): 3 mL via RESPIRATORY_TRACT
  Filled 2018-04-25 (×5): qty 3

## 2018-04-25 MED ORDER — INSULIN GLARGINE 100 UNIT/ML ~~LOC~~ SOLN
32.0000 [IU] | Freq: Every day | SUBCUTANEOUS | Status: DC
  Administered 2018-04-25: 22:00:00 32 [IU] via SUBCUTANEOUS
  Filled 2018-04-25 (×2): qty 0.32

## 2018-04-25 MED ORDER — INSULIN ASPART 100 UNIT/ML ~~LOC~~ SOLN
20.0000 [IU] | Freq: Once | SUBCUTANEOUS | Status: AC
  Administered 2018-04-25: 13:00:00 20 [IU] via SUBCUTANEOUS
  Filled 2018-04-25: qty 1

## 2018-04-25 NOTE — Progress Notes (Signed)
Physical Therapy Treatment Patient Details Name: Michael Wright MRN: [id number] DOB: [birth year] Today's Date: 04/25/2018    History of Present Illness This is a 77 year old male presented to the ED with worsening shortness of breath and AMS.  He  has a past medical history of CKD. Anemia, BPH, Cough, Dementia, Diabetes mellitus, type 2 ([place]), Gout, Hyperlipidemia, Hypertension, and Kidney stones. Dx of Acute CHF.    PT Comments    Pt agreeable to PT; denies pain. Pt demonstrating much progress in all functional mobility. Able to demonstrate stand exercises with and without AD; mild support given with HHA. Demonstrates ambulation with HHA and no AD; mild slowed pattern without AD and mild increased R/L sway, but no LOB and unsteadiness. Education with pt and daughter regarding use of AD (pt previously using cane) and demonstrates balance suitable for [facility] use. Pt initially demonstrates decreased O2 sats with stand activity and seated rests, but post education and improved pursed lip breathing/attentions to breathing with/without exercises pt maintains at 92% with and post activity. HR mildly elevated throughout with rest HR 110 bpm and mild increase to 118-120 with bouts of several minutes of stand activity at a time. Pt given seated rest to return near baseline. Pt educated and encouraged to improve stand to sit to use proper hand placement and controlled sit. Pt able to demonstrate with continued repetitions and focus. Discharge plan updated to HHPT to allow for safe transition home.   Follow Up Recommendations  Home health PT     Equipment Recommendations  None recommended by PT    Recommendations for Other Services       Precautions / Restrictions Precautions Precautions: Fall Restrictions Weight Bearing Restrictions: No    Mobility  Bed Mobility               General bed mobility comments: Sitting edge of bed  Transfers Overall transfer level: Needs  assistance Equipment used: Rolling walker (2 wheeled);None Transfers: Sit to/from Stand Sit to Stand: Min guard;Supervision         General transfer comment: Initial with rw, Min guard and cues for controlled sit and proper use of hands. Improved with repetition. Able to demonstrate control STS without AD  Ambulation/Gait Ambulation/Gait assistance: Supervision;Min guard Gait Distance (Feet): 80 Feet(twice) Assistive device: 1 person hand held assist;None Gait Pattern/deviations: Step-through pattern;Decreased stride length(Partial step through; mild R/L sway) Gait velocity: Controlled speed for safety Gait velocity interpretation: <1.8 ft/sec, indicate of risk for recurrent falls General Gait Details: No LOB with/without HHA. Mild sway and decreased stride. No LOB or unsteadiness noted.    Stairs             Wheelchair Mobility    Modified Rankin (Stroke Patients Only)       Balance   Sitting-balance support: Feet supported Sitting balance-Leahy Scale: Normal     Standing balance support: No upper extremity supported;During functional activity Standing balance-Leahy Scale: Good                              Cognition Arousal/Alertness: Awake/alert Behavior During Therapy: WFL for tasks assessed/performed Overall Cognitive Status: Within Functional Limits for tasks assessed                                        Exercises General Exercises - Lower Extremity Hip ABduction/ADduction: Strengthening;Both;10 reps;Standing(1  set with rw; 1 set HHA) Straight Leg Raises: Strengthening;Both;10 reps;Standing(1 set with rw; 1 set HHA) Hip Flexion/Marching: Strengthening;Both;20 reps;Standing(1 set rw; 1 set HHA) Mini-Sqauts: Strengthening;Both;10 reps;Standing(2 sets) Other Exercises Other Exercises: education regarding pursed lip deep breathing and controlling breathing with/without activity. Other Exercises: use of AD versus no AD     General Comments        Pertinent Vitals/Pain Pain Assessment: No/denies pain    Home Living                      Prior Function            PT Goals (current goals can now be found in the care plan section) Progress towards PT goals: Progressing toward goals    Frequency    Min 2X/week      PT Plan Discharge plan needs to be updated    Co-evaluation              AM-PAC PT "6 Clicks" Daily Activity  Outcome Measure  Difficulty turning over in bed (including adjusting bedclothes, sheets and blankets)?: None Difficulty moving from lying on back to sitting on the side of the bed? : None Difficulty sitting down on and standing up from a chair with arms (e.g., wheelchair, bedside commode, etc,.)?: A Little Help needed moving to and from a bed to chair (including a wheelchair)?: None Help needed walking in hospital room?: A Little Help needed climbing 3-5 steps with a railing? : A Little 6 Click Score: 21    End of Session Equipment Utilized During Treatment: Gait belt;Oxygen Activity Tolerance: Patient tolerated treatment well Patient left: Other (comment);with family/visitor present(sitting edge of bed per pt preference)   PT Visit Diagnosis: Unsteadiness on feet (R26.81);Repeated falls (R29.6);History of falling (Z91.81);Other symptoms and signs involving the nervous system (R29.898)     Time: 9417-4081 PT Time Calculation (min) (ACUTE ONLY): 39 min  Charges:  $Gait Training: 8-22 mins $Therapeutic Exercise: 23-37 mins                      Larae Grooms, PTA 04/25/2018, 11:44 AM

## 2018-04-25 NOTE — Care Management Important Message (Signed)
Important Message  Patient Details  Name: Michael Wright MRN: [id number] Date of Birth: 1941-06-01   Medicare Important Message Given:  Yes    Juliann Pulse A Tammatha Cobb 04/25/2018, 11:51 AM

## 2018-04-25 NOTE — Progress Notes (Signed)
[facility] [place] NAME: Michael Wright    MR#:  [id number]  DATE OF BIRTH:  26-Jan-1941  SUBJECTIVE:  CHIEF COMPLAINT:   Chief Complaint  Patient presents with  . Shortness of Breath   -Much improved today.  Remains on 3 L nasal cannula.  Renal function is stable  REVIEW OF SYSTEMS:  Review of Systems  Constitutional: Negative for chills and fever.  HENT: Negative for congestion, ear discharge, hearing loss and nosebleeds.   Eyes: Negative for blurred vision and double vision.  Respiratory: Positive for shortness of breath. Negative for cough and wheezing.   Cardiovascular: Negative for chest pain and palpitations.  Gastrointestinal: Negative for abdominal pain, constipation, diarrhea, nausea and vomiting.  Genitourinary: Negative for dysuria.  Musculoskeletal: Negative for myalgias.  Neurological: Negative for dizziness, focal weakness, seizures, weakness and headaches.  Psychiatric/Behavioral: Negative for depression.    DRUG ALLERGIES:   Allergies  Allergen Reactions  . Tramadol Other (See Comments)    Causes lightheadedness.    VITALS:  Blood pressure 121/71, pulse (!) 103, temperature 97.8 F (36.6 C), temperature source Oral, resp. rate 18, height 5\' 6"  (1.676 m), weight 104 kg, SpO2 92 %.  PHYSICAL EXAMINATION:  Physical Exam  GENERAL:  77 y.o.-year-old patient lying in the bed with no acute distress.  EYES: Pupils equal, round, reactive to light and accommodation. No scleral icterus. Extraocular muscles intact.  HEENT: Head atraumatic, normocephalic. Oropharynx and nasopharynx clear.  NECK:  Supple, no jugular venous distention. No thyroid enlargement, no tenderness.  LUNGS: Normal breath sounds bilaterally, no wheezing, rhonchi or crepitation.  Improved coarse breath sounds at the bases.  No use of accessory muscles of respiration. Decreased bibasilar breath sounds CARDIOVASCULAR: S1, S2 normal. No  rubs, or  gallops. 2/6 systolic murmur present ABDOMEN: Soft, nontender, nondistended. Bowel sounds present. No organomegaly or mass.  EXTREMITIES: No pedal edema, cyanosis, or clubbing.  NEUROLOGIC: Cranial nerves II through XII are intact. Muscle strength 5/5 in all extremities. Sensation intact. Gait not checked.  PSYCHIATRIC: The patient is alert and oriented x 3.  SKIN: No obvious rash, lesion, or ulcer.    LABORATORY PANEL:   CBC Recent Labs  Lab 04/24/18 0454  WBC 10.6  HGB 14.3  HCT 44.2  PLT 180   ------------------------------------------------------------------------------------------------------------------  Chemistries  Recent Labs  Lab 04/19/18 1456  04/25/18 0354  NA 137   < > 141  K 3.8   < > 3.7  CL 97*   < > 96*  CO2 29   < > 39*  GLUCOSE 453*   < > 263*  BUN 22   < > 26*  CREATININE 1.46*   < > 1.29*  CALCIUM 8.7*   < > 8.4*  AST 25  --   --   ALT 22  --   --   ALKPHOS 56  --   --   BILITOT 1.4*  --   --    < > = values in this interval not displayed.   ------------------------------------------------------------------------------------------------------------------  Cardiac Enzymes Recent Labs  Lab 04/20/18 0239  TROPONINI 0.10*   ------------------------------------------------------------------------------------------------------------------  RADIOLOGY:  Dg Chest 2 View  Result Date: 04/23/2018 CLINICAL DATA:  Shortness of breath. Clinical suspicion of congestive heart failure. EXAM: CHEST - 2 VIEW COMPARISON:  04/19/2018 FINDINGS: Cardiomegaly as seen previously. Interstitial densities are more prominent, worrisome for increased interstitial edema. Atelectasis at the lung bases is slightly worsened as well. Scattered granulomas as  seen previously. Minimal blunting of the posterior costophrenic angles. IMPRESSION: Worsened interstitial density suggesting worsening congestive heart failure. Worsening also of some basilar atelectasis. Electronically  Signed   By: Nelson Chimes M.D.   On: 04/23/2018 15:40    EKG:   Orders placed or performed during the hospital encounter of 04/19/18  . ED EKG  . ED EKG    ASSESSMENT AND PLAN:   77 year old male with past medical history significant for dementia, diabetes, hypertension, BPH, presents from home due to altered mental status and dyspnea  1.  Acute hypoxic respiratory failure-secondary to acute on chronic diastolic CHF exacerbation -Echocardiogram with EF of 55%.  Patient was diuresed with IV Lasix.  Lasix was held due to acute renal failure but restarted again. -However as his oxygen requirements were increasing with increased tachypnea and work of breathing.  Repeat chest x-ray showing pulmonary edema -Restarted IV Lasix. - CT of the chest on admission did not show any pulmonary embolism, has cardiomegaly and pulmonary congestion.  2. ARF on CKD stage 3- baseline cr at 1.4 Improving cr, now at 1. 2 -Lasix being restarted.  Off IV fluids. -No further worsening noted -Outpatient nephrology follow-up recommended  3.  Diabetes mellitus-continue Lantus and on sliding scale insulin -Increase the dose of Lantus as sugars still elevated -Pre-meal insulin added  4.  Dementia-on Aricept.  Alert and oriented, pleasantly confused  5.  DVT prophylaxis-Lovenox  PT recommended home health today. Daughter updated at bedside    All the records are reviewed and case discussed with Care Management/Social Workerr. Management plans discussed with the patient, family and they are in agreement.  CODE STATUS: Full Code  TOTAL TIME TAKING CARE OF THIS PATIENT: 36 minutes.   POSSIBLE D/C IN 1-2 DAYS, DEPENDING ON CLINICAL CONDITION.   Lihanna Biever M.D on 04/25/2018 at 2:00 PM  Between 7am to 6pm - Pager - [phone]  After 6pm go to [email or web address] - password [email or web address] [facility]  Office  [phone]  CC: Primary care physician; [place] Pink, MD

## 2018-04-25 NOTE — Progress Notes (Signed)
Pharmacy Electrolyte Monitoring Consult:  Pharmacy consulted to assist in monitoring and replacing electrolytes in this 77 y.o. male admitted on 04/19/2018 with Shortness of Breath   Labs:  Sodium (mmol/L)  Date Value  04/25/2018 141   Potassium (mmol/L)  Date Value  04/25/2018 3.7   Calcium (mg/dL)  Date Value  04/25/2018 8.4 (L)   Albumin (g/dL)  Date Value  04/19/2018 3.5    Assessment/Plan: Patient is ordered furosemide 40mg  IV BID. Will obtain BMP and Magnesium with am labs. Will replace as warranted.   Pharmacy will continue to monitor and adjust per consult.    Annai Heick L 04/25/2018 3:05 PM

## 2018-04-25 NOTE — Progress Notes (Signed)
Inpatient Diabetes Program Recommendations  AACE/ADA: New Consensus Statement on Inpatient Glycemic Control (2015)  Target Ranges:  Prepandial:   less than 140 mg/dL      Peak postprandial:   less than 180 mg/dL (1-2 hours)      Critically ill patients:  140 - 180 mg/dL   Results for Michael Wright, Michael Wright (MRN [id number]) as of 04/25/2018 08:01  Ref. Range 04/23/2018 07:35 04/23/2018 11:53 04/23/2018 16:35 04/23/2018 21:37  Glucose-Capillary Latest Ref Range: 70 - 99 mg/dL 245 (H)  5 units NOVOLOG +  2 mg Amaryl  244 (H)  5 units NOVOLOG  200 (H)  3 units NOVOLOG  204 (H)  2 units NOVOLOG +  22 units LANTUS   Results for Michael Wright, Michael Wright (MRN [id number]) as of 04/25/2018 08:01  Ref. Range 04/24/2018 08:17 04/24/2018 12:25 04/24/2018 17:06 04/24/2018 20:34  Glucose-Capillary Latest Ref Range: 70 - 99 mg/dL 223 (H)  5 units NOVOLOG +  2 mg Amaryl  249 (H)  5 units NOVOLOG  229 (H)  5 units NOVOLOG  254 (H)  3 units NOVOLOG +  27 units LANTUS   Results for Michael Wright, Michael Wright (MRN [id number]) as of 04/25/2018 08:01  Ref. Range 04/25/2018 03:54  Glucose Latest Ref Range: 70 - 99 mg/dL 263 (H)    Home DM Meds: Amaryl 2 mg BID                             Actos 15 mg daily                             Metformin 500 mg BID  Current Orders: Lantus 27 units QHS                            Novolog Moderate Correction Scale/ SSI (0-15 units) TID AC + HS      Amaryl 2 mg daily     MD- Please consider the following in-hospital insulin adjustments:  1. Increase Lantus to 30 units QHS  2. Start Novolog Meal Coverage: Novolog 4 units TID with meals   (Please add the following Hold Parameters: Hold if pt eats <50% of meal, Hold if pt NPO)     --Will follow patient during hospitalization--  Wyn Quaker RN, MSN, CDE Diabetes Coordinator Inpatient Glycemic Control Team Team Pager: [phone] (8a-5p)

## 2018-04-25 NOTE — Clinical Social Work Note (Signed)
MD notified CSW that patient should be ready for discharge tomorrow. CSW notified Otila Kluver at [employer]. Otila Kluver states that she will get [employer] authorization again since the previous authorization expired. CSW will continue to follow for discharge planning.   [place], Ashley

## 2018-04-26 LAB — MAGNESIUM: Magnesium: 1.7 mg/dL (ref 1.7–2.4)

## 2018-04-26 LAB — BASIC METABOLIC PANEL
ANION GAP: 11 (ref 5–15)
BUN: 34 mg/dL — AB (ref 8–23)
CHLORIDE: 94 mmol/L — AB (ref 98–111)
CO2: 35 mmol/L — AB (ref 22–32)
Calcium: 8.5 mg/dL — ABNORMAL LOW (ref 8.9–10.3)
Creatinine, Ser: 1.48 mg/dL — ABNORMAL HIGH (ref 0.61–1.24)
GFR calc Af Amer: 51 mL/min — ABNORMAL LOW (ref >60)
GFR calc non Af Amer: 44 mL/min — ABNORMAL LOW (ref >60)
GLUCOSE: 207 mg/dL — AB (ref 70–99)
Potassium: 3.3 mmol/L — ABNORMAL LOW (ref 3.5–5.1)
Sodium: 140 mmol/L (ref 135–145)

## 2018-04-26 LAB — GLUCOSE, CAPILLARY
Glucose-Capillary: 158 mg/dL — ABNORMAL HIGH (ref 70–99)
Glucose-Capillary: 261 mg/dL — ABNORMAL HIGH (ref 70–99)

## 2018-04-26 MED ORDER — CYCLOBENZAPRINE HCL 5 MG PO TABS: 5.0000 mg | ORAL_TABLET | Freq: Three times a day (TID) | ORAL | 0 refills | Status: DC | PRN

## 2018-04-26 MED ORDER — INSULIN GLARGINE 100 UNITS/ML SOLOSTAR PEN: 36.0000 [IU] | PEN_INJECTOR | Freq: Every day | SUBCUTANEOUS | 2 refills | Status: AC

## 2018-04-26 MED ORDER — POTASSIUM CHLORIDE CRYS ER 20 MEQ PO TBCR
40.0000 meq | EXTENDED_RELEASE_TABLET | Freq: Once | ORAL | Status: AC
  Administered 2018-04-26: 11:00:00 40 meq via ORAL
  Filled 2018-04-26: qty 2

## 2018-04-26 MED ORDER — FINASTERIDE 5 MG PO TABS: 5.0000 mg | ORAL_TABLET | Freq: Every day | ORAL | 2 refills | Status: DC

## 2018-04-26 MED ORDER — POTASSIUM CHLORIDE CRYS ER 20 MEQ PO TBCR: 20.0000 meq | EXTENDED_RELEASE_TABLET | Freq: Every day | ORAL | Status: DC

## 2018-04-26 MED ORDER — FUROSEMIDE 40 MG PO TABS: 40.0000 mg | ORAL_TABLET | Freq: Every day | ORAL | 3 refills | Status: DC

## 2018-04-26 MED ORDER — IPRATROPIUM-ALBUTEROL 0.5-2.5 (3) MG/3ML IN SOLN: 3.0000 mL | Freq: Four times a day (QID) | RESPIRATORY_TRACT | 1 refills | Status: DC | PRN

## 2018-04-26 MED ORDER — POTASSIUM CHLORIDE ER 20 MEQ PO TBCR: 20.0000 meq | EXTENDED_RELEASE_TABLET | Freq: Every day | ORAL | 2 refills | Status: AC

## 2018-04-26 MED ORDER — TRAZODONE HCL 50 MG PO TABS: 50.0000 mg | ORAL_TABLET | Freq: Every day | ORAL | 2 refills | Status: AC

## 2018-04-26 MED ORDER — FUROSEMIDE 40 MG PO TABS
40.0000 mg | ORAL_TABLET | Freq: Every day | ORAL | Status: DC
  Administered 2018-04-26: 40 mg via ORAL
  Filled 2018-04-26: qty 1

## 2018-04-26 MED ORDER — LIVING WELL WITH DIABETES BOOK
Freq: Once | Status: DC
  Filled 2018-04-26: qty 1

## 2018-04-26 MED ORDER — MAGNESIUM OXIDE 400 (241.3 MG) MG PO TABS
800.0000 mg | ORAL_TABLET | Freq: Two times a day (BID) | ORAL | Status: DC
  Administered 2018-04-26: 11:00:00 800 mg via ORAL
  Filled 2018-04-26: qty 2

## 2018-04-26 MED ORDER — PANTOPRAZOLE SODIUM 40 MG PO TBEC: 40.0000 mg | DELAYED_RELEASE_TABLET | Freq: Every day | ORAL | 2 refills | Status: AC

## 2018-04-26 MED ORDER — MAGNESIUM SULFATE 2 GM/50ML IV SOLN: 2.0000 g | Freq: Once | INTRAVENOUS | Status: DC

## 2018-04-26 NOTE — Progress Notes (Addendum)
SATURATION QUALIFICATIONS: (This note is used to comply with regulatory documentation for home oxygen)  Patient Saturations on Room Air at Rest = 98%  Patient Saturations on Room Air while Ambulating = 94%  Patient Saturations on 0 Liters of oxygen while Ambulating = 94%  Please briefly explain why patient needs home oxygen: 

## 2018-04-26 NOTE — Progress Notes (Signed)
Pharmacy Electrolyte Monitoring Consult:  Pharmacy consulted to assist in monitoring and replacing electrolytes in this 77 y.o. male admitted on 04/19/2018 with Shortness of Breath   Labs:  Sodium (mmol/L)  Date Value  04/26/2018 140   Potassium (mmol/L)  Date Value  04/26/2018 3.3 (L)   Magnesium (mg/dL)  Date Value  04/26/2018 1.7   Calcium (mg/dL)  Date Value  04/26/2018 8.5 (L)   Albumin (g/dL)  Date Value  04/19/2018 3.5    Assessment/Plan: 0827 K 3.3          Mg 1.7  Patient is ordered furosemide 40mg  IV BID. Ordered KCl 40 mEq PO x 1 and Mg sulfate 2 g IV x 1. Ordered BMP and Magnesium with am labs.   Pharmacy will continue to monitor and adjust per consult.     Paticia Stack, PharmD Pharmacy Resident  04/26/2018 7:11 AM

## 2018-04-26 NOTE — Progress Notes (Signed)
Physical Therapy Treatment Patient Details Name: Michael Wright MRN: [id number] DOB: 1941-04-25 Today's Date: 04/26/2018    History of Present Illness This is a 77 year old male presented to the ED with worsening shortness of breath and AMS.  He  has a past medical history of CKD. Anemia, BPH, Cough, Dementia, Diabetes mellitus, type 2 ([place]), Gout, Hyperlipidemia, Hypertension, and Kidney stones. Dx of Acute CHF.    PT Comments    Pt sitting edge of bed stating he hopes to go home today.  Stood and was able to walk around end of bed to walker with supervision.  Completed full lap around nursing unit with walker and min guard/supervision.  Overall does well but does display some decreased safety at times.  O2 sats at rest 93% on 2 lpm and 90% on 2 lpm after gait.  Discussed with primary MD.   Follow Up Recommendations  Home health PT     Equipment Recommendations  None recommended by PT    Recommendations for Other Services       Precautions / Restrictions Precautions Precautions: Fall Restrictions Weight Bearing Restrictions: No    Mobility  Bed Mobility               General bed mobility comments: Sitting edge of bed  Transfers Overall transfer level: Needs assistance Equipment used: Rolling walker (2 wheeled);None Transfers: Sit to/from Stand Sit to Stand: Min guard;Supervision         General transfer comment: stood without device and was able to walk without assist to/from walker at end of bed due to walker not fitting well around bed.  Ambulation/Gait Ambulation/Gait assistance: Supervision;Min guard Gait Distance (Feet): 180 Feet Assistive device: Rolling walker (2 wheeled) Gait Pattern/deviations: Step-through pattern;Decreased stride length   Gait velocity interpretation: 1.31 - 2.62 ft/sec, indicative of limited community ambulator General Gait Details: Did well with walker.  Initially "stiff" when standing.  Encouraged use of walker intially at  home for safety but overall does well.   Stairs             Wheelchair Mobility    Modified Rankin (Stroke Patients Only)       Balance Overall balance assessment: Needs assistance Sitting-balance support: Feet supported Sitting balance-Leahy Scale: Normal     Standing balance support: No upper extremity supported;During functional activity Standing balance-Leahy Scale: Good                              Cognition Arousal/Alertness: Awake/alert Behavior During Therapy: WFL for tasks assessed/performed Overall Cognitive Status: Within Functional Limits for tasks assessed                                        Exercises      General Comments        Pertinent Vitals/Pain Pain Assessment: No/denies pain    Home Living                      Prior Function            PT Goals (current goals can now be found in the care plan section) Progress towards PT goals: Progressing toward goals    Frequency    Min 2X/week      PT Plan Current plan remains appropriate    Co-evaluation  AM-PAC PT "6 Clicks" Daily Activity  Outcome Measure  Difficulty turning over in bed (including adjusting bedclothes, sheets and blankets)?: None Difficulty moving from lying on back to sitting on the side of the bed? : None Difficulty sitting down on and standing up from a chair with arms (e.g., wheelchair, bedside commode, etc,.)?: None Help needed moving to and from a bed to chair (including a wheelchair)?: None Help needed walking in hospital room?: A Little Help needed climbing 3-5 steps with a railing? : A Little 6 Click Score: 22    End of Session Equipment Utilized During Treatment: Gait belt;Oxygen Activity Tolerance: Patient tolerated treatment well Patient left: in chair;with chair alarm set         Time: 8341-9622 PT Time Calculation (min) (ACUTE ONLY): 17 min  Charges:  $Gait Training: 8-22 mins                      Chesley Noon, PTA 04/26/18, 9:26 AM

## 2018-04-26 NOTE — Progress Notes (Signed)
Inpatient Diabetes Program Recommendations  AACE/ADA: New Consensus Statement on Inpatient Glycemic Control (2019)  Target Ranges:  Prepandial:   less than 140 mg/dL      Peak postprandial:   less than 180 mg/dL (1-2 hours)      Critically ill patients:  140 - 180 mg/dL  Results for Michael Wright, Michael Wright (MRN [id number]) as of 04/26/2018 13:06  Ref. Range 04/25/2018 08:02 04/25/2018 12:46 04/25/2018 16:45 04/25/2018 21:19 04/26/2018 07:57 04/26/2018 11:32  Glucose-Capillary Latest Ref Range: 70 - 99 mg/dL 185 (H) 552 (HH) 203 (H) 218 (H) 158 (H) 261 (H)    Review of Glycemic Control  Diabetes history: DM2 Outpatient Diabetes medications: Amaryl 2 mg BID, Metformin 500 mg BID, Actos 15 mg daily Current orders for Inpatient glycemic control: Lantus 32 units QHS, Novolog 4 units TID with meals, Amaryl 2 mg QAM, Novolog 0-15 units TID with meals, Novolog 0-5 units QHS  Inpatient Diabetes Program Recommendations:  HgbA1C: A1C 10.7% on 04/19/18 indicating an average glucose of 260 mg/dl. Per office visit note by Dr. Kary Kos on 12/06/17, prior A1C 7.8% on 08/03/2017.  NOTE: Received consult for insulin teaching. Spoke with patient regarding DM control and plan to discharge on insulin. Patient reports that he does not check glucose at home but reports that he has everything at home for glucose monitoring. Patient reports that he has given his wife insulin injections for the past 5 years (she passed away in 02-Feb-2023 of this year) with vial/syringe and insulin pens. Explained that MD would like him to take Lantus at home. Patient states that his wife took Lantus so he is familiar with the medication. Reviewed how Lantus works. Had patient demonstrate insulin pen use which he done well but skipped 2 unit prime. Reviewed and demonstrated proper technique of insulin pen use and patient was able to successfully demonstrate. Patient was not able to verbalize what goal glucose should be. Reviewed glucose and A1C goals.  Discussed importance of glycemic control to decrease risk of complications from uncontrolled DM. Asked patient to be sure to check glucose at least 2 times per day and to keep a record which he needs to take to follow up appointments with PCP.  Discussed FreeStyle Libre and encouraged patient to discuss with PCP if he is interested in using it which would provide more information on glycemic control and help MD with DM medication adjustments.  Reviewed hypoglycemia as well along with treatment.  Informed patient Living Well with DM book would be ordered. Encouraged patient to read entire book to gain more knowledge. Patient's daughter came in during discussion with patient and reviewed insulin pen with her as well in case she needed to help her father with insulin administration at some point. Patient and his daughter verbalized understanding of information discussed and they stated they have no further questions at this time related to DM or insulin.  Thanks, Barnie Alderman, RN, MSN, CDE Diabetes Coordinator Inpatient Diabetes Program [phone] (Team Pager from 8am to 5pm)

## 2018-04-26 NOTE — Discharge Summary (Addendum)
[facility] NAME: Michael Wright    MR#:  [id number]  DATE OF BIRTH:  1941/06/01  DATE OF ADMISSION:  04/19/2018   ADMITTING PHYSICIAN: Loletha Grayer, MD  DATE OF DISCHARGE:  04/26/18  PRIMARY CARE PHYSICIAN: Maryland Pink, MD   ADMISSION DIAGNOSIS:   Acute encephalopathy [G93.40] Acute respiratory failure with hypoxia and hypercapnia (HCC) [J96.01, J96.02]  DISCHARGE DIAGNOSIS:   Active Problems:   Acute respiratory failure with hypoxia and hypercapnia (HCC)   Acute pulmonary edema (HCC)   SECONDARY DIAGNOSIS:   Past Medical History:  Diagnosis Date  . Anemia   . BPH (benign prostatic hyperplasia)   . Cough   . Dementia   . Diabetes mellitus, type 2 ([place])   . Gout   . Hyperlipidemia   . Hypertension   . Kidney stones     HOSPITAL COURSE:   77 year old male with past medical history significant for dementia, diabetes, hypertension, BPH, presents from home due to altered mental status and dyspnea  1.  Acute hypoxic respiratory failure-secondary to acute on chronic diastolic CHF exacerbation -Echocardiogram with EF of 55%.  Patient was diuresed with IV Lasix.   -Lasix held for a few days due to acute renal failure however resume due to worsening CHF. -Remains on 2 L oxygen for his CHF.  As repeat chest x-ray confirmed pulmonary edema -Diuresed again with IV Lasix and now transition to oral Lasix at discharge.  Strongly counseled about low-sodium diet.. - CT of the chest on admission did not show any pulmonary embolism, has cardiomegaly and pulmonary congestion.  2. ARF on CKD stage 3- baseline cr at 1.4 Improving cr, now at 1. 4 -Continue low-dose Lasix. -Outpatient nephrology follow-up recommended  3.  Diabetes mellitus-discontinue glimepiride and metformin at discharge. -Will be started on insulin Solostar pen at 36 units for now. -Appreciate diabetes coordinator input continue Lantus and on  sliding scale insulin -A1c added on to the morning blood work and pending  4.  Dementia-on Aricept.  Alert and oriented, impulsive which could be his personality  PT recommended home health. Will be discharged home today  DISCHARGE CONDITIONS:   Guarded  CONSULTS OBTAINED:   Treatment Team:  Yolonda Kida, MD  DRUG ALLERGIES:   Allergies  Allergen Reactions  . Tramadol Other (See Comments)    Causes lightheadedness.   DISCHARGE MEDICATIONS:   Allergies as of 04/26/2018      Reactions   Tramadol Other (See Comments)   Causes lightheadedness.      Medication List    STOP taking these medications   gemfibrozil 600 MG tablet Commonly known as:  LOPID   glimepiride 2 MG tablet Commonly known as:  AMARYL   losartan-hydrochlorothiazide 100-25 MG tablet Commonly known as:  HYZAAR   metFORMIN 500 MG tablet Commonly known as:  GLUCOPHAGE   metFORMIN 850 MG tablet Commonly known as:  GLUCOPHAGE     TAKE these medications   allopurinol 100 MG tablet Commonly known as:  ZYLOPRIM Take 100 mg by mouth 2 (two) times daily.   aspirin EC 81 MG tablet Take 81 mg by mouth daily. What changed:  Another medication with the same name was removed. Continue taking this medication, and follow the directions you see here.   colchicine 0.6 MG tablet Take 0.6 mg by mouth 2 (two) times daily as needed (for gout flares).   Cyanocobalamin 3000 MCG Subl Place 3,000 mcg under the tongue daily at  12 noon.   cyclobenzaprine 5 MG tablet Commonly known as:  FLEXERIL Take 1 tablet (5 mg total) by mouth 3 (three) times daily as needed for muscle spasms (aches).   donepezil 10 MG tablet Commonly known as:  ARICEPT Take 10 mg by mouth daily.   finasteride 5 MG tablet Commonly known as:  PROSCAR Take 1 tablet (5 mg total) by mouth daily.   furosemide 40 MG tablet Commonly known as:  LASIX Take 1 tablet (40 mg total) by mouth daily.   insulin glargine 100 unit/mL  Sopn Commonly known as:  LANTUS Inject 0.36 mLs (36 Units total) into the skin at bedtime.   ipratropium-albuterol 0.5-2.5 (3) MG/3ML Soln Commonly known as:  DUONEB Take 3 mLs by nebulization every 6 (six) hours as needed (For wheezing and shortness of breath).   loratadine 10 MG tablet Commonly known as:  CLARITIN Take 10 mg by mouth daily.   pantoprazole 40 MG tablet Commonly known as:  PROTONIX Take 1 tablet (40 mg total) by mouth daily.   Potassium Chloride ER 20 MEQ Tbcr Take 20 mEq by mouth daily. While on lasix   pravastatin 40 MG tablet Commonly known as:  PRAVACHOL Take 40 mg by mouth at bedtime.   traZODone 50 MG tablet Commonly known as:  DESYREL Take 1 tablet (50 mg total) by mouth at bedtime.            Durable Medical Equipment  (From admission, onward)         Start     Ordered   04/26/18 0854  For home use only DME Nebulizer machine  Once    Question:  Patient needs a nebulizer to treat with the following condition  Answer:  COPD (chronic obstructive pulmonary disease) ([place])   04/26/18 7829   04/26/18 0854  For home use only DME Walker rolling  Once    Question:  Patient needs a walker to treat with the following condition  Answer:  Ataxia   04/26/18 0853   04/26/18 0854  For home use only DME oxygen  Once    Question Answer Comment  Mode or (Route) Nasal cannula   Liters per Minute 2   Frequency Continuous (stationary and portable oxygen unit needed)   Oxygen conserving device Yes   Oxygen delivery system Gas      04/26/18 0853           DISCHARGE INSTRUCTIONS:   1. PCP f/u in 1-2 weeks 2.  Nephrology follow-up in 1 to 2 weeks  DIET:   Cardiac diet  ACTIVITY:   Activity as tolerated  OXYGEN:   Home Oxygen: Yes.    Oxygen Delivery: 2 liters/min via Patient connected to nasal cannula oxygen  DISCHARGE LOCATION:   home   If you experience worsening of your admission symptoms, develop shortness of breath, life threatening  emergency, suicidal or homicidal thoughts you must seek medical attention immediately by calling 911 or calling your MD immediately  if symptoms less severe.  You Must read complete instructions/literature along with all the possible adverse reactions/side effects for all the Medicines you take and that have been prescribed to you. Take any new Medicines after you have completely understood and accpet all the possible adverse reactions/side effects.   Please note  You were cared for by a hospitalist during your hospital stay. If you have any questions about your discharge medications or the care you received while you were in the hospital after you are discharged, you  can call the unit and asked to speak with the hospitalist on call if the hospitalist that took care of you is not available. Once you are discharged, your primary care physician will handle any further medical issues. Please note that NO REFILLS for any discharge medications will be authorized once you are discharged, as it is imperative that you return to your primary care physician (or establish a relationship with a primary care physician if you do not have one) for your aftercare needs so that they can reassess your need for medications and monitor your lab values.    On the day of Discharge:  VITAL SIGNS:   Blood pressure 117/68, pulse 90, temperature 97.9 F (36.6 C), temperature source Oral, resp. rate 20, height 5\' 6"  (1.676 m), weight 104 kg, SpO2 95 %.  PHYSICAL EXAMINATION:    GENERAL:  77 y.o.-year-old patient lying in the bed with no acute distress.  EYES: Pupils equal, round, reactive to light and accommodation. No scleral icterus. Extraocular muscles intact.  HEENT: Head atraumatic, normocephalic. Oropharynx and nasopharynx clear.  NECK:  Supple, no jugular venous distention. No thyroid enlargement, no tenderness.  LUNGS: Normal breath sounds bilaterally, no wheezing, rhonchi or crepitation.  Improved coarse breath  sounds at the bases.  No use of accessory muscles of respiration. Decreased bibasilar breath sounds CARDIOVASCULAR: S1, S2 normal. No  rubs, or gallops. 2/6 systolic murmur present ABDOMEN: Soft, nontender, nondistended. Bowel sounds present. No organomegaly or mass.  EXTREMITIES: No pedal edema, cyanosis, or clubbing.  NEUROLOGIC: Cranial nerves II through XII are intact. Muscle strength 5/5 in all extremities. Sensation intact. Gait not checked.  PSYCHIATRIC: The patient is alert and oriented x 3.  SKIN: No obvious rash, lesion, or ulcer.   DATA REVIEW:   CBC Recent Labs  Lab 04/24/18 0454  WBC 10.6  HGB 14.3  HCT 44.2  PLT 180    Chemistries  Recent Labs  Lab 04/19/18 1456  04/26/18 0334  NA 137   < > 140  K 3.8   < > 3.3*  CL 97*   < > 94*  CO2 29   < > 35*  GLUCOSE 453*   < > 207*  BUN 22   < > 34*  CREATININE 1.46*   < > 1.48*  CALCIUM 8.7*   < > 8.5*  MG  --   --  1.7  AST 25  --   --   ALT 22  --   --   ALKPHOS 56  --   --   BILITOT 1.4*  --   --    < > = values in this interval not displayed.     Microbiology Results  Results for orders placed or performed during the hospital encounter of 04/19/18  MRSA PCR Screening     Status: None   Collection Time: 04/19/18  9:11 PM  Result Value Ref Range Status   MRSA by PCR NEGATIVE NEGATIVE Final    Comment:        The GeneXpert MRSA Assay (FDA approved for NASAL specimens only), is one component of a comprehensive MRSA colonization surveillance program. It is not intended to diagnose MRSA infection nor to guide or monitor treatment for MRSA infections. Performed at [facility], [address], [place], [place] [address]     RADIOLOGY:  No results found.   Management plans discussed with the patient, family and they are in agreement.  CODE STATUS:  Code Status History  Date Active Date Inactive Code Status Order ID Comments User Context   05/15/2015 2350 05/16/2015 1949 Full Code  [id number]  Lance Coon, MD ED      TOTAL TIME TAKING CARE OF THIS PATIENT: 38 minutes.    Gladstone Lighter M.D on 04/26/2018 at 8:55 AM  Between 7am to 6pm - Pager - [phone]  After 6pm go to [email or web address] - Proofreader  [facility]  Office  [phone]  CC: Primary care physician; Maryland Pink, MD   Note: This dictation was prepared with Dragon dictation along with smaller phrase technology. Any transcriptional errors that result from this process are unintentional.

## 2018-04-26 NOTE — Care Management Note (Signed)
Case Management Note  Patient Details  Name: Michael Wright MRN: [id number] Date of Birth: [birth year]  Subjective/Objective: Spoke with CSW who spoke with daughter regarding home health services. Daughter had no preference for home health agency. She just didn't want [facility]. RNCM referred [facility] to [facility] for RN, PT, HHA and SW. Also ordered patient a nebulizer, and walker from [facility]. Patient has home O2 @ [facility]. He did not qualify for O2 ATC. Primary nurse reports he did well off O2 with ambulation on RA.  RNCM attempted to reach daughter multiple times without success.                  Action/Plan:   Expected Discharge Date:  04/26/18               Expected Discharge Plan:  [place]  In-House Referral:     Discharge planning Services  CM Consult  Post Acute Care Choice:  Durable Medical Equipment, Home Health Choice offered to:  Adult Children  DME Arranged:  Nebulizer machine, Walker rolling DME Agency:  [place] Arranged:  RN, PT, Nurse's Aide, Social Work [employer] Agency:  [place]  Status of Service:  Completed, signed off  If discussed at [employer], dates discussed:    Additional Comments:  Jolly Mango, RN 04/26/2018, 2:22 PM

## 2018-04-26 NOTE — Progress Notes (Signed)
Patient being discharged to home with home health. Daughter Adonis Huguenin was present this morning when patient was notified of discharge to home. Daughter was in agreement with that.

## 2018-04-26 NOTE — Clinical Social Work Note (Signed)
Patient's PT recommendation has changed from SNF to home with home health. CSW notified patient's daughter Lenord Fellers at bedside. Daughter states that she would like patient to return home with home health. CSW notified RN CM of change in disposition. CSW also notified Otila Kluver at [employer] of change. CSW signing off. Please reconsult if further needs arise.   [place], [place]

## 2018-05-03 DIAGNOSIS — N183 Chronic kidney disease, stage 3 (moderate): Secondary | ICD-10-CM | POA: Diagnosis not present

## 2018-05-03 DIAGNOSIS — I5030 Unspecified diastolic (congestive) heart failure: Secondary | ICD-10-CM | POA: Diagnosis not present

## 2018-05-03 DIAGNOSIS — F0390 Unspecified dementia without behavioral disturbance: Secondary | ICD-10-CM | POA: Diagnosis not present

## 2018-05-03 DIAGNOSIS — E1165 Type 2 diabetes mellitus with hyperglycemia: Secondary | ICD-10-CM | POA: Diagnosis not present

## 2018-05-11 DIAGNOSIS — N179 Acute kidney failure, unspecified: Secondary | ICD-10-CM | POA: Diagnosis not present

## 2018-05-11 DIAGNOSIS — E119 Type 2 diabetes mellitus without complications: Secondary | ICD-10-CM | POA: Diagnosis not present

## 2018-05-11 DIAGNOSIS — I5033 Acute on chronic diastolic (congestive) heart failure: Secondary | ICD-10-CM | POA: Diagnosis not present

## 2018-05-11 DIAGNOSIS — I10 Essential (primary) hypertension: Secondary | ICD-10-CM | POA: Diagnosis not present

## 2018-05-11 DIAGNOSIS — E785 Hyperlipidemia, unspecified: Secondary | ICD-10-CM | POA: Diagnosis not present

## 2018-05-24 DIAGNOSIS — E1122 Type 2 diabetes mellitus with diabetic chronic kidney disease: Secondary | ICD-10-CM | POA: Diagnosis not present

## 2018-05-24 DIAGNOSIS — E1165 Type 2 diabetes mellitus with hyperglycemia: Secondary | ICD-10-CM | POA: Diagnosis not present

## 2018-05-24 DIAGNOSIS — N183 Chronic kidney disease, stage 3 (moderate): Secondary | ICD-10-CM | POA: Diagnosis not present

## 2018-05-24 DIAGNOSIS — I129 Hypertensive chronic kidney disease with stage 1 through stage 4 chronic kidney disease, or unspecified chronic kidney disease: Secondary | ICD-10-CM | POA: Diagnosis not present

## 2018-05-24 DIAGNOSIS — E1169 Type 2 diabetes mellitus with other specified complication: Secondary | ICD-10-CM | POA: Diagnosis not present

## 2018-05-24 DIAGNOSIS — E538 Deficiency of other specified B group vitamins: Secondary | ICD-10-CM | POA: Diagnosis not present

## 2018-05-24 DIAGNOSIS — E785 Hyperlipidemia, unspecified: Secondary | ICD-10-CM | POA: Diagnosis not present

## 2018-05-26 DIAGNOSIS — E1122 Type 2 diabetes mellitus with diabetic chronic kidney disease: Secondary | ICD-10-CM | POA: Diagnosis not present

## 2018-05-26 DIAGNOSIS — N183 Chronic kidney disease, stage 3 (moderate): Secondary | ICD-10-CM | POA: Diagnosis not present

## 2018-05-26 DIAGNOSIS — R809 Proteinuria, unspecified: Secondary | ICD-10-CM | POA: Diagnosis not present

## 2018-05-26 DIAGNOSIS — I10 Essential (primary) hypertension: Secondary | ICD-10-CM | POA: Diagnosis not present

## 2018-05-27 DIAGNOSIS — J440 Chronic obstructive pulmonary disease with acute lower respiratory infection: Secondary | ICD-10-CM | POA: Diagnosis not present

## 2018-05-27 DIAGNOSIS — J45909 Unspecified asthma, uncomplicated: Secondary | ICD-10-CM | POA: Diagnosis not present

## 2018-05-27 DIAGNOSIS — R0689 Other abnormalities of breathing: Secondary | ICD-10-CM | POA: Diagnosis not present

## 2018-06-01 ENCOUNTER — Other Ambulatory Visit: Payer: Self-pay | Admitting: Nephrology

## 2018-06-01 DIAGNOSIS — N183 Chronic kidney disease, stage 3 unspecified: Secondary | ICD-10-CM

## 2018-06-06 DIAGNOSIS — E1165 Type 2 diabetes mellitus with hyperglycemia: Secondary | ICD-10-CM | POA: Diagnosis not present

## 2018-06-06 DIAGNOSIS — I10 Essential (primary) hypertension: Secondary | ICD-10-CM | POA: Diagnosis not present

## 2018-06-06 DIAGNOSIS — N183 Chronic kidney disease, stage 3 (moderate): Secondary | ICD-10-CM | POA: Diagnosis not present

## 2018-06-06 DIAGNOSIS — E7800 Pure hypercholesterolemia, unspecified: Secondary | ICD-10-CM | POA: Diagnosis not present

## 2018-06-06 DIAGNOSIS — I5032 Chronic diastolic (congestive) heart failure: Secondary | ICD-10-CM | POA: Diagnosis not present

## 2018-06-07 ENCOUNTER — Ambulatory Visit
Admission: RE | Admit: 2018-06-07 | Discharge: 2018-06-07 | Disposition: A | Discharge status: Home / Self Care | Payer: Medicare HMO | Source: Ambulatory Visit | Attending: Nephrology | Admitting: Nephrology

## 2018-06-07 DIAGNOSIS — N400 Enlarged prostate without lower urinary tract symptoms: Secondary | ICD-10-CM | POA: Diagnosis not present

## 2018-06-07 DIAGNOSIS — N183 Chronic kidney disease, stage 3 unspecified: Secondary | ICD-10-CM

## 2018-06-17 DIAGNOSIS — E1122 Type 2 diabetes mellitus with diabetic chronic kidney disease: Secondary | ICD-10-CM | POA: Diagnosis not present

## 2018-06-17 DIAGNOSIS — N183 Chronic kidney disease, stage 3 (moderate): Secondary | ICD-10-CM | POA: Diagnosis not present

## 2018-06-17 DIAGNOSIS — I10 Essential (primary) hypertension: Secondary | ICD-10-CM | POA: Diagnosis not present

## 2018-06-17 DIAGNOSIS — R809 Proteinuria, unspecified: Secondary | ICD-10-CM | POA: Diagnosis not present

## 2018-06-20 ENCOUNTER — Encounter: Payer: Self-pay | Admitting: *Deleted

## 2018-06-20 ENCOUNTER — Other Ambulatory Visit: Payer: Self-pay

## 2018-06-20 DIAGNOSIS — H2511 Age-related nuclear cataract, right eye: Secondary | ICD-10-CM | POA: Diagnosis not present

## 2018-06-21 ENCOUNTER — Encounter: Payer: Self-pay | Admitting: Anesthesiology

## 2018-06-22 NOTE — Discharge Instructions (Signed)

## 2018-06-26 DIAGNOSIS — J45909 Unspecified asthma, uncomplicated: Secondary | ICD-10-CM | POA: Diagnosis not present

## 2018-06-26 DIAGNOSIS — J440 Chronic obstructive pulmonary disease with acute lower respiratory infection: Secondary | ICD-10-CM | POA: Diagnosis not present

## 2018-06-26 DIAGNOSIS — R0689 Other abnormalities of breathing: Secondary | ICD-10-CM | POA: Diagnosis not present

## 2018-06-27 ENCOUNTER — Ambulatory Visit
Admission: RE | Admit: 2018-06-27 | Payer: Commercial Managed Care - HMO | Source: Ambulatory Visit | Admitting: Ophthalmology

## 2018-06-27 HISTORY — DX: Unspecified osteoarthritis, unspecified site: M19.90

## 2018-06-27 HISTORY — DX: Chronic diastolic (congestive) heart failure: I50.32

## 2018-06-27 HISTORY — DX: Gastro-esophageal reflux disease without esophagitis: K21.9

## 2018-06-27 HISTORY — DX: Chronic kidney disease, stage 3 unspecified: N18.30

## 2018-06-27 HISTORY — DX: Chronic kidney disease, stage 3 (moderate): N18.3

## 2018-06-27 HISTORY — DX: Transient cerebral ischemic attack, unspecified: G45.9

## 2018-06-27 HISTORY — DX: Polyneuropathy, unspecified: G62.9

## 2018-06-27 SURGERY — PHACOEMULSIFICATION, CATARACT, WITH IOL INSERTION
Anesthesia: Topical | Laterality: Right

## 2018-06-29 ENCOUNTER — Other Ambulatory Visit
Admission: RE | Admit: 2018-06-29 | Discharge: 2018-06-29 | Disposition: A | Discharge status: Home / Self Care | Payer: Medicare HMO | Source: Ambulatory Visit | Attending: Family Medicine | Admitting: Family Medicine

## 2018-06-29 DIAGNOSIS — Z794 Long term (current) use of insulin: Secondary | ICD-10-CM | POA: Diagnosis not present

## 2018-06-29 DIAGNOSIS — R05 Cough: Secondary | ICD-10-CM | POA: Diagnosis not present

## 2018-06-29 DIAGNOSIS — E119 Type 2 diabetes mellitus without complications: Secondary | ICD-10-CM | POA: Diagnosis not present

## 2018-06-29 DIAGNOSIS — R0602 Shortness of breath: Secondary | ICD-10-CM | POA: Insufficient documentation

## 2018-06-29 DIAGNOSIS — I5033 Acute on chronic diastolic (congestive) heart failure: Secondary | ICD-10-CM | POA: Diagnosis not present

## 2018-06-29 DIAGNOSIS — N189 Chronic kidney disease, unspecified: Secondary | ICD-10-CM | POA: Diagnosis not present

## 2018-06-29 LAB — BRAIN NATRIURETIC PEPTIDE: B Natriuretic Peptide: 46 pg/mL (ref 0.0–100.0)

## 2018-07-14 ENCOUNTER — Ambulatory Visit: Payer: Self-pay | Admitting: Urology

## 2018-07-19 DIAGNOSIS — E1165 Type 2 diabetes mellitus with hyperglycemia: Secondary | ICD-10-CM | POA: Diagnosis not present

## 2018-07-19 DIAGNOSIS — E785 Hyperlipidemia, unspecified: Secondary | ICD-10-CM | POA: Diagnosis not present

## 2018-07-19 DIAGNOSIS — E1122 Type 2 diabetes mellitus with diabetic chronic kidney disease: Secondary | ICD-10-CM | POA: Diagnosis not present

## 2018-07-19 DIAGNOSIS — E1169 Type 2 diabetes mellitus with other specified complication: Secondary | ICD-10-CM | POA: Diagnosis not present

## 2018-07-19 DIAGNOSIS — I129 Hypertensive chronic kidney disease with stage 1 through stage 4 chronic kidney disease, or unspecified chronic kidney disease: Secondary | ICD-10-CM | POA: Diagnosis not present

## 2018-07-19 DIAGNOSIS — N183 Chronic kidney disease, stage 3 (moderate): Secondary | ICD-10-CM | POA: Diagnosis not present

## 2018-07-21 ENCOUNTER — Emergency Department
Admission: EM | Admit: 2018-07-21 | Discharge: 2018-07-21 | Disposition: A | Discharge status: Home / Self Care | Payer: Medicare HMO | Attending: Emergency Medicine | Admitting: Emergency Medicine

## 2018-07-21 ENCOUNTER — Emergency Department: Payer: Medicare HMO

## 2018-07-21 ENCOUNTER — Other Ambulatory Visit: Payer: Self-pay

## 2018-07-21 ENCOUNTER — Encounter: Payer: Self-pay | Admitting: Emergency Medicine

## 2018-07-21 DIAGNOSIS — R0600 Dyspnea, unspecified: Secondary | ICD-10-CM | POA: Diagnosis not present

## 2018-07-21 DIAGNOSIS — Z79899 Other long term (current) drug therapy: Secondary | ICD-10-CM | POA: Diagnosis not present

## 2018-07-21 DIAGNOSIS — N183 Chronic kidney disease, stage 3 (moderate): Secondary | ICD-10-CM | POA: Diagnosis not present

## 2018-07-21 DIAGNOSIS — J45909 Unspecified asthma, uncomplicated: Secondary | ICD-10-CM | POA: Insufficient documentation

## 2018-07-21 DIAGNOSIS — I10 Essential (primary) hypertension: Secondary | ICD-10-CM | POA: Diagnosis not present

## 2018-07-21 DIAGNOSIS — Z7982 Long term (current) use of aspirin: Secondary | ICD-10-CM | POA: Diagnosis not present

## 2018-07-21 DIAGNOSIS — R0602 Shortness of breath: Secondary | ICD-10-CM | POA: Insufficient documentation

## 2018-07-21 DIAGNOSIS — F0390 Unspecified dementia without behavioral disturbance: Secondary | ICD-10-CM | POA: Diagnosis not present

## 2018-07-21 DIAGNOSIS — Z794 Long term (current) use of insulin: Secondary | ICD-10-CM | POA: Diagnosis not present

## 2018-07-21 DIAGNOSIS — J9811 Atelectasis: Secondary | ICD-10-CM | POA: Diagnosis not present

## 2018-07-21 DIAGNOSIS — I5030 Unspecified diastolic (congestive) heart failure: Secondary | ICD-10-CM | POA: Diagnosis not present

## 2018-07-21 DIAGNOSIS — E1122 Type 2 diabetes mellitus with diabetic chronic kidney disease: Secondary | ICD-10-CM | POA: Insufficient documentation

## 2018-07-21 DIAGNOSIS — I130 Hypertensive heart and chronic kidney disease with heart failure and stage 1 through stage 4 chronic kidney disease, or unspecified chronic kidney disease: Secondary | ICD-10-CM | POA: Insufficient documentation

## 2018-07-21 DIAGNOSIS — I700 Atherosclerosis of aorta: Secondary | ICD-10-CM | POA: Diagnosis not present

## 2018-07-21 LAB — BASIC METABOLIC PANEL WITH GFR
Anion gap: 3 — ABNORMAL LOW (ref 5–15)
BUN: 14 mg/dL (ref 8–23)
CO2: 40 mmol/L — ABNORMAL HIGH (ref 22–32)
Calcium: 8.6 mg/dL — ABNORMAL LOW (ref 8.9–10.3)
Chloride: 100 mmol/L (ref 98–111)
Creatinine, Ser: 1.09 mg/dL (ref 0.61–1.24)
GFR calc Af Amer: >60 mL/min (ref >60)
GFR calc non Af Amer: >60 mL/min (ref >60)
Glucose, Bld: 184 mg/dL — ABNORMAL HIGH (ref 70–99)
Potassium: 3.5 mmol/L (ref 3.5–5.1)
Sodium: 143 mmol/L (ref 135–145)

## 2018-07-21 LAB — CBC
HCT: 46 % (ref 39.0–52.0)
Hemoglobin: 13.9 g/dL (ref 13.0–17.0)
MCH: 28.5 pg (ref 26.0–34.0)
MCHC: 30.2 g/dL (ref 30.0–36.0)
MCV: 94.5 fL (ref 80.0–100.0)
Platelets: 257 K/uL (ref 150–400)
RBC: 4.87 MIL/uL (ref 4.22–5.81)
RDW: 12.7 % (ref 11.5–15.5)
WBC: 14.6 K/uL — ABNORMAL HIGH (ref 4.0–10.5)
nRBC: 0 % (ref 0.0–0.2)

## 2018-07-21 LAB — BLOOD GAS, ARTERIAL
ACID-BASE EXCESS: 10.4 mmol/L — AB (ref 0.0–2.0)
BICARBONATE: 37.4 mmol/L — AB (ref 20.0–28.0)
FIO2: 0.32
O2 Saturation: 98.6 %
PCO2 ART: 59 mmHg — AB (ref 32.0–48.0)
PH ART: 7.41 (ref 7.350–7.450)
Patient temperature: 37
pO2, Arterial: 99 mmHg (ref 83.0–108.0)

## 2018-07-21 LAB — TROPONIN I: Troponin I: 0.03 ng/mL (ref <0.03)

## 2018-07-21 MED ORDER — DOXYCYCLINE HYCLATE 100 MG PO TABS
100.0000 mg | ORAL_TABLET | Freq: Once | ORAL | Status: AC
  Administered 2018-07-21: 100 mg via ORAL
  Filled 2018-07-21: qty 1

## 2018-07-21 MED ORDER — HYDROCODONE-HOMATROPINE 5-1.5 MG/5ML PO SYRP: 3.0000 mL | ORAL_SOLUTION | Freq: Four times a day (QID) | ORAL | 0 refills | Status: DC | PRN

## 2018-07-21 MED ORDER — PREDNISONE 20 MG PO TABS: 20.0000 mg | ORAL_TABLET | Freq: Two times a day (BID) | ORAL | 0 refills | Status: DC

## 2018-07-21 MED ORDER — IOHEXOL 350 MG/ML SOLN
75.0000 mL | Freq: Once | INTRAVENOUS | Status: AC | PRN
  Administered 2018-07-21: 75 mL via INTRAVENOUS

## 2018-07-21 MED ORDER — DOXYCYCLINE HYCLATE 100 MG PO CAPS: 100.0000 mg | ORAL_CAPSULE | Freq: Two times a day (BID) | ORAL | 0 refills | Status: DC

## 2018-07-21 NOTE — ED Notes (Signed)
Pt A&Ox4 but responding slower than usual to questions per family.

## 2018-07-21 NOTE — ED Notes (Signed)
Pt resting with family at bedside. Baseline confusion per family.

## 2018-07-21 NOTE — ED Provider Notes (Signed)
[facility] Emergency Department Provider Note  ____________________________________________  Time seen: Approximately 11:00 PM  I have reviewed the triage vital signs and the nursing notes.   HISTORY  Chief Complaint Shortness of Breath  Level 5 Caveat: Portions of the History and Physical including HPI and review of systems are unable to be completely obtained due to patient being a poor historian    HPI Michael Wright is a 77 y.o. male with a history of chronic diastolic heart failure, CKD, dementia, diabetes and chronic respiratory failure on nighttime oxygen who reports that while sitting in a movie theater today he suddenly felt short of breath.  This is after taking all his usual medicines today including his nebulizer treatment at home.  He is brought to primary care who sent him to the ED for further evaluation.  He reports that he feels fine.  He does have a nonproductive cough which is improved from previous.  Denies chest pain.  Ongoing mild shortness of breath.  No fevers chills or sweats.   Baseline mental status per family.  He did have a hospitalization within the last few months due to respiratory failure   Past Medical History:  Diagnosis Date  . Anemia   . Arthritis    shoulders, knees, lower spine  . BPH (benign prostatic hyperplasia)   . Chronic diastolic (congestive) heart failure ([place])   . Chronic kidney disease (CKD), stage III (moderate) (HCC)   . Cough   . Dementia ([place])   . Diabetes mellitus, type 2 ([place])   . GERD (gastroesophageal reflux disease)   . Gout   . Hyperlipidemia   . Hypertension   . Kidney stones   . Neuropathy    feet  . TIA (transient ischemic attack) 2016   "several"     Patient Active Problem List   Diagnosis Date Noted  . Acute pulmonary edema (HCC)   . Acute respiratory failure with hypoxia and hypercapnia ([place]) 04/19/2018  . AKI (acute kidney injury) ([place]) 05/15/2015  . Stroke ([place])  05/15/2015  . Type 2 diabetes mellitus ([place]) 05/15/2015  . HTN (hypertension) 05/15/2015  . HLD (hyperlipidemia) 05/15/2015  . Abdominal pain, right upper quadrant 05/15/2015  . Hyperlipidemia   . Hypertension   . Anemia   . Diabetes mellitus, type 2 ([place])   . Extrinsic asthma, unspecified      Past Surgical History:  Procedure Laterality Date  . ADENOIDECTOMY  at age 79  . SHOULDER SURGERY    . TONSILLECTOMY    . VESICOSTOMY       Prior to Admission medications   Medication Sig Start Date End Date Taking? Authorizing Provider  allopurinol (ZYLOPRIM) 100 MG tablet Take 100 mg by mouth 2 (two) times daily.     [provider]  aspirin EC 81 MG tablet Take 81 mg by mouth daily.    [provider]  carvedilol (COREG) 3.125 MG tablet Take 3.125 mg by mouth 2 (two) times daily with a meal.    [provider]  colchicine 0.6 MG tablet Take 0.6 mg by mouth 2 (two) times daily as needed (for gout flares).    [provider]  Cyanocobalamin 3000 MCG SUBL Place 3,000 mcg under the tongue daily at 12 noon.    [provider]  cyclobenzaprine (FLEXERIL) 5 MG tablet Take 1 tablet (5 mg total) by mouth 3 (three) times daily as needed for muscle spasms (aches). Patient not taking: Reported on 06/20/2018 04/26/18  Gladstone Lighter, MD  donepezil (ARICEPT) 10 MG tablet Take 10 mg by mouth daily.  09/13/15 04/24/19  [provider]  doxycycline (VIBRAMYCIN) 100 MG capsule Take 1 capsule (100 mg total) by mouth 2 (two) times daily. 07/21/18   Carrie Mew, MD  finasteride (PROSCAR) 5 MG tablet Take 1 tablet (5 mg total) by mouth daily. 04/26/18   Gladstone Lighter, MD  furosemide (LASIX) 40 MG tablet Take 1 tablet (40 mg total) by mouth daily. 04/26/18   Gladstone Lighter, MD  HYDROcodone-homatropine [facility]) 5-1.5 MG/5ML syrup Take 3 mLs by mouth every 6 (six) hours as needed for cough. 07/21/18   Carrie Mew, MD  insulin glargine  (LANTUS) 100 unit/mL SOPN Inject 0.36 mLs (36 Units total) into the skin at bedtime. Patient taking differently: Inject 42 Units into the skin at bedtime.  04/26/18   Gladstone Lighter, MD  ipratropium-albuterol (DUONEB) 0.5-2.5 (3) MG/3ML SOLN Take 3 mLs by nebulization every 6 (six) hours as needed (For wheezing and shortness of breath). 04/26/18   Gladstone Lighter, MD  loratadine (CLARITIN) 10 MG tablet Take 10 mg by mouth daily.    [provider]  metFORMIN (GLUCOPHAGE-XR) 500 MG 24 hr tablet Take 500 mg by mouth 2 (two) times daily.    [provider]  OXYGEN Inhale into the lungs at bedtime as needed.    [provider]  pantoprazole (PROTONIX) 40 MG tablet Take 1 tablet (40 mg total) by mouth daily. 04/26/18   Gladstone Lighter, MD  potassium chloride 20 MEQ TBCR Take 20 mEq by mouth daily. While on lasix 04/26/18   Gladstone Lighter, MD  pravastatin (PRAVACHOL) 40 MG tablet Take 40 mg by mouth at bedtime.     [provider]  predniSONE (DELTASONE) 20 MG tablet Take 1 tablet (20 mg total) by mouth 2 (two) times daily with a meal. 07/21/18   Carrie Mew, MD  traZODone (DESYREL) 50 MG tablet Take 1 tablet (50 mg total) by mouth at bedtime. 04/26/18   Gladstone Lighter, MD     Allergies Tramadol   Family History  Problem Relation Age of Onset  . Allergies Daughter   . Diabetes Father     Social History Social History   Tobacco Use  . Smoking status: Never Smoker  . Smokeless tobacco: Former Systems developer    Types: Chew  Substance Use Topics  . Alcohol use: No  . Drug use: No    Review of Systems  Constitutional:   No fever or chills.  ENT:   No sore throat. No rhinorrhea. Cardiovascular:   No chest pain or syncope. Respiratory:   Positive shortness of breath and nonproductive cough. Gastrointestinal:   Negative for abdominal pain, vomiting and diarrhea.  Musculoskeletal:   Negative for focal pain or swelling All other systems  reviewed and are negative except as documented above in ROS and HPI.  ____________________________________________   PHYSICAL EXAM:  VITAL SIGNS: ED Triage Vitals  Enc Vitals Group     BP 07/21/18 1537 (!) 144/69     Pulse Rate 07/21/18 1537 83     Resp 07/21/18 1537 16     Temp 07/21/18 1537 98.5 F (36.9 C)     Temp Source 07/21/18 1537 Oral     SpO2 07/21/18 1537 96 %     Weight 07/21/18 1539 223 lb (101.2 kg)     Height 07/21/18 1539 5\' 7"  (1.702 m)     Head Circumference --      Peak Flow --  Pain Score 07/21/18 1539 0     Pain Loc --      Pain Edu? --      Excl. in Lane? --     Vital signs reviewed, nursing assessments reviewed.   Constitutional:   Alert and oriented person and place. Non-toxic appearance. Eyes:   Conjunctivae are normal. EOMI. PERRL. ENT      Head:   Normocephalic and atraumatic.      Nose:   No congestion/rhinnorhea.       Mouth/Throat:   MMM, no pharyngeal erythema. No peritonsillar mass.       Neck:   No meningismus. Full ROM. Hematological/Lymphatic/Immunilogical:   No cervical lymphadenopathy. Cardiovascular:   RRR. Symmetric bilateral radial and DP pulses.  No murmurs. Cap refill less than 2 seconds. Respiratory:   Normal respiratory effort without tachypnea/retractions. Breath sounds are clear and equal bilaterally. No wheezes/rales/rhonchi. Gastrointestinal:   Soft and nontender. Non distended. There is no CVA tenderness.  No rebound, rigidity, or guarding. Musculoskeletal:   Normal range of motion in all extremities. No joint effusions.  No lower extremity tenderness.  No edema. Neurologic:   Normal speech and language.  Motor grossly intact. No acute focal neurologic deficits are appreciated.  Skin:    Skin is warm, dry and intact. No rash noted.  No petechiae, purpura, or bullae.  ____________________________________________    LABS (pertinent positives/negatives) (all labs ordered are listed, but only abnormal results are  displayed) Labs Reviewed  CBC - Abnormal; Notable for the following components:      Result Value   WBC 14.6 (*)    All other components within normal limits  BASIC METABOLIC PANEL - Abnormal; Notable for the following components:   CO2 40 (*)    Glucose, Bld 184 (*)    Calcium 8.6 (*)    Anion gap 3 (*)    All other components within normal limits  TROPONIN I - Abnormal; Notable for the following components:   Troponin I 0.03 (*)    All other components within normal limits  BLOOD GAS, ARTERIAL - Abnormal; Notable for the following components:   pCO2 arterial 59 (*)    Bicarbonate 37.4 (*)    Acid-Base Excess 10.4 (*)    All other components within normal limits  TROPONIN I   ____________________________________________   EKG  Interpreted by me Sinus rhythm rate of 92, right axis.  Right bundle branch block.  No acute ischemic changes.  ____________________________________________    RADIOLOGY  Dg Chest 2 View  Result Date: 07/21/2018 CLINICAL DATA:  77 year old male with episode of shortness of breath. EXAM: CHEST - 2 VIEW COMPARISON:  04/23/2018 chest radiographs, chest CTA 04/19/2018, and earlier. FINDINGS: Densely calcified left lower lobe lung nodule redemonstrated and appears benign. Stable somewhat low lung volumes. Stable cardiac size and mediastinal contours. Visualized tracheal air column is within normal limits. Increased linear atelectasis in the right mid lung with stable underlying increased pulmonary interstitial markings. Small right pleural effusion or pleural collection suspected and stable. No pneumothorax. No acute pulmonary opacity. Stable visualized osseous structures. Paucity of bowel gas in the upper abdomen. IMPRESSION: Stable with suspected chronic lung disease, possibly chronic interstitial lung disease (ILD). No acute cardiopulmonary abnormality identified. Electronically Signed   By: Genevie Ann M.D.   On: 07/21/2018 16:17   Ct Angio Chest Pe W And/or  Wo Contrast  Result Date: 07/21/2018 CLINICAL DATA:  Difficulty breathing and disorientation. Low O2 saturations. EXAM: CT ANGIOGRAPHY CHEST WITH CONTRAST  TECHNIQUE: Multidetector CT imaging of the chest was performed using the standard protocol during bolus administration of intravenous contrast. Multiplanar CT image reconstructions and MIPs were obtained to evaluate the vascular anatomy. CONTRAST:  20mL OMNIPAQUE IOHEXOL 350 MG/ML SOLN COMPARISON:  Chest CT 04/19/2018 FINDINGS: Cardiovascular: The heart is mildly enlarged but stable. No pericardial effusion. The aorta is normal in caliber. No dissection. No significant atherosclerotic calcifications. The branch vessels are patent. Fairly extensive three-vessel coronary artery calcifications are noted. The pulmonary arterial tree is fairly well opacified. No filling defects to suggest pulmonary embolism. Stable enlarged pulmonary artery suggesting pulmonary hypertension. Mediastinum/Nodes: Stable borderline mediastinal and hilar lymph nodes but no mass or overt adenopathy. The esophagus is grossly normal. Lungs/Pleura: Patchy mosaic pattern of ground-glass opacity typically seen with small airways disease and may be due to reactive airways disease such as asthma, respiratory bronchiolitis, hypersensitivity pneumonitis or possibly cryptogenic organizing pneumonia. No pulmonary edema or focal airspace consolidation to suggest typical pneumonia. Small bilateral pleural effusions, right greater than left with minimal overlying atelectasis. Large calcified granuloma noted at the left lung base along with calcified left hilar lymph nodes. No worrisome pulmonary lesions. Upper Abdomen: No significant upper abdominal findings. Musculoskeletal: No chest wall mass, supraclavicular or axillary adenopathy. The thyroid gland appears normal. No significant bony findings. Review of the MIP images confirms the above findings. IMPRESSION: 1. No CT findings for pulmonary  embolism. 2. Normal thoracic aorta. 3. Stable advanced three-vessel coronary artery calcifications. 4. Enlarged pulmonary artery suggesting pulmonary hypertension. 5. Mosaic pattern of ground-glass attenuation in the lungs without focal airspace consolidation or pulmonary edema. Please see above discussion. 6. Small bilateral pleural effusions, right greater than left. Aortic Atherosclerosis (ICD10-I70.0). Electronically Signed   By: Marijo Sanes M.D.   On: 07/21/2018 19:42    ____________________________________________   PROCEDURES Procedures  ____________________________________________  DIFFERENTIAL DIAGNOSIS   Pulmonary edema, pleural effusion, pneumonia, pneumothorax, pulmonary embolism  CLINICAL IMPRESSION / ASSESSMENT AND PLAN / ED COURSE  Pertinent labs & imaging results that were available during my care of the patient were reviewed by me and considered in my medical decision making (see chart for details).    Patient presents with shortness of breath and cough.  Clear lungs on my exam.  Chest x-ray nondiagnostic.  He does have chronic respiratory disease, does have a recent hospitalization.  I consider him at high risk for pulmonary embolism and will obtain a CT scan to further evaluate.  ----------------------------------------- 11:09 PM on 07/21/2018 -----------------------------------------  CT scan overall unremarkable, negative for PE.  Shows some nonspecific haziness suggestive of pneumonitis.  Possibly early pneumonia.  Serial troponins are negative.  Sats in the mid 90s on his typical home oxygen while asleep.  I will start him on doxycycline, prednisone, follow-up with pulmonology and primary care.  Family are comfortable with this plan.  Return if worse.      ____________________________________________   FINAL CLINICAL IMPRESSION(S) / ED DIAGNOSES    Final diagnoses:  Shortness of breath     ED Discharge Orders         Ordered    doxycycline  (VIBRAMYCIN) 100 MG capsule  2 times daily     07/21/18 2258    predniSONE (DELTASONE) 20 MG tablet  2 times daily with meals     07/21/18 2258    HYDROcodone-homatropine (HYCODAN) 5-1.5 MG/5ML syrup  Every 6 hours PRN     07/21/18 2300          Portions of this  note were generated with dragon dictation software. Dictation errors may occur despite best attempts at proofreading.    Carrie Mew, MD 07/21/18 2310

## 2018-07-21 NOTE — ED Triage Notes (Signed)
Pt in via POV, sent over from [facility] due to acute episode shortness of breath while sitting in a movie, panting and becoming disoriented.  Per [place], pt with saturation in the 70's upon arrival on room air.  Pt on 3L upon arrival here, in no acute distress, saturation WDL.

## 2018-07-21 NOTE — ED Notes (Signed)
Pt up to use bedside toilet.

## 2018-07-21 NOTE — ED Notes (Signed)
Resp at bedside to collect ABG.

## 2018-07-21 NOTE — ED Notes (Signed)
Pt up to bathroom.

## 2018-07-25 ENCOUNTER — Encounter: Payer: Self-pay | Admitting: Emergency Medicine

## 2018-07-25 ENCOUNTER — Emergency Department: Payer: Medicare HMO

## 2018-07-25 ENCOUNTER — Other Ambulatory Visit: Payer: Self-pay

## 2018-07-25 ENCOUNTER — Inpatient Hospital Stay
Admission: EM | Admit: 2018-07-25 | Discharge: 2018-07-27 | DRG: 193 | Disposition: A | Discharge status: Home Health | Payer: Medicare HMO | Attending: Specialist | Admitting: Specialist

## 2018-07-25 DIAGNOSIS — I130 Hypertensive heart and chronic kidney disease with heart failure and stage 1 through stage 4 chronic kidney disease, or unspecified chronic kidney disease: Secondary | ICD-10-CM | POA: Diagnosis present

## 2018-07-25 DIAGNOSIS — E1122 Type 2 diabetes mellitus with diabetic chronic kidney disease: Secondary | ICD-10-CM | POA: Diagnosis present

## 2018-07-25 DIAGNOSIS — N400 Enlarged prostate without lower urinary tract symptoms: Secondary | ICD-10-CM | POA: Diagnosis present

## 2018-07-25 DIAGNOSIS — F0390 Unspecified dementia without behavioral disturbance: Secondary | ICD-10-CM | POA: Diagnosis present

## 2018-07-25 DIAGNOSIS — E785 Hyperlipidemia, unspecified: Secondary | ICD-10-CM | POA: Diagnosis present

## 2018-07-25 DIAGNOSIS — D649 Anemia, unspecified: Secondary | ICD-10-CM | POA: Diagnosis present

## 2018-07-25 DIAGNOSIS — Z888 Allergy status to other drugs, medicaments and biological substances status: Secondary | ICD-10-CM | POA: Diagnosis not present

## 2018-07-25 DIAGNOSIS — J441 Chronic obstructive pulmonary disease with (acute) exacerbation: Secondary | ICD-10-CM | POA: Diagnosis not present

## 2018-07-25 DIAGNOSIS — J9622 Acute and chronic respiratory failure with hypercapnia: Secondary | ICD-10-CM | POA: Diagnosis not present

## 2018-07-25 DIAGNOSIS — I5032 Chronic diastolic (congestive) heart failure: Secondary | ICD-10-CM | POA: Diagnosis not present

## 2018-07-25 DIAGNOSIS — G92 Toxic encephalopathy: Secondary | ICD-10-CM | POA: Diagnosis present

## 2018-07-25 DIAGNOSIS — J90 Pleural effusion, not elsewhere classified: Secondary | ICD-10-CM | POA: Diagnosis not present

## 2018-07-25 DIAGNOSIS — T380X5A Adverse effect of glucocorticoids and synthetic analogues, initial encounter: Secondary | ICD-10-CM | POA: Diagnosis present

## 2018-07-25 DIAGNOSIS — R4182 Altered mental status, unspecified: Secondary | ICD-10-CM | POA: Diagnosis not present

## 2018-07-25 DIAGNOSIS — N179 Acute kidney failure, unspecified: Secondary | ICD-10-CM | POA: Diagnosis not present

## 2018-07-25 DIAGNOSIS — K219 Gastro-esophageal reflux disease without esophagitis: Secondary | ICD-10-CM | POA: Diagnosis present

## 2018-07-25 DIAGNOSIS — I248 Other forms of acute ischemic heart disease: Secondary | ICD-10-CM | POA: Diagnosis not present

## 2018-07-25 DIAGNOSIS — J9602 Acute respiratory failure with hypercapnia: Secondary | ICD-10-CM | POA: Diagnosis not present

## 2018-07-25 DIAGNOSIS — J440 Chronic obstructive pulmonary disease with acute lower respiratory infection: Secondary | ICD-10-CM | POA: Diagnosis present

## 2018-07-25 DIAGNOSIS — R7989 Other specified abnormal findings of blood chemistry: Secondary | ICD-10-CM | POA: Diagnosis not present

## 2018-07-25 DIAGNOSIS — J189 Pneumonia, unspecified organism: Principal | ICD-10-CM

## 2018-07-25 DIAGNOSIS — M1A9XX0 Chronic gout, unspecified, without tophus (tophi): Secondary | ICD-10-CM | POA: Diagnosis present

## 2018-07-25 DIAGNOSIS — J9621 Acute and chronic respiratory failure with hypoxia: Secondary | ICD-10-CM | POA: Diagnosis not present

## 2018-07-25 DIAGNOSIS — E1165 Type 2 diabetes mellitus with hyperglycemia: Secondary | ICD-10-CM | POA: Diagnosis present

## 2018-07-25 DIAGNOSIS — N183 Chronic kidney disease, stage 3 (moderate): Secondary | ICD-10-CM | POA: Diagnosis present

## 2018-07-25 DIAGNOSIS — Z794 Long term (current) use of insulin: Secondary | ICD-10-CM | POA: Diagnosis not present

## 2018-07-25 DIAGNOSIS — Z9981 Dependence on supplemental oxygen: Secondary | ICD-10-CM | POA: Diagnosis not present

## 2018-07-25 DIAGNOSIS — J9601 Acute respiratory failure with hypoxia: Secondary | ICD-10-CM

## 2018-07-25 DIAGNOSIS — Z7982 Long term (current) use of aspirin: Secondary | ICD-10-CM | POA: Diagnosis not present

## 2018-07-25 DIAGNOSIS — J449 Chronic obstructive pulmonary disease, unspecified: Secondary | ICD-10-CM | POA: Diagnosis not present

## 2018-07-25 DIAGNOSIS — R778 Other specified abnormalities of plasma proteins: Secondary | ICD-10-CM

## 2018-07-25 DIAGNOSIS — Z79899 Other long term (current) drug therapy: Secondary | ICD-10-CM

## 2018-07-25 DIAGNOSIS — E119 Type 2 diabetes mellitus without complications: Secondary | ICD-10-CM | POA: Diagnosis not present

## 2018-07-25 DIAGNOSIS — R0689 Other abnormalities of breathing: Secondary | ICD-10-CM | POA: Diagnosis not present

## 2018-07-25 DIAGNOSIS — I2510 Atherosclerotic heart disease of native coronary artery without angina pectoris: Secondary | ICD-10-CM | POA: Diagnosis not present

## 2018-07-25 DIAGNOSIS — J45909 Unspecified asthma, uncomplicated: Secondary | ICD-10-CM | POA: Diagnosis not present

## 2018-07-25 LAB — CBC WITH DIFFERENTIAL/PLATELET
Abs Immature Granulocytes: 0.11 10*3/uL — ABNORMAL HIGH (ref 0.00–0.07)
BASOS PCT: 0 %
Basophils Absolute: 0 10*3/uL (ref 0.0–0.1)
EOS ABS: 0 10*3/uL (ref 0.0–0.5)
EOS PCT: 0 %
HCT: 48.3 % (ref 39.0–52.0)
HEMOGLOBIN: 14 g/dL (ref 13.0–17.0)
Immature Granulocytes: 1 %
LYMPHS PCT: 6 %
Lymphs Abs: 0.9 10*3/uL (ref 0.7–4.0)
MCH: 27.8 pg (ref 26.0–34.0)
MCHC: 29 g/dL — AB (ref 30.0–36.0)
MCV: 96 fL (ref 80.0–100.0)
Monocytes Absolute: 1.2 10*3/uL — ABNORMAL HIGH (ref 0.1–1.0)
Monocytes Relative: 8 %
NRBC: 0 % (ref 0.0–0.2)
Neutro Abs: 12.9 10*3/uL — ABNORMAL HIGH (ref 1.7–7.7)
Neutrophils Relative %: 85 %
PLATELETS: 247 10*3/uL (ref 150–400)
RBC: 5.03 MIL/uL (ref 4.22–5.81)
RDW: 12.6 % (ref 11.5–15.5)
WBC: 15.1 10*3/uL — AB (ref 4.0–10.5)

## 2018-07-25 LAB — GLUCOSE, CAPILLARY
Glucose-Capillary: 345 mg/dL — ABNORMAL HIGH (ref 70–99)
Glucose-Capillary: 181 mg/dL — ABNORMAL HIGH (ref 70–99)

## 2018-07-25 LAB — BLOOD GAS, ARTERIAL
Acid-Base Excess: 10.5 mmol/L — ABNORMAL HIGH (ref 0.0–2.0)
BICARBONATE: 40.3 mmol/L — AB (ref 20.0–28.0)
Delivery systems: POSITIVE
EXPIRATORY PAP: 8
FIO2: 35
INSPIRATORY PAP: 16
O2 SAT: 89.1 %
Patient temperature: 37
pCO2 arterial: 80 mmHg (ref 32.0–48.0)
pH, Arterial: 7.31 — ABNORMAL LOW (ref 7.350–7.450)
pO2, Arterial: 62 mmHg — ABNORMAL LOW (ref 83.0–108.0)

## 2018-07-25 LAB — MRSA PCR SCREENING: MRSA by PCR: NEGATIVE

## 2018-07-25 LAB — BASIC METABOLIC PANEL
Anion gap: 9 (ref 5–15)
BUN: 24 mg/dL — ABNORMAL HIGH (ref 8–23)
CALCIUM: 8.8 mg/dL — AB (ref 8.9–10.3)
CO2: 37 mmol/L — ABNORMAL HIGH (ref 22–32)
CREATININE: 1.25 mg/dL — AB (ref 0.61–1.24)
Chloride: 96 mmol/L — ABNORMAL LOW (ref 98–111)
GFR calc Af Amer: >60 mL/min (ref >60)
GFR, EST NON AFRICAN AMERICAN: 54 mL/min — AB (ref >60)
Glucose, Bld: 338 mg/dL — ABNORMAL HIGH (ref 70–99)
Potassium: 4 mmol/L (ref 3.5–5.1)
SODIUM: 142 mmol/L (ref 135–145)

## 2018-07-25 LAB — BLOOD GAS, VENOUS
Acid-Base Excess: 9.6 mmol/L — ABNORMAL HIGH (ref 0.0–2.0)
Bicarbonate: 42.2 mmol/L — ABNORMAL HIGH (ref 20.0–28.0)
O2 Saturation: 74.5 %
PATIENT TEMPERATURE: 37
PO2 VEN: 48 mmHg — AB (ref 32.0–45.0)
pCO2, Ven: 103 mmHg (ref 44.0–60.0)
pH, Ven: 7.22 — ABNORMAL LOW (ref 7.250–7.430)

## 2018-07-25 LAB — BRAIN NATRIURETIC PEPTIDE: B NATRIURETIC PEPTIDE 5: 314 pg/mL — AB (ref 0.0–100.0)

## 2018-07-25 LAB — HEMOGLOBIN A1C
Hgb A1c MFr Bld: 10.1 % — ABNORMAL HIGH (ref 4.8–5.6)
Mean Plasma Glucose: 243.17 mg/dL

## 2018-07-25 LAB — TROPONIN I
TROPONIN I: 0.23 ng/mL — AB (ref <0.03)
TROPONIN I: 0.19 ng/mL — AB (ref <0.03)
Troponin I: 0.23 ng/mL (ref <0.03)

## 2018-07-25 MED ORDER — PRAVASTATIN SODIUM 40 MG PO TABS
40.0000 mg | ORAL_TABLET | Freq: Every day | ORAL | Status: DC
  Administered 2018-07-25 – 2018-07-26 (×2): 40 mg via ORAL
  Filled 2018-07-25: qty 2
  Filled 2018-07-25 (×3): qty 1

## 2018-07-25 MED ORDER — CARVEDILOL 3.125 MG PO TABS
3.1250 mg | ORAL_TABLET | Freq: Two times a day (BID) | ORAL | Status: DC
  Administered 2018-07-26 – 2018-07-27 (×3): 3.125 mg via ORAL
  Filled 2018-07-25 (×3): qty 1

## 2018-07-25 MED ORDER — FINASTERIDE 5 MG PO TABS
5.0000 mg | ORAL_TABLET | Freq: Every day | ORAL | Status: DC
  Administered 2018-07-26 – 2018-07-27 (×2): 5 mg via ORAL
  Filled 2018-07-25 (×2): qty 1

## 2018-07-25 MED ORDER — ARFORMOTEROL TARTRATE 15 MCG/2ML IN NEBU
15.0000 ug | INHALATION_SOLUTION | Freq: Two times a day (BID) | RESPIRATORY_TRACT | Status: DC
  Administered 2018-07-25 – 2018-07-27 (×3): 15 ug via RESPIRATORY_TRACT
  Filled 2018-07-25 (×5): qty 2

## 2018-07-25 MED ORDER — ALLOPURINOL 100 MG PO TABS
100.0000 mg | ORAL_TABLET | Freq: Two times a day (BID) | ORAL | Status: DC
  Administered 2018-07-25 – 2018-07-27 (×4): 100 mg via ORAL
  Filled 2018-07-25 (×6): qty 1

## 2018-07-25 MED ORDER — METHYLPREDNISOLONE SODIUM SUCC 40 MG IJ SOLR
40.0000 mg | Freq: Two times a day (BID) | INTRAMUSCULAR | Status: DC
  Administered 2018-07-26 – 2018-07-27 (×4): 40 mg via INTRAVENOUS
  Filled 2018-07-25 (×4): qty 1

## 2018-07-25 MED ORDER — PANTOPRAZOLE SODIUM 40 MG PO TBEC
40.0000 mg | DELAYED_RELEASE_TABLET | Freq: Every day | ORAL | Status: DC
  Administered 2018-07-26 – 2018-07-27 (×2): 40 mg via ORAL
  Filled 2018-07-25 (×2): qty 1

## 2018-07-25 MED ORDER — ONDANSETRON HCL 4 MG PO TABS: 4.0000 mg | ORAL_TABLET | Freq: Four times a day (QID) | ORAL | Status: DC | PRN

## 2018-07-25 MED ORDER — DONEPEZIL HCL 5 MG PO TABS
10.0000 mg | ORAL_TABLET | Freq: Every day | ORAL | Status: DC
  Administered 2018-07-26 – 2018-07-27 (×2): 10 mg via ORAL
  Filled 2018-07-25 (×2): qty 2

## 2018-07-25 MED ORDER — ASPIRIN EC 81 MG PO TBEC
81.0000 mg | DELAYED_RELEASE_TABLET | Freq: Every day | ORAL | Status: DC
  Administered 2018-07-26 – 2018-07-27 (×2): 81 mg via ORAL
  Filled 2018-07-25 (×2): qty 1

## 2018-07-25 MED ORDER — LEVOFLOXACIN IN D5W 750 MG/150ML IV SOLN
750.0000 mg | Freq: Once | INTRAVENOUS | Status: AC
  Administered 2018-07-25: 750 mg via INTRAVENOUS
  Filled 2018-07-25: qty 150

## 2018-07-25 MED ORDER — ACETAMINOPHEN 650 MG RE SUPP: 650.0000 mg | Freq: Four times a day (QID) | RECTAL | Status: DC | PRN

## 2018-07-25 MED ORDER — INSULIN ASPART 100 UNIT/ML ~~LOC~~ SOLN
0.0000 [IU] | Freq: Every day | SUBCUTANEOUS | Status: DC
  Administered 2018-07-26: 2 [IU] via SUBCUTANEOUS
  Filled 2018-07-25: qty 1

## 2018-07-25 MED ORDER — LORATADINE 10 MG PO TABS
10.0000 mg | ORAL_TABLET | Freq: Every day | ORAL | Status: DC
  Administered 2018-07-26 – 2018-07-27 (×2): 10 mg via ORAL
  Filled 2018-07-25 (×2): qty 1

## 2018-07-25 MED ORDER — INSULIN GLARGINE 100 UNIT/ML ~~LOC~~ SOLN
42.0000 [IU] | Freq: Every day | SUBCUTANEOUS | Status: DC
  Administered 2018-07-25 – 2018-07-26 (×2): 42 [IU] via SUBCUTANEOUS
  Filled 2018-07-25 (×4): qty 0.42

## 2018-07-25 MED ORDER — INSULIN ASPART 100 UNIT/ML ~~LOC~~ SOLN
0.0000 [IU] | Freq: Three times a day (TID) | SUBCUTANEOUS | Status: DC
  Administered 2018-07-25: 15 [IU] via SUBCUTANEOUS
  Administered 2018-07-26: 4 [IU] via SUBCUTANEOUS
  Administered 2018-07-26: 3 [IU] via SUBCUTANEOUS
  Administered 2018-07-26: 7 [IU] via SUBCUTANEOUS
  Administered 2018-07-27: 3 [IU] via SUBCUTANEOUS
  Administered 2018-07-27: 7 [IU] via SUBCUTANEOUS
  Filled 2018-07-25 (×5): qty 1

## 2018-07-25 MED ORDER — BUDESONIDE 0.5 MG/2ML IN SUSP
0.5000 mg | Freq: Two times a day (BID) | RESPIRATORY_TRACT | Status: DC
  Administered 2018-07-25 – 2018-07-27 (×4): 0.5 mg via RESPIRATORY_TRACT
  Filled 2018-07-25 (×4): qty 2

## 2018-07-25 MED ORDER — ACETAMINOPHEN 325 MG PO TABS: 650.0000 mg | ORAL_TABLET | Freq: Four times a day (QID) | ORAL | Status: DC | PRN

## 2018-07-25 MED ORDER — POTASSIUM CHLORIDE CRYS ER 20 MEQ PO TBCR
20.0000 meq | EXTENDED_RELEASE_TABLET | Freq: Every day | ORAL | Status: DC
  Administered 2018-07-26 – 2018-07-27 (×2): 20 meq via ORAL
  Filled 2018-07-25 (×2): qty 1

## 2018-07-25 MED ORDER — ENOXAPARIN SODIUM 40 MG/0.4ML ~~LOC~~ SOLN
40.0000 mg | SUBCUTANEOUS | Status: DC
  Administered 2018-07-26 – 2018-07-27 (×2): 40 mg via SUBCUTANEOUS
  Filled 2018-07-25 (×2): qty 0.4

## 2018-07-25 MED ORDER — IPRATROPIUM-ALBUTEROL 0.5-2.5 (3) MG/3ML IN SOLN
3.0000 mL | Freq: Four times a day (QID) | RESPIRATORY_TRACT | Status: DC
  Administered 2018-07-25 – 2018-07-27 (×7): 3 mL via RESPIRATORY_TRACT
  Filled 2018-07-25 (×7): qty 3

## 2018-07-25 MED ORDER — FUROSEMIDE 40 MG PO TABS
40.0000 mg | ORAL_TABLET | Freq: Every day | ORAL | Status: DC
  Administered 2018-07-26 – 2018-07-27 (×2): 40 mg via ORAL
  Filled 2018-07-25: qty 2
  Filled 2018-07-25: qty 1

## 2018-07-25 MED ORDER — TRAZODONE HCL 50 MG PO TABS
50.0000 mg | ORAL_TABLET | Freq: Every day | ORAL | Status: DC
  Administered 2018-07-25 – 2018-07-26 (×2): 50 mg via ORAL
  Filled 2018-07-25 (×2): qty 1

## 2018-07-25 MED ORDER — ONDANSETRON HCL 4 MG/2ML IJ SOLN: 4.0000 mg | Freq: Four times a day (QID) | INTRAMUSCULAR | Status: DC | PRN

## 2018-07-25 MED ORDER — LEVOFLOXACIN IN D5W 750 MG/150ML IV SOLN
750.0000 mg | INTRAVENOUS | Status: DC
  Administered 2018-07-26: 750 mg via INTRAVENOUS
  Filled 2018-07-25 (×2): qty 150

## 2018-07-25 NOTE — ED Notes (Signed)
Patient denies pain and is resting comfortably.  

## 2018-07-25 NOTE — ED Notes (Signed)
Returned call to ICU.

## 2018-07-25 NOTE — ED Notes (Signed)
Nurse was informed that no one is able to take report due to situation on the floor. Our charge nurse Nira Conn to be notified.

## 2018-07-25 NOTE — H&P (Signed)
[facility] [place] NAME: Michael Wright    MR#:  [id number]  DATE OF BIRTH:  Sep 27, 1940  DATE OF ADMISSION:  07/25/2018  PRIMARY CARE PHYSICIAN: [place] Pink, MD   REQUESTING/REFERRING PHYSICIAN: Dr Cephas Darby  CHIEF COMPLAINT:   Chief Complaint  Patient presents with  . Shortness of Breath    HISTORY OF PRESENT ILLNESS:  Michael Wright  is a 76 y.o. male with a known history of end-stage COPD.  The patient lives by himself and has dementia.  He has not been doing well since August when they diagnosed him with CHF.  He was brought in on Thursday with some slurred speech and some stumbling and sent home.  Daughter states it is not easy to keep his oxygen on and he is a mouth breather anyway.  With he has been very shaky has been hallucinating his weight has been going up.  He has been having trouble breathing going on for a long time.  In the ER a venous blood gas showed a PCO2 of 103 and a pH of 7.22.  Hospitalist services were contacted for further evaluation and patient was placed on BiPAP.  PAST MEDICAL HISTORY:   Past Medical History:  Diagnosis Date  . Anemia   . Arthritis    shoulders, knees, lower spine  . BPH (benign prostatic hyperplasia)   . Chronic diastolic (congestive) heart failure ([place])   . Chronic kidney disease (CKD), stage III (moderate) (HCC)   . Cough   . Dementia ([place])   . Diabetes mellitus, type 2 ([place])   . GERD (gastroesophageal reflux disease)   . Gout   . Hyperlipidemia   . Hypertension   . Kidney stones   . Neuropathy    feet  . TIA (transient ischemic attack) 2016   "several"    PAST SURGICAL HISTORY:   Past Surgical History:  Procedure Laterality Date  . ADENOIDECTOMY  at age 34  . SHOULDER SURGERY    . TONSILLECTOMY    . VESICOSTOMY      SOCIAL HISTORY:   Social History   Tobacco Use  . Smoking status: Never Smoker  . Smokeless tobacco: Former Systems developer     Types: Chew  Substance Use Topics  . Alcohol use: No    FAMILY HISTORY:   Family History  Problem Relation Age of Onset  . Allergies Daughter   . Diabetes Father     DRUG ALLERGIES:   Allergies  Allergen Reactions  . Tramadol Other (See Comments)    Light Headedness    REVIEW OF SYSTEMS:  CONSTITUTIONAL: No fever, daughter states he feels cold all the time.  Positive for fatigue.  EYES: Has cataracts EARS, NOSE, AND THROAT: No sore throat RESPIRATORY: Positive cough, shortness of breath, and wheezing.  No hemoptysis.  CARDIOVASCULAR: No chest pain, orthopnea, edema.  GASTROINTESTINAL: No nausea, vomiting, or abdominal pain. No blood in bowel movements.  Some diarrhea GENITOURINARY: Some dysuria, no hematuria.  ENDOCRINE: No polyuria, nocturia,  HEMATOLOGY: No anemia, easy bruising or bleeding SKIN: No rash or lesion. MUSCULOSKELETAL: No joint pain or arthritis.   NEUROLOGIC: No tingling, numbness, weakness.  PSYCHIATRY: No anxiety or depression.   MEDICATIONS AT HOME:   Prior to Admission medications   Medication Sig Start Date End Date Taking? Authorizing Provider  allopurinol (ZYLOPRIM) 100 MG tablet Take 100 mg by mouth 2 (two) times daily.    Yes [provider]  aspirin EC 81 MG  tablet Take 81 mg by mouth daily.   Yes [provider]  carvedilol (COREG) 3.125 MG tablet Take 3.125 mg by mouth 2 (two) times daily with a meal.   Yes [provider]  colchicine 0.6 MG tablet Take 0.6 mg by mouth 2 (two) times daily as needed (for gout flares).   Yes [provider]  Cyanocobalamin 3000 MCG SUBL Place 3,000 mcg under the tongue daily at 12 noon.   Yes [provider]  donepezil (ARICEPT) 10 MG tablet Take 10 mg by mouth daily.  09/13/15 04/24/19 Yes [provider]  doxycycline (VIBRAMYCIN) 100 MG capsule Take 1 capsule (100 mg total) by mouth 2 (two) times daily. 07/21/18  Yes Carrie Mew, MD  finasteride  (PROSCAR) 5 MG tablet Take 1 tablet (5 mg total) by mouth daily. 04/26/18  Yes Gladstone Lighter, MD  furosemide (LASIX) 40 MG tablet Take 1 tablet (40 mg total) by mouth daily. 04/26/18  Yes Gladstone Lighter, MD  HYDROcodone-homatropine [facility]) 5-1.5 MG/5ML syrup Take 3 mLs by mouth every 6 (six) hours as needed for cough. 07/21/18  Yes Carrie Mew, MD  insulin glargine (LANTUS) 100 unit/mL SOPN Inject 0.36 mLs (36 Units total) into the skin at bedtime. Patient taking differently: Inject 42 Units into the skin at bedtime.  04/26/18  Yes Gladstone Lighter, MD  ipratropium-albuterol (DUONEB) 0.5-2.5 (3) MG/3ML SOLN Take 3 mLs by nebulization every 6 (six) hours as needed (For wheezing and shortness of breath). 04/26/18  Yes Gladstone Lighter, MD  loratadine (CLARITIN) 10 MG tablet Take 10 mg by mouth daily.   Yes [provider]  OXYGEN Inhale into the lungs at bedtime as needed.   Yes [provider]  pantoprazole (PROTONIX) 40 MG tablet Take 1 tablet (40 mg total) by mouth daily. 04/26/18  Yes Gladstone Lighter, MD  potassium chloride 20 MEQ TBCR Take 20 mEq by mouth daily. While on lasix 04/26/18  Yes Gladstone Lighter, MD  pravastatin (PRAVACHOL) 40 MG tablet Take 40 mg by mouth at bedtime.    Yes [provider]  predniSONE (DELTASONE) 20 MG tablet Take 1 tablet (20 mg total) by mouth 2 (two) times daily with a meal. 07/21/18  Yes Carrie Mew, MD  traZODone (DESYREL) 50 MG tablet Take 1 tablet (50 mg total) by mouth at bedtime. 04/26/18  Yes Gladstone Lighter, MD  metFORMIN (GLUCOPHAGE-XR) 500 MG 24 hr tablet Take 500 mg by mouth 2 (two) times daily.    [provider]      VITAL SIGNS:  Blood pressure (!) 163/72, pulse 87, temperature 98.1 F (36.7 C), temperature source Oral, resp. rate (!) 28, height 5\' 7"  (1.702 m), weight 101.1 kg, SpO2 92 %.  PHYSICAL EXAMINATION:  GENERAL:  77 y.o.-year-old patient lying in the bed with acute  respiratory distress.  EYES: Pupils equal, round, reactive to light and accommodation. No scleral icterus. Extraocular muscles intact.  HEENT: Head atraumatic, normocephalic. Oropharynx and nasopharynx clear.  NECK:  Supple, no jugular venous distention. No thyroid enlargement, no tenderness.  LUNGS: Decreased breath sounds bilaterally, slight expiratory wheezing.no rales,rhonchi or crepitation.  Positive use of accessory muscles of respiration.  CARDIOVASCULAR: S1, S2 normal. No murmurs, rubs, or gallops.  ABDOMEN: Soft, nontender, nondistended. Bowel sounds present. No organomegaly or mass.  EXTREMITIES: Trace pedal edema, no cyanosis, or clubbing.  NEUROLOGIC: Cranial nerves II through XII are intact. Muscle strength 5/5 in all extremities. Sensation intact. Gait not checked.  PSYCHIATRIC: The patient is alert and answers  questions.  SKIN: No rash, lesion, or ulcer.   LABORATORY PANEL:   CBC Recent Labs  Lab 07/25/18 1301  WBC 15.1*  HGB 14.0  HCT 48.3  PLT 247   ------------------------------------------------------------------------------------------------------------------  Chemistries  Recent Labs  Lab 07/25/18 1301  NA 142  K 4.0  CL 96*  CO2 37*  GLUCOSE 338*  BUN 24*  CREATININE 1.25*  CALCIUM 8.8*   ------------------------------------------------------------------------------------------------------------------  Cardiac Enzymes Recent Labs  Lab 07/25/18 1301  TROPONINI 0.23*   ------------------------------------------------------------------------------------------------------------------  RADIOLOGY:  Dg Chest Port 1 View  Result Date: 07/25/2018 CLINICAL DATA:  77 year old male with shortness of breath. EXAM: PORTABLE CHEST 1 VIEW COMPARISON:  Chest CTA 07/21/2018 and earlier. FINDINGS: Portable AP upright view at 1303 hours. Low lung volumes. Patchy opacity at both lung bases appears similar to the appearance on the recent CTA with small volume right  pleural effusion suspected and fluid may now be tracking more into the right minor fissure. Calcified granuloma redemonstrated at the left lung base. Stable cardiac size and mediastinal contours. Visualized tracheal air column is within normal limits. No pneumothorax. Pulmonary vascularity is stable. IMPRESSION: Low lung volumes with patchy bibasilar opacity similar to the recent CTA on 07/21/2018 with small right pleural effusion. Electronically Signed   By: Genevie Ann M.D.   On: 07/25/2018 13:28    EKG:   Sinus rhythm 88 bpm, right bundle branch block, left anterior fascicular block  IMPRESSION AND PLAN:   1.  Acute on chronic hypercarbic hypoxic respiratory failure.  With a PCO2 of 102 patient needs to be admitted to the CCU stepdown for BiPAP treatment.  Patient will be a candidate for noninvasive ventilation at home.  We will get care manager consultation. 2.  COPD exacerbation possible pneumonia.  Solu-Medrol.  Nebulizer treatments and Levaquin. 3.  Chronic diastolic congestive heart failure continue Lasix 4.  Type 2 diabetes mellitus.  Hold oral medications.  Continue Lantus.  Sugars will be high secondary to steroids.  Sliding scale ordered. 5.  Chronic kidney disease stage III.  Continue to watch closely. 6.  History of gout on allopurinol 7.  Elevated troponin likely demand ischemia from respiratory issues 8.  BPH continue medications 9.  Hyperlipidemia on pravastatin   All the records are reviewed and case discussed with ED provider. Management plans discussed with the patient, family and they are in agreement.  CODE STATUS: Full code  TOTAL TIME TAKING CARE OF THIS PATIENT: 50 minutes, including acp time.    Loletha Grayer M.D on 07/25/2018 at 2:38 PM  Between 7am to 6pm - Pager - [phone]  After 6pm call admission pager [phone]  [facility]  [phone]  CC: Primary care physician; Maryland Pink, MD

## 2018-07-25 NOTE — Progress Notes (Signed)
Patient ID: [name], male   DOB: Jan 28, 1941, 77 y.o.   MRN: [id number]  ACP note  Patient and daughter at the bedside  Diagnosis: Acute on chronic hypercarbic hypoxic respiratory failure, COPD exacerbation with possible pneumonia, chronic diastolic congestive heart failure, type 2 diabetes mellitus, chronic kidney disease stage III, history of gout, elevated troponin, BPH, dementia and hyperlipidemia  CODE STATUS discussed patient wishes to be a full code.  Plan.  Admit to the CCU stepdown for BiPAP treatment for high PCO2.  Patient will be candidate for noninvasive ventilation at home we will get care manager consultation for this.  Daughter is concerned that the patient lives by himself and with the dementia and history of high CO2 does not think he will be able to take care of himself.  Will also get Copywriter, advertising.  Time spent on ACP discussion 17 minutes Dr. Loletha Grayer

## 2018-07-25 NOTE — ED Notes (Signed)
Provider was made aware of troponin

## 2018-07-25 NOTE — ED Provider Notes (Signed)
[facility] Emergency Department Provider Note       Time seen: ----------------------------------------- 12:35 PM on 07/25/2018 -----------------------------------------  Level V caveat: History/ROS limited by altered mental status I have reviewed the triage vital signs and the nursing notes.  HISTORY   Chief Complaint Shortness of Breath    HPI Michael Wright is a 77 y.o. male with a history of anemia, CHF, chronic kidney disease, dementia, diabetes, hyperlipidemia, hypertension, kidney stones, TIA who presents to the ED for shortness of breath as well as weakness patient was recently seen here for shortness of breath was discharged home with possible diagnosis of pneumonitis.  Patient was given doxycycline, prednisone and was encouraged to follow-up with his primary care doctor.  They went to the walk-in center today, daughter is giving report because he has a history of dementia.  Past Medical History:  Diagnosis Date  . Anemia   . Arthritis    shoulders, knees, lower spine  . BPH (benign prostatic hyperplasia)   . Chronic diastolic (congestive) heart failure ([place])   . Chronic kidney disease (CKD), stage III (moderate) (HCC)   . Cough   . Dementia ([place])   . Diabetes mellitus, type 2 ([place])   . GERD (gastroesophageal reflux disease)   . Gout   . Hyperlipidemia   . Hypertension   . Kidney stones   . Neuropathy    feet  . TIA (transient ischemic attack) 2016   "several"    Patient Active Problem List   Diagnosis Date Noted  . Acute pulmonary edema (HCC)   . Acute respiratory failure with hypoxia and hypercapnia ([place]) 04/19/2018  . AKI (acute kidney injury) ([place]) 05/15/2015  . Stroke ([place]) 05/15/2015  . Type 2 diabetes mellitus ([place]) 05/15/2015  . HTN (hypertension) 05/15/2015  . HLD (hyperlipidemia) 05/15/2015  . Abdominal pain, right upper quadrant 05/15/2015  . Hyperlipidemia   . Hypertension   . Anemia   . Diabetes mellitus,  type 2 ([place])   . Extrinsic asthma, unspecified     Past Surgical History:  Procedure Laterality Date  . ADENOIDECTOMY  at age 82  . SHOULDER SURGERY    . TONSILLECTOMY    . VESICOSTOMY      Allergies Tramadol  Social History Social History   Tobacco Use  . Smoking status: Never Smoker  . Smokeless tobacco: Former Systems developer    Types: Chew  Substance Use Topics  . Alcohol use: No  . Drug use: No   Review of Systems Unknown, reported altered mental status, weakness and shortness of breath  All systems negative/normal/unremarkable except as stated in the HPI  ____________________________________________   PHYSICAL EXAM:  VITAL SIGNS: ED Triage Vitals  Enc Vitals Group     BP 07/25/18 1213 (!) 148/69     Pulse Rate 07/25/18 1213 86     Resp 07/25/18 1213 18     Temp 07/25/18 1213 98.1 F (36.7 C)     Temp Source 07/25/18 1213 Oral     SpO2 07/25/18 1213 93 %     Weight 07/25/18 1215 222 lb 14.2 oz (101.1 kg)     Height 07/25/18 1215 5\' 7"  (1.702 m)     Head Circumference --      Peak Flow --      Pain Score 07/25/18 1214 0     Pain Loc --      Pain Edu? --      Excl. in [place]? --    Constitutional: Lethargic and  disoriented.  No acute distress Eyes: Conjunctivae are normal. Normal extraocular movements. ENT   Head: Normocephalic and atraumatic.   Nose: No congestion/rhinnorhea.   Mouth/Throat: Mucous membranes are moist.   Neck: No stridor. Cardiovascular: Normal rate, regular rhythm. No murmurs, rubs, or gallops. Respiratory: Normal respiratory effort without tachypnea nor retractions. Breath sounds are clear and equal bilaterally. No wheezes/rales/rhonchi. Gastrointestinal: Soft and nontender. Normal bowel sounds Musculoskeletal: Nontender with normal range of motion in extremities. No lower extremity tenderness nor edema. Neurologic:  No gross focal neurologic deficits are appreciated.  Generalized weakness, nothing focal Skin:  Skin is warm, dry  and intact. No rash noted. Psychiatric: Flat affect ____________________________________________  EKG: Interpreted by me.  Sinus rhythm rate 88 bpm, right bundle branch block, left anterior fascicular block, normal QT  ____________________________________________  ED COURSE:  As part of my medical decision making, I reviewed the following data within the [place] History obtained from family if available, nursing notes, old chart and ekg, as well as notes from prior ED visits. Patient presented for altered mental status and shortness of breath, we will assess with labs and imaging as indicated at this time.   Procedures ____________________________________________   LABS (pertinent positives/negatives)  Labs Reviewed  BLOOD GAS, VENOUS - Abnormal; Notable for the following components:      Result Value   pH, Ven 7.22 (*)    pCO2, Ven 103 (*)    pO2, Ven 48.0 (*)    Bicarbonate 42.2 (*)    Acid-Base Excess 9.6 (*)    All other components within normal limits  CBC WITH DIFFERENTIAL/PLATELET - Abnormal; Notable for the following components:   WBC 15.1 (*)    MCHC 29.0 (*)    Neutro Abs 12.9 (*)    Monocytes Absolute 1.2 (*)    Abs Immature Granulocytes 0.11 (*)    All other components within normal limits  BASIC METABOLIC PANEL - Abnormal; Notable for the following components:   Chloride 96 (*)    CO2 37 (*)    Glucose, Bld 338 (*)    BUN 24 (*)    Creatinine, Ser 1.25 (*)    Calcium 8.8 (*)    GFR calc non Af Amer 54 (*)    All other components within normal limits  TROPONIN I - Abnormal; Notable for the following components:   Troponin I 0.23 (*)    All other components within normal limits  BRAIN NATRIURETIC PEPTIDE   CRITICAL CARE Performed by: Laurence Aly   Total critical care time: 30 minutes  Critical care time was exclusive of separately billable procedures and treating other patients.  Critical care was necessary to treat or  prevent imminent or life-threatening deterioration.  Critical care was time spent personally by me on the following activities: development of treatment plan with patient and/or surrogate as well as nursing, discussions with consultants, evaluation of patient's response to treatment, examination of patient, obtaining history from patient or surrogate, ordering and performing treatments and interventions, ordering and review of laboratory studies, ordering and review of radiographic studies, pulse oximetry and re-evaluation of patient's condition.  RADIOLOGY Images were viewed by me  Chest x-ray IMPRESSION: Low lung volumes with patchy bibasilar opacity similar to the recent CTA on 07/21/2018 with small right pleural effusion. ____________________________________________  DIFFERENTIAL DIAGNOSIS   Hypercapnia, hypoxia, dehydration, electrolyte abnormality, pneumonia, CHF  FINAL ASSESSMENT AND PLAN  Altered mental status, dyspnea, acute respiratory failure with hypoxia and hypercapnia, elevated troponin  Plan: The patient had presented for altered mental status and dyspnea. Patient's labs did indicate acute hypercarbic respiratory failure with hypoxia.  PCO2 was 103 with a pH of 7.22.  He also appears somewhat dehydrated with hyperglycemia.  Troponin was 0.23 which is probably demand related, white count 15,000 which may reflect infection as well as recent steroids. Patient's imaging revealed patchy bibasilar opacities similar to recent CTA.  I will give IV Levaquin.  He has been placed on BiPAP for his acute respiratory failure and I think he will do well.  I will discuss with the hospitalist for admission.   Laurence Aly, MD   Note: This note was generated in part or whole with voice recognition software. Voice recognition is usually quite accurate but there are transcription errors that can and very often do occur. I apologize for any typographical errors that were not detected  and corrected.     Earleen Newport, MD 07/25/18 1346

## 2018-07-25 NOTE — Consult Note (Addendum)
Name: Michael Wright MRN: [id number] DOB: 02/11/1941     CONSULTATION DATE: 07/25/2018  HISTORY OF PRESENT ILLNESS:  77 years old man with a history of COPD, chronic respiratory failure on home O2 2 L nasal cannula at night, diabetes mellitus, hypertension, dyslipidemia, chronic kidney disease stage III, benign prostatic hyperplasia, gouty arthritis and dementia.  Patient presented to the ED because of altered mental status and low oxygen saturation. In the ED he was found to have a PCO2 of 103 pH 7.22 venous sample, he was placed on a BiPAP and intensive care unit admission was requested. All history was obtained from admitting physician, daughter at the bedside and EMR. Patient arrived to ICU on a BiPAP 12/6, awake and in no distress PAST MEDICAL HISTORY :   has a past medical history of Anemia, Arthritis, BPH (benign prostatic hyperplasia), Chronic diastolic (congestive) heart failure ([place]), Chronic kidney disease (CKD), stage III (moderate) ([place]), Cough, Dementia ([place]), Diabetes mellitus, type 2 ([place]), GERD (gastroesophageal reflux disease), Gout, Hyperlipidemia, Hypertension, Kidney stones, Neuropathy, and TIA (transient ischemic attack) (2016).  has a past surgical history that includes Tonsillectomy; Adenoidectomy (at age 6); Vesicostomy; and Shoulder surgery. Prior to Admission medications   Medication Sig Start Date End Date Taking? Authorizing Provider  allopurinol (ZYLOPRIM) 100 MG tablet Take 100 mg by mouth 2 (two) times daily.    Yes [provider]  aspirin EC 81 MG tablet Take 81 mg by mouth daily.   Yes [provider]  carvedilol (COREG) 3.125 MG tablet Take 3.125 mg by mouth 2 (two) times daily with a meal.   Yes [provider]  colchicine 0.6 MG tablet Take 0.6 mg by mouth 2 (two) times daily as needed (for gout flares).   Yes [provider]  Cyanocobalamin 3000 MCG SUBL Place 3,000 mcg under the tongue daily at 12 noon.   Yes  [provider]  donepezil (ARICEPT) 10 MG tablet Take 10 mg by mouth daily.  09/13/15 04/24/19 Yes [provider]  doxycycline (VIBRAMYCIN) 100 MG capsule Take 1 capsule (100 mg total) by mouth 2 (two) times daily. 07/21/18  Yes Carrie Mew, MD  finasteride (PROSCAR) 5 MG tablet Take 1 tablet (5 mg total) by mouth daily. 04/26/18  Yes Gladstone Lighter, MD  furosemide (LASIX) 40 MG tablet Take 1 tablet (40 mg total) by mouth daily. 04/26/18  Yes Gladstone Lighter, MD  HYDROcodone-homatropine [facility]) 5-1.5 MG/5ML syrup Take 3 mLs by mouth every 6 (six) hours as needed for cough. 07/21/18  Yes Carrie Mew, MD  insulin glargine (LANTUS) 100 unit/mL SOPN Inject 0.36 mLs (36 Units total) into the skin at bedtime. Patient taking differently: Inject 42 Units into the skin at bedtime.  04/26/18  Yes Gladstone Lighter, MD  ipratropium-albuterol (DUONEB) 0.5-2.5 (3) MG/3ML SOLN Take 3 mLs by nebulization every 6 (six) hours as needed (For wheezing and shortness of breath). 04/26/18  Yes Gladstone Lighter, MD  loratadine (CLARITIN) 10 MG tablet Take 10 mg by mouth daily.   Yes [provider]  OXYGEN Inhale into the lungs at bedtime as needed.   Yes [provider]  pantoprazole (PROTONIX) 40 MG tablet Take 1 tablet (40 mg total) by mouth daily. 04/26/18  Yes Gladstone Lighter, MD  potassium chloride 20 MEQ TBCR Take 20 mEq by mouth daily. While on lasix 04/26/18  Yes Gladstone Lighter, MD  pravastatin (PRAVACHOL) 40 MG tablet Take 40 mg by mouth at bedtime.    Yes [provider]  predniSONE (DELTASONE) 20 MG tablet Take 1 tablet (20 mg total) by mouth 2 (two) times daily with a meal. 07/21/18  Yes Carrie Mew, MD  traZODone (DESYREL) 50 MG tablet Take 1 tablet (50 mg total) by mouth at bedtime. 04/26/18  Yes Gladstone Lighter, MD  metFORMIN (GLUCOPHAGE-XR) 500 MG 24 hr tablet Take 500 mg by mouth 2 (two) times daily.    [provider]     Allergies  Allergen Reactions  . Tramadol Other (See Comments)    Light Headedness    FAMILY HISTORY:  family history includes Allergies in his daughter; Diabetes in his father. SOCIAL HISTORY:  reports that he has never smoked. He quit smokeless tobacco use about 39 years ago.  His smokeless tobacco use included chew. He reports that he does not drink alcohol or use drugs.  REVIEW OF SYSTEMS:   Unable to obtain due to critical illness   VITAL SIGNS: Temp:  [96.6 F (35.9 C)-98.1 F (36.7 C)] 96.6 F (35.9 C) (11/25 1704) Pulse Rate:  [77-95] 77 (11/25 1704) Resp:  [15-28] 15 (11/25 1704) BP: (132-175)/(69-80) 132/70 (11/25 1704) SpO2:  [92 %-99 %] 99 % (11/25 1704) Weight:  [101.1 kg-103.5 kg] 103.5 kg (11/25 1704)  Physical Examination:  Awake and oriented with no focal motor deficits Tolerating BiPAP, no distress, bilateral equal air entry with no adventitious sounds S1 & S2 are audible with no murmur Benign obese abdomen with normal peristalsis No leg edema  ASSESSMENT / PLAN:  Acute on chronic respiratory failure with baseline home O2 2 L nasal cannula only at night.  Tolerating BiPAP in no distress. -Monitor ABG and optimize BiPAP settings.  Consider intubation if no improvement  Altered mental status with hypercapnic respiratory failure and toxic metabolic encephalopathy.  Nonfocal neuro exam -Monitor neuro status and consider CT head if no improvement  COPD exacerbation.  Steroid dependent prednisone 20 mg twice a day -Bronchodilators + systemic steroids + ABX + inhaled steroid  Pneumonia and atelectasis.  Worsening bibasilar airspace disease with small left pleural effusion compared to chest x-ray on 07/21/2018, -Empiric levofloxacin.  Monitor procalcitonin -Monitor CXR + CBC + FiO2  AKI on chronic kidney disease stage III -Avoid nephrotoxins, optimize hydration, monitor renal panel and urine output  Mild elevation of troponin likely with demand versus  supply mismatch with baseline HFpEF.  No chest pain. echo 03/2018 LVEF 55 to 65%, normal wall motion and grade 1 diastolic dysfunction. -Blood pressure control, diuresis to decrease preload and monitor hemodynamics -Monitor EKG and cardiac enzymes  Diabetes mellitus -Glycemic control  Benign prostatic hyperplasia -Finasteride   Hypertension -Optimize antihypertensives and monitor hemodynamics  Dyslipidemia -Statin  Dementia -Aricept  Gouty arthritis -Allopurinol  Full code  DVT & GI prophylaxis.  Continue supportive care  Family (daughter) was updated at the bedside and agreed to the plan of care  Critical care time 45 minutes

## 2018-07-25 NOTE — ED Notes (Signed)
Nurse attempted to call report. Nurse unavailable at this time.

## 2018-07-26 ENCOUNTER — Inpatient Hospital Stay: Payer: Medicare HMO

## 2018-07-26 LAB — BASIC METABOLIC PANEL
Anion gap: 6 (ref 5–15)
BUN: 25 mg/dL — AB (ref 8–23)
CALCIUM: 8.7 mg/dL — AB (ref 8.9–10.3)
CO2: 37 mmol/L — ABNORMAL HIGH (ref 22–32)
Chloride: 100 mmol/L (ref 98–111)
Creatinine, Ser: 1.16 mg/dL (ref 0.61–1.24)
GFR calc Af Amer: >60 mL/min (ref >60)
GFR, EST NON AFRICAN AMERICAN: 59 mL/min — AB (ref >60)
GLUCOSE: 155 mg/dL — AB (ref 70–99)
Potassium: 3.9 mmol/L (ref 3.5–5.1)
Sodium: 143 mmol/L (ref 135–145)

## 2018-07-26 LAB — CBC
HCT: 42.8 % (ref 39.0–52.0)
Hemoglobin: 12.8 g/dL — ABNORMAL LOW (ref 13.0–17.0)
MCH: 28.3 pg (ref 26.0–34.0)
MCHC: 29.9 g/dL — AB (ref 30.0–36.0)
MCV: 94.5 fL (ref 80.0–100.0)
PLATELETS: 227 10*3/uL (ref 150–400)
RBC: 4.53 MIL/uL (ref 4.22–5.81)
RDW: 12.6 % (ref 11.5–15.5)
WBC: 12.4 10*3/uL — ABNORMAL HIGH (ref 4.0–10.5)
nRBC: 0 % (ref 0.0–0.2)

## 2018-07-26 LAB — TROPONIN I
Troponin I: 0.26 ng/mL (ref <0.03)
Troponin I: 0.19 ng/mL (ref <0.03)

## 2018-07-26 LAB — GLUCOSE, CAPILLARY
GLUCOSE-CAPILLARY: 181 mg/dL — AB (ref 70–99)
GLUCOSE-CAPILLARY: 212 mg/dL — AB (ref 70–99)
Glucose-Capillary: 134 mg/dL — ABNORMAL HIGH (ref 70–99)
Glucose-Capillary: 234 mg/dL — ABNORMAL HIGH (ref 70–99)

## 2018-07-26 MED ORDER — LEVOFLOXACIN IN D5W 750 MG/150ML IV SOLN
750.0000 mg | INTRAVENOUS | Status: DC
  Filled 2018-07-26: qty 150

## 2018-07-26 NOTE — Progress Notes (Signed)
Name: Michael Wright MRN: [id number] DOB: October 06, 1940     CONSULTATION DATE: 07/25/2018  Subjective & Objectives: Off BiPAP and tolerating [place]  PAST MEDICAL HISTORY :   has a past medical history of Anemia, Arthritis, BPH (benign prostatic hyperplasia), Chronic diastolic (congestive) heart failure ([place]), Chronic kidney disease (CKD), stage III (moderate) ([place]), Cough, Dementia ([place]), Diabetes mellitus, type 2 ([place]), GERD (gastroesophageal reflux disease), Gout, Hyperlipidemia, Hypertension, Kidney stones, Neuropathy, and TIA (transient ischemic attack) (2016).  has a past surgical history that includes Tonsillectomy; Adenoidectomy (at age 38); Vesicostomy; and Shoulder surgery. Prior to Admission medications   Medication Sig Start Date End Date Taking? Authorizing Provider  allopurinol (ZYLOPRIM) 100 MG tablet Take 100 mg by mouth 2 (two) times daily.    Yes [provider]  aspirin EC 81 MG tablet Take 81 mg by mouth daily.   Yes [provider]  carvedilol (COREG) 3.125 MG tablet Take 3.125 mg by mouth 2 (two) times daily with a meal.   Yes [provider]  colchicine 0.6 MG tablet Take 0.6 mg by mouth 2 (two) times daily as needed (for gout flares).   Yes [provider]  Cyanocobalamin 3000 MCG SUBL Place 3,000 mcg under the tongue daily at 12 noon.   Yes [provider]  donepezil (ARICEPT) 10 MG tablet Take 10 mg by mouth daily.  09/13/15 04/24/19 Yes [provider]  doxycycline (VIBRAMYCIN) 100 MG capsule Take 1 capsule (100 mg total) by mouth 2 (two) times daily. 07/21/18  Yes Carrie Mew, MD  finasteride (PROSCAR) 5 MG tablet Take 1 tablet (5 mg total) by mouth daily. 04/26/18  Yes Gladstone Lighter, MD  furosemide (LASIX) 40 MG tablet Take 1 tablet (40 mg total) by mouth daily. 04/26/18  Yes Gladstone Lighter, MD  HYDROcodone-homatropine [facility]) 5-1.5 MG/5ML syrup Take 3 mLs by mouth every 6 (six) hours as needed for  cough. 07/21/18  Yes Carrie Mew, MD  insulin glargine (LANTUS) 100 unit/mL SOPN Inject 0.36 mLs (36 Units total) into the skin at bedtime. Patient taking differently: Inject 42 Units into the skin at bedtime.  04/26/18  Yes Gladstone Lighter, MD  ipratropium-albuterol (DUONEB) 0.5-2.5 (3) MG/3ML SOLN Take 3 mLs by nebulization every 6 (six) hours as needed (For wheezing and shortness of breath). 04/26/18  Yes Gladstone Lighter, MD  loratadine (CLARITIN) 10 MG tablet Take 10 mg by mouth daily.   Yes [provider]  OXYGEN Inhale into the lungs at bedtime as needed.   Yes [provider]  pantoprazole (PROTONIX) 40 MG tablet Take 1 tablet (40 mg total) by mouth daily. 04/26/18  Yes Gladstone Lighter, MD  potassium chloride 20 MEQ TBCR Take 20 mEq by mouth daily. While on lasix 04/26/18  Yes Gladstone Lighter, MD  pravastatin (PRAVACHOL) 40 MG tablet Take 40 mg by mouth at bedtime.    Yes [provider]  predniSONE (DELTASONE) 20 MG tablet Take 1 tablet (20 mg total) by mouth 2 (two) times daily with a meal. 07/21/18  Yes Carrie Mew, MD  traZODone (DESYREL) 50 MG tablet Take 1 tablet (50 mg total) by mouth at bedtime. 04/26/18  Yes Gladstone Lighter, MD  metFORMIN (GLUCOPHAGE-XR) 500 MG 24 hr tablet Take 500 mg by mouth 2 (two) times daily.    [provider]   Allergies  Allergen Reactions  . Tramadol Other (See Comments)    Light Headedness    FAMILY HISTORY:  family history includes Allergies in his daughter; Diabetes  in his father. SOCIAL HISTORY:  reports that he has never smoked. He quit smokeless tobacco use about 39 years ago.  His smokeless tobacco use included chew. He reports that he does not drink alcohol or use drugs.  REVIEW OF SYSTEMS:   Unable to obtain due to critical illness   VITAL SIGNS: Temp:  [96.6 F (35.9 C)-98.7 F (37.1 C)] 97.9 F (36.6 C) (11/26 0800) Pulse Rate:  [65-92] 75 (11/26 1000) Resp:  [12-30] 26  (11/26 1000) BP: (111-149)/(63-98) 138/71 (11/26 1000) SpO2:  [92 %-100 %] 95 % (11/26 1000) Weight:  [103.5 kg] 103.5 kg (11/25 1704)  Physical Examination:  Awake and oriented with no focal motor deficits Tolerating [place], no distress, bilateral equal air entry with no adventitious sounds S1 & S2 are audible with no murmur Benign obese abdomen with normal peristalsis No leg edema  ASSESSMENT / PLAN:  Acute on chronic respiratory failure with baseline home O2 2 L nasal cannula only at night.  Tolerating [place]. -Monitor ABG and optimize BiPAP settings.  Consider intubation if no improvement  Altered mental status (resolved) with hypercapnic respiratory failure and toxic metabolic encephalopathy.  Nonfocal neuro exam -Monitor neuro status and consider CT head if no improvement  COPD exacerbation.  Steroid dependent prednisone 20 mg twice a day -Bronchodilators + systemic steroids + ABX + inhaled steroid  Pneumonia and atelectasis.  bibasilar airspace disease, worsening on the Lt.  with left pleural effusion. Procal. 0.12 -Empiric levofloxacin and optimize diuresis -Monitor CXR + CBC + FiO2  AKI (improved) on chronic kidney disease stage III -Avoid nephrotoxins, optimize hydration, monitor renal panel and urine output  Mild elevation of troponin likely with demand versus supply mismatch with baseline HFpEF.  No chest pain. echo 03/2018 LVEF 55 to 65%, normal wall motion and grade 1 diastolic dysfunction. -Blood pressure control, diuresis to decrease preload and monitor hemodynamics -Monitor EKG and cardiac enzymes  Diabetes mellitus -Glycemic control  Benign prostatic hyperplasia -Finasteride  Hypertension -Optimize antihypertensives and monitor hemodynamics  Dyslipidemia -Statin  Dementia -Aricept  Gouty arthritis -Allopurinol  Mild anemia -Keep HB > 7 gm/dl.  Full code  DVT & GI prophylaxis.  Continue supportive care  Family (daughter) was updated  at the bedside and agreed to the plan of care  Critical care time 35 minutes

## 2018-07-26 NOTE — Progress Notes (Deleted)
PICC line place today, asked MD to verify placement through CXR, ok to use if needed. RN will continue to monitor.

## 2018-07-26 NOTE — Progress Notes (Signed)
Inpatient Diabetes Program Recommendations  AACE/ADA: New Consensus Statement on Inpatient Glycemic Control (2019)  Target Ranges:  Prepandial:   less than 140 mg/dL      Peak postprandial:   less than 180 mg/dL (1-2 hours)      Critically ill patients:  140 - 180 mg/dL   Results for ERMON, [name] (MRN [id number]) as of 07/26/2018 08:16  Ref. Range 07/25/2018 16:58 07/25/2018 22:16 07/26/2018 07:14  Glucose-Capillary Latest Ref Range: 70 - 99 mg/dL 345 (H) 181 (H) 134 (H)  Results for LIVAN, HIRES (MRN [id number]) as of 07/26/2018 08:16  Ref. Range 04/19/2018 22:24 07/25/2018 17:41  Hemoglobin A1C Latest Ref Range: 4.8 - 5.6 % 10.7 (H) 10.1 (H)   Review of Glycemic Control  Diabetes history: DM2 Outpatient Diabetes medications: Lantus 42 units QHs, Metformin XR 500 mg BID Current orders for Inpatient glycemic control: Lantus 42 units QHS, Novolog 0-20 units TID with meals, Novolog 0-5 units QHS; Solumedrol 40 mg Q12H  Inpatient Diabetes Program Recommendations:  HgbA1C: A1C 10.1% on 07/25/18 indicating an average glucose of 243 mg/dl over the past 2-3 months. Insulin-Meal Coverage: If postprandial glucose is consistently greater than 180 mg/dl, please consider ordering Novolog 3 units TID with meals for meal coverage if patient eats at least 50% of meals.  NOTE: In reviewing chart, noted patient was seen by Diabetes Coordinator on 04/26/18 during prior hospital admission. Patient was discharged from the hospital on 04/26/18 new to insulin (Lantus 36 units QHS). Patient was educated on insulin while inpatient and A1C was noted to be 10.7% on 04/19/18. Current A1C 10.1% on 07/25/18 which is slightly improved from prior A1C. Noted Prednisone 20 mg BID on patient's home med list which is likely contributing to hyperglycemia and elevated A1C. Will continue to follow while inpatient.  Thanks, Michael Alderman, RN, MSN, CDE Diabetes Coordinator Inpatient Diabetes Program [phone]  (Team Pager from 8am to 5pm)

## 2018-07-26 NOTE — Progress Notes (Signed)
[facility] NAME: Michael Wright    MR#:  [id number]  DATE OF BIRTH:  August 06, 1941  SUBJECTIVE:  CHIEF COMPLAINT:   Continues to be encephalopathic, requiring BiPAP  REVIEW OF SYSTEMS:  CONSTITUTIONAL: No fever, fatigue or weakness.  EYES: No blurred or double vision.  EARS, NOSE, AND THROAT: No tinnitus or ear pain.  RESPIRATORY: No cough, shortness of breath, wheezing or hemoptysis.  CARDIOVASCULAR: No chest pain, orthopnea, edema.  GASTROINTESTINAL: No nausea, vomiting, diarrhea or abdominal pain.  GENITOURINARY: No dysuria, hematuria.  ENDOCRINE: No polyuria, nocturia,  HEMATOLOGY: No anemia, easy bruising or bleeding SKIN: No rash or lesion. MUSCULOSKELETAL: No joint pain or arthritis.   NEUROLOGIC: No tingling, numbness, weakness.  PSYCHIATRY: No anxiety or depression.   ROS  DRUG ALLERGIES:   Allergies  Allergen Reactions  . Tramadol Other (See Comments)    Light Headedness    VITALS:  Blood pressure 138/71, pulse 75, temperature 97.9 F (36.6 C), temperature source Oral, resp. rate (!) 26, height 5\' 7"  (1.702 m), weight 103.5 kg, SpO2 95 %.  PHYSICAL EXAMINATION:  GENERAL:  77 y.o.-year-old patient lying in the bed with no acute distress.  EYES: Pupils equal, round, reactive to light and accommodation. No scleral icterus. Extraocular muscles intact.  HEENT: Head atraumatic, normocephalic. Oropharynx and nasopharynx clear.  NECK:  Supple, no jugular venous distention. No thyroid enlargement, no tenderness.  LUNGS: Normal breath sounds bilaterally, no wheezing, rales,rhonchi or crepitation. No use of accessory muscles of respiration.  CARDIOVASCULAR: S1, S2 normal. No murmurs, rubs, or gallops.  ABDOMEN: Soft, nontender, nondistended. Bowel sounds present. No organomegaly or mass.  EXTREMITIES: No pedal edema, cyanosis, or clubbing.  NEUROLOGIC: Cranial nerves II through XII are intact. Muscle strength 5/5 in  all extremities. Sensation intact. Gait not checked.  PSYCHIATRIC: The patient is alert and oriented x 3.  SKIN: No obvious rash, lesion, or ulcer.   Physical Exam LABORATORY PANEL:   CBC Recent Labs  Lab 07/26/18 0325  WBC 12.4*  HGB 12.8*  HCT 42.8  PLT 227   ------------------------------------------------------------------------------------------------------------------  Chemistries  Recent Labs  Lab 07/26/18 0325  NA 143  K 3.9  CL 100  CO2 37*  GLUCOSE 155*  BUN 25*  CREATININE 1.16  CALCIUM 8.7*   ------------------------------------------------------------------------------------------------------------------  Cardiac Enzymes Recent Labs  Lab 07/26/18 0325 07/26/18 0900  TROPONINI 0.26* 0.19*   ------------------------------------------------------------------------------------------------------------------  RADIOLOGY:  Dg Chest Port 1 View  Result Date: 07/26/2018 CLINICAL DATA:  Follow-up pneumonia EXAM: PORTABLE CHEST 1 VIEW COMPARISON:  07/25/2018 FINDINGS: Cardiac shadow is stable. Stable linear opacity is noted in the right lung base. Increasing infiltrate is noted in the left lung base when compare with the prior exam with associated enlarging effusion. No pneumothorax is noted. No bony abnormality is seen. IMPRESSION: Increasing left basilar infiltrate with associated effusion. Stable opacity in the right base. Electronically Signed   By: Inez Catalina M.D.   On: 07/26/2018 07:42   Dg Chest Port 1 View  Result Date: 07/25/2018 CLINICAL DATA:  77 year old male with shortness of breath. EXAM: PORTABLE CHEST 1 VIEW COMPARISON:  Chest CTA 07/21/2018 and earlier. FINDINGS: Portable AP upright view at 1303 hours. Low lung volumes. Patchy opacity at both lung bases appears similar to the appearance on the recent CTA with small volume right pleural effusion suspected and fluid may now be tracking more into the right minor fissure. Calcified granuloma  redemonstrated at the left lung base. Stable  cardiac size and mediastinal contours. Visualized tracheal air column is within normal limits. No pneumothorax. Pulmonary vascularity is stable. IMPRESSION: Low lung volumes with patchy bibasilar opacity similar to the recent CTA on 07/21/2018 with small right pleural effusion. Electronically Signed   By: Genevie Ann M.D.   On: 07/25/2018 13:28    ASSESSMENT AND PLAN:  *Acute on chronic hypercarbic hypoxic respiratory failure Stable Secondary to COPD exacerbation and pneumonia Continue ICU care, BiPAP/supplemental oxygen with weaning as tolerated  *Acute on COPD exacerbation Secondary to pneumonia Stable Continue pneumonia protocol, IV Solu-Medrol with tapering as tolerated, mucolytic agents, aggressive pulmonary toilet bronchodilator therapy, follow-up on cultures, empiric Levaquin  *Acute community-acquired pneumonia Stable Continue pneumonia protocol, antibiotics per above, follow-up on cultures  *Chronic diabetes mellitus type 2 Stable on current regiment  *Chronic congestive heart failure without exacerbation Continue Lasix, strict I&O monitoring, daily weights  *Acute elevated troponins Most likely secondary to demand ischemia Most recent echocardiogram noted for preserved left ventricular systolic function/ejection fraction, grade 1 diastolic dysfunction  *Chronic gout without exacerbation Stable Continue allopurinol  *Chronic BPH Stable  *Chronic kidney disease stage III Secondary to diabetes Avoid nephrotoxic agents, strict I&O monitoring, daily weights  Disposition pending clinical course  All the records are reviewed and case discussed with Care Management/Social Workerr. Management plans discussed with the patient, family and they are in agreement.  CODE STATUS: full  TOTAL TIME TAKING CARE OF THIS PATIENT: 35 minutes.     POSSIBLE D/C IN 2-4 DAYS, DEPENDING ON CLINICAL CONDITION.   Avel Peace Salary M.D on  07/26/2018   Between 7am to 6pm - Pager - [phone]  After 6pm go to [email or web address] - password [email or web address] [facility]  Office  [phone]  CC: Primary care physician; [place] Pink, MD  Note: This dictation was prepared with Dragon dictation along with smaller phrase technology. Any transcriptional errors that result from this process are unintentional.

## 2018-07-26 NOTE — Care Management Note (Signed)
Case Management Note  Patient Details  Name: [name] MRN: [id number] Date of Birth: [birth year]  Subjective/Objective:     Patient is sitting up on the side of the bed and daughter [name] is at the bedside.  [name] reports that her father has some dementia and she answers most questions for him but he is able to participate in the conversation.  [place] lives [place] in [place], [place] lives close by and the patient has 2 other daughter's one in [place] and one in [place].  [name] is the primary caregiver.  Patient has a walker and cane at home, has been using the walker.  Patient states he has a wheelchair but doesn't use it.  Per daughter the patient only wears oxygen at night usually 3 liters but has been requiring it during the day more and more.  Patient and daughter are very interested in the NIV.  Daughter is worried about her father living alone, RNCM will cont to follow to address discharge needs and evaluate for [place].  Daughter is okay with [facility] providing equipment but does not want them for Home Health services.  List of Medicare approved agencies given to daughter to look over for home health services.    [name] RN BSN [phone]                 Action/Plan:   Expected Discharge Date:                  Expected Discharge Plan:  [place]  In-House Referral:     Discharge planning Services  CM Consult  Post Acute Care Choice:    Choice offered to:     DME Arranged:    DME Agency:     HH Arranged:    [place] Agency:     Status of Service:  In process, will continue to follow  If discussed at Long Length of Stay Meetings, dates discussed:    Additional Comments:  [name], RN 07/26/2018, 2:58 PM

## 2018-07-26 NOTE — Care Management Note (Signed)
Case Management Note  Patient Details  Name: Michael Wright MRN: [id number] Date of Birth: 1941/01/14  Subjective/Objective:      Patient admitted with acute respiratory failure needing Bipap.  Patient is currently on [name] at 4L which is her baseline at home.  Patient is in the ICU waiting for a med-surg bed.  RNCM consult received for NIV.  RNCM contacted [name] with [facility] 11/25 and he placed an order in the patient's chart.  Attending MD contacted for approval and signature or NIV.   [name] RN BSN  [phone]                 Action/Plan:   Expected Discharge Date:                  Expected Discharge Plan:     In-House Referral:     Discharge planning Services     Post Acute Care Choice:    Choice offered to:     DME Arranged:    DME Agency:     HH Arranged:    HH Agency:     Status of Service:     If discussed at [place] of Stay Meetings, dates discussed:    Additional Comments:  [name], RN 07/26/2018, 2:31 PM

## 2018-07-27 ENCOUNTER — Inpatient Hospital Stay: Payer: Medicare HMO

## 2018-07-27 LAB — CBC WITH DIFFERENTIAL/PLATELET
ABS IMMATURE GRANULOCYTES: 0.09 10*3/uL — AB (ref 0.00–0.07)
BASOS ABS: 0 10*3/uL (ref 0.0–0.1)
Basophils Relative: 0 %
Eosinophils Absolute: 0 10*3/uL (ref 0.0–0.5)
Eosinophils Relative: 0 %
HEMATOCRIT: 43.9 % (ref 39.0–52.0)
HEMOGLOBIN: 13.3 g/dL (ref 13.0–17.0)
IMMATURE GRANULOCYTES: 1 %
LYMPHS ABS: 1.3 10*3/uL (ref 0.7–4.0)
LYMPHS PCT: 9 %
MCH: 27.8 pg (ref 26.0–34.0)
MCHC: 30.3 g/dL (ref 30.0–36.0)
MCV: 91.8 fL (ref 80.0–100.0)
MONOS PCT: 9 %
Monocytes Absolute: 1.4 10*3/uL — ABNORMAL HIGH (ref 0.1–1.0)
NEUTROS PCT: 81 %
NRBC: 0 % (ref 0.0–0.2)
Neutro Abs: 12.6 10*3/uL — ABNORMAL HIGH (ref 1.7–7.7)
Platelets: 245 10*3/uL (ref 150–400)
RBC: 4.78 MIL/uL (ref 4.22–5.81)
RDW: 12.2 % (ref 11.5–15.5)
WBC: 15.4 10*3/uL — ABNORMAL HIGH (ref 4.0–10.5)

## 2018-07-27 LAB — BASIC METABOLIC PANEL
ANION GAP: 8 (ref 5–15)
BUN: 33 mg/dL — ABNORMAL HIGH (ref 8–23)
CHLORIDE: 94 mmol/L — AB (ref 98–111)
CO2: 40 mmol/L — AB (ref 22–32)
Calcium: 8.9 mg/dL (ref 8.9–10.3)
Creatinine, Ser: 1.42 mg/dL — ABNORMAL HIGH (ref 0.61–1.24)
GFR calc non Af Amer: 48 mL/min — ABNORMAL LOW (ref >60)
GFR, EST AFRICAN AMERICAN: 55 mL/min — AB (ref >60)
Glucose, Bld: 149 mg/dL — ABNORMAL HIGH (ref 70–99)
Potassium: 3.8 mmol/L (ref 3.5–5.1)
Sodium: 142 mmol/L (ref 135–145)

## 2018-07-27 LAB — GLUCOSE, CAPILLARY
GLUCOSE-CAPILLARY: 140 mg/dL — AB (ref 70–99)
GLUCOSE-CAPILLARY: 240 mg/dL — AB (ref 70–99)

## 2018-07-27 MED ORDER — PREDNISONE 10 MG PO TABS: ORAL_TABLET | ORAL | 0 refills | Status: DC

## 2018-07-27 MED ORDER — LEVOFLOXACIN 750 MG PO TABS: 750.0000 mg | ORAL_TABLET | Freq: Every day | ORAL | Status: DC

## 2018-07-27 MED ORDER — LEVOFLOXACIN 750 MG PO TABS: 750.0000 mg | ORAL_TABLET | Freq: Every day | ORAL | 0 refills | Status: AC

## 2018-07-27 NOTE — Progress Notes (Signed)
PHARMACIST - PHYSICIAN COMMUNICATION DR:   Verdell Carmine CONCERNING: Antibiotic IV to Oral Route Change Policy  RECOMMENDATION: This patient is receiving levofloxacin by the intravenous route.  Based on criteria approved by the Pharmacy and Therapeutics Committee, the antibiotic(s) is/are being converted to the equivalent oral dose form(s).  The order was entered without stop date. I added 3 more doses to complete 5 day course for CAP. If additional doses are needed please modify the order. Without modification the last dose of levofloxacin PO will be 07/29/18 at 18:00.   DESCRIPTION: These criteria include:  Patient being treated for a respiratory tract infection, urinary tract infection, cellulitis or clostridium difficile associated diarrhea if on metronidazole  The patient is not neutropenic and does not exhibit a GI malabsorption state  The patient is eating (either orally or via tube) and/or has been taking other orally administered medications for a least 24 hours  The patient is improving clinically and has a Tmax < 100.5  If you have questions about this conversion, please contact the Pharmacy Department  []   [phone] )  [facility] [x]   [phone] )  [facility] []   [phone] )  [facility] []   [phone] )  [facility] []   [phone] )  [facility]

## 2018-07-27 NOTE — Progress Notes (Signed)
Inpatient Diabetes Program Recommendations  AACE/ADA: New Consensus Statement on Inpatient Glycemic Control (2019)  Target Ranges:  Prepandial:   less than 140 mg/dL      [place] postprandial:   less than 180 mg/dL (1-2 hours)      Critically ill patients:  140 - 180 mg/dL  Results for Michael Wright, Michael Wright (MRN [id number]) as of 07/27/2018 09:24  Ref. Range 07/26/2018 07:14 07/26/2018 11:37 07/26/2018 16:06 07/26/2018 20:53 07/27/2018 07:35  Glucose-Capillary Latest Ref Range: 70 - 99 mg/dL 134 (H) 181 (H) 234 (H) 212 (H) 140 (H)   Results for Michael Wright, Michael Wright (MRN [id number]) as of 07/26/2018 08:16  Ref. Range 07/25/2018 16:58 07/25/2018 22:16  Glucose-Capillary Latest Ref Range: 70 - 99 mg/dL 345 (H) 181 (H)  Results for Michael Wright, Michael Wright (MRN [id number]) as of 07/26/2018 08:16  Ref. Range 04/19/2018 22:24 07/25/2018 17:41  Hemoglobin A1C Latest Ref Range: 4.8 - 5.6 % 10.7 (H) 10.1 (H)   Review of Glycemic Control  Diabetes history: DM2 Outpatient Diabetes medications: Lantus 42 units QHs, Metformin XR 500 mg BID Current orders for Inpatient glycemic control: Lantus 42 units QHS, Novolog 0-20 units TID with meals, Novolog 0-5 units QHS; Solumedrol 40 mg Q12H  Inpatient Diabetes Program Recommendations:   Insulin-Meal Coverage: Post prandial glucose is consistently elevated. Please consider ordering Novolog 3 units TID with meals for meal coverage if patient eats at least 50% of meals.  HgbA1C: A1C 10.1% on 07/25/18 indicating an average glucose of 243 mg/dl over the past 2-3 months.  NOTE: In reviewing chart, noted patient was seen by Diabetes Coordinator on 04/26/18 during prior hospital admission. Patient was discharged from the hospital on 04/26/18 new to insulin (Lantus 36 units QHS). Patient was educated on insulin while inpatient and A1C was noted to be 10.7% on 04/19/18. Current A1C 10.1% on 07/25/18 which is slightly improved from prior A1C. Noted Prednisone 20 mg BID on  patient's home med list which is likely contributing to hyperglycemia and elevated A1C. Will continue to follow while inpatient.  Thanks, Barnie Alderman, RN, MSN, CDE Diabetes Coordinator Inpatient Diabetes Program [phone] (Team Pager from 8am to 5pm)

## 2018-07-27 NOTE — Progress Notes (Signed)
SATURATION QUALIFICATIONS: (This note is used to comply with regulatory documentation for home oxygen)  Patient Saturations on Room Air at Rest = 86%  Patient Saturations on Room Air while Ambulating = n/a%  Patient Saturations on 4 Liters of oxygen while Ambulating = 93%  Please briefly explain why patient needs home oxygen:n/a

## 2018-07-27 NOTE — Discharge Summary (Addendum)
[facility] NAME: Michael Wright    MR#:  [id number]  DATE OF BIRTH:  1941/06/25  DATE OF ADMISSION:  07/25/2018 ADMITTING PHYSICIAN: Loletha Grayer, MD  DATE OF DISCHARGE: 07/27/2018  PRIMARY CARE PHYSICIAN: Maryland Pink, MD    ADMISSION DIAGNOSIS:  Elevated troponin I level [R79.89] Acute respiratory failure with hypoxia and hypercapnia (HCC) [J96.01, J96.02]  DISCHARGE DIAGNOSIS:  Active Problems:   Acute respiratory failure with hypoxia and hypercarbia ([place])   SECONDARY DIAGNOSIS:   Past Medical History:  Diagnosis Date  . Anemia   . Arthritis    shoulders, knees, lower spine  . BPH (benign prostatic hyperplasia)   . Chronic diastolic (congestive) heart failure ([place])   . Chronic kidney disease (CKD), stage III (moderate) (HCC)   . Cough   . Dementia ([place])   . Diabetes mellitus, type 2 ([place])   . GERD (gastroesophageal reflux disease)   . Gout   . Hyperlipidemia   . Hypertension   . Kidney stones   . Neuropathy    feet  . TIA (transient ischemic attack) 2016   "several"    HOSPITAL COURSE:   77 year old male with history of COPD, chronic diastolic CHF, BPH, hypertension, hyperlipidemia, history of nephrolithiasis, previous history of TIA who presented to the hospital due to shortness of breath.  1.  Acute on chronic respiratory failure with hypoxia and hypercapnia- this was secondary to COPD exacerbation. -Patient was admitted to the hospital placed on BiPAP, treated for COPD with IV steroids, scheduled duo nebs, Pulmicort nebs and empiric antibiotics.   -Patient has improved and his wheezing and bronchospasm has significantly improved 2.  He is now being discharged on oral prednisone, empiric Levaquin, duo nebs as needed. - Patient will also need home oxygen upon discharge which is being arranged for him.   2.  COPD exacerbation-this is a cause of patient's worsening respiratory failure with hypoxia and  hypercapnia. - As mentioned above patient was treated with BiPAP support, IV steroids, scheduled duo nebs, Pulmicort nebs.  He has improved.  He is now being discharged on oral prednisone taper, empiric Levaquin, home oxygen, duo nebs as needed.  3.  Essential hypertension-patient will continue his carvedilol  4.  History of gout-no acute attack.  Patient will continue his allopurinol, colchicine.  5.  Diabetes type 2 without complication-patient will resume his Lantus, metformin upon discharge. -While in the hospital patient was on sliding scale insulin and Lantus.  6.  BPH-patient will continue his finasteride, no urinary retention.  Patient is being discharged home with home health physical therapy, nursing, aide, social work.  DISCHARGE CONDITIONS:   Stable  CONSULTS OBTAINED:    DRUG ALLERGIES:   Allergies  Allergen Reactions  . Tramadol Other (See Comments)    Light Headedness    DISCHARGE MEDICATIONS:   Allergies as of 07/27/2018      Reactions   Tramadol Other (See Comments)   Light Headedness      Medication List    STOP taking these medications   doxycycline 100 MG capsule Commonly known as:  VIBRAMYCIN     TAKE these medications   allopurinol 100 MG tablet Commonly known as:  ZYLOPRIM Take 100 mg by mouth 2 (two) times daily.   aspirin EC 81 MG tablet Take 81 mg by mouth daily.   carvedilol 3.125 MG tablet Commonly known as:  COREG Take 3.125 mg by mouth 2 (two) times daily with a meal.  colchicine 0.6 MG tablet Take 0.6 mg by mouth 2 (two) times daily as needed (for gout flares).   Cyanocobalamin 3000 MCG Subl Place 3,000 mcg under the tongue daily at 12 noon.   donepezil 10 MG tablet Commonly known as:  ARICEPT Take 10 mg by mouth daily.   finasteride 5 MG tablet Commonly known as:  PROSCAR Take 1 tablet (5 mg total) by mouth daily.   furosemide 40 MG tablet Commonly known as:  LASIX Take 1 tablet (40 mg total) by mouth  daily. What changed:  how much to take   HYDROcodone-homatropine 5-1.5 MG/5ML syrup Commonly known as:  HYCODAN Take 3 mLs by mouth every 6 (six) hours as needed for cough.   insulin glargine 100 unit/mL Sopn Commonly known as:  LANTUS Inject 0.36 mLs (36 Units total) into the skin at bedtime. What changed:  how much to take   ipratropium-albuterol 0.5-2.5 (3) MG/3ML Soln Commonly known as:  DUONEB Take 3 mLs by nebulization every 6 (six) hours as needed (For wheezing and shortness of breath).   levofloxacin 750 MG tablet Commonly known as:  LEVAQUIN Take 1 tablet (750 mg total) by mouth daily for 5 days.   loratadine 10 MG tablet Commonly known as:  CLARITIN Take 10 mg by mouth daily.   metFORMIN 500 MG 24 hr tablet Commonly known as:  GLUCOPHAGE-XR Take 500 mg by mouth 2 (two) times daily.   OXYGEN Inhale into the lungs at bedtime as needed.   pantoprazole 40 MG tablet Commonly known as:  PROTONIX Take 1 tablet (40 mg total) by mouth daily.   Potassium Chloride ER 20 MEQ Tbcr Take 20 mEq by mouth daily. While on lasix   pravastatin 40 MG tablet Commonly known as:  PRAVACHOL Take 40 mg by mouth at bedtime.   predniSONE 10 MG tablet Commonly known as:  DELTASONE Label  & dispense according to the schedule below. 5 Pills PO for 1 day then, 4 Pills PO for 1 day, 3 Pills PO for 1 day, 2 Pills PO for 1 day, 1 Pill PO for 1 days then STOP. What changed:    medication strength  how much to take  how to take this  when to take this  additional instructions   traZODone 50 MG tablet Commonly known as:  DESYREL Take 1 tablet (50 mg total) by mouth at bedtime.         DISCHARGE INSTRUCTIONS:   DIET:  Cardiac diet and Diabetic diet  DISCHARGE CONDITION:  Stable  ACTIVITY:  Activity as tolerated  OXYGEN:  Home Oxygen: Yes.     Oxygen Delivery: 2 liters/min via Patient connected to nasal cannula oxygen  DISCHARGE LOCATION:  Home with Home Health  PT, RN, Aide, Social Work.    If you experience worsening of your admission symptoms, develop shortness of breath, life threatening emergency, suicidal or homicidal thoughts you must seek medical attention immediately by calling 911 or calling your MD immediately  if symptoms less severe.  You Must read complete instructions/literature along with all the possible adverse reactions/side effects for all the Medicines you take and that have been prescribed to you. Take any new Medicines after you have completely understood and accpet all the possible adverse reactions/side effects.   Please note  You were cared for by a hospitalist during your hospital stay. If you have any questions about your discharge medications or the care you received while you were in the hospital after you are discharged, you  can call the unit and asked to speak with the hospitalist on call if the hospitalist that took care of you is not available. Once you are discharged, your primary care physician will handle any further medical issues. Please note that NO REFILLS for any discharge medications will be authorized once you are discharged, as it is imperative that you return to your primary care physician (or establish a relationship with a primary care physician if you do not have one) for your aftercare needs so that they can reassess your need for medications and monitor your lab values.     Today   Shortness of breath, wheezing much improved. Pt. Is sitting up in bed in NAD.    VITAL SIGNS:  Blood pressure 135/61, pulse 74, temperature 98.7 F (37.1 C), temperature source Oral, resp. rate 20, height 5\' 7"  (1.702 m), weight 103.5 kg, SpO2 94 %.  I/O:    Intake/Output Summary (Last 24 hours) at 07/27/2018 1422 Last data filed at 07/27/2018 1417 Gross per 24 hour  Intake 678.61 ml  Output 2550 ml  Net -1871.39 ml    PHYSICAL EXAMINATION:  GENERAL:  77 y.o.-year-old patient lying in the bed with no acute  distress.  EYES: Pupils equal, round, reactive to light and accommodation. No scleral icterus. Extraocular muscles intact.  HEENT: Head atraumatic, normocephalic. Oropharynx and nasopharynx clear.  NECK:  Supple, no jugular venous distention. No thyroid enlargement, no tenderness.  LUNGS: Normal breath sounds bilaterally, no wheezing, rales,rhonchi. No use of accessory muscles of respiration.  CARDIOVASCULAR: S1, S2 normal. No murmurs, rubs, or gallops.  ABDOMEN: Soft, non-tender, non-distended. Bowel sounds present. No organomegaly or mass.  EXTREMITIES: +1 pedal edema b/l, No cyanosis, or clubbing.  NEUROLOGIC: Cranial nerves II through XII are intact. No focal motor or sensory defecits b/l.  PSYCHIATRIC: The patient is alert and oriented x 3. Good affect.  SKIN: No obvious rash, lesion, or ulcer.   DATA REVIEW:   CBC Recent Labs  Lab 07/27/18 0321  WBC 15.4*  HGB 13.3  HCT 43.9  PLT 245    Chemistries  Recent Labs  Lab 07/27/18 0321  NA 142  K 3.8  CL 94*  CO2 40*  GLUCOSE 149*  BUN 33*  CREATININE 1.42*  CALCIUM 8.9    Cardiac Enzymes Recent Labs  Lab 07/26/18 0900  TROPONINI 0.19*    Microbiology Results  Results for orders placed or performed during the hospital encounter of 07/25/18  MRSA PCR Screening     Status: None   Collection Time: 07/25/18  5:07 PM  Result Value Ref Range Status   MRSA by PCR NEGATIVE NEGATIVE Final    Comment:        The GeneXpert MRSA Assay (FDA approved for NASAL specimens only), is one component of a comprehensive MRSA colonization surveillance program. It is not intended to diagnose MRSA infection nor to guide or monitor treatment for MRSA infections. Performed at [facility], [address] [place], [place] [address]     RADIOLOGY:  Dg Chest Port 1 View  Result Date: 07/27/2018 CLINICAL DATA:  Follow-up pleural effusion EXAM: PORTABLE CHEST 1 VIEW COMPARISON:  07/26/2018 FINDINGS: Cardiac shadow is  enlarged. The previously seen left effusion and left basilar infiltrative changes have improved when compared with the prior exam. The effusion is likely in part related to more upright positioning. The previously seen infiltrate has resolved. Mild right basilar atelectasis is again noted and stable. No new focal infiltrate or effusion is  noted. IMPRESSION: Decrease in size of left-sided pleural effusion. This may be in part due to patient positioning. Improved aeration in the left base is noted. Electronically Signed   By: Inez Catalina M.D.   On: 07/27/2018 07:19   Dg Chest Port 1 View  Result Date: 07/26/2018 CLINICAL DATA:  Follow-up pneumonia EXAM: PORTABLE CHEST 1 VIEW COMPARISON:  07/25/2018 FINDINGS: Cardiac shadow is stable. Stable linear opacity is noted in the right lung base. Increasing infiltrate is noted in the left lung base when compare with the prior exam with associated enlarging effusion. No pneumothorax is noted. No bony abnormality is seen. IMPRESSION: Increasing left basilar infiltrate with associated effusion. Stable opacity in the right base. Electronically Signed   By: Inez Catalina M.D.   On: 07/26/2018 07:42      Management plans discussed with the patient, family and they are in agreement.  CODE STATUS:     Code Status Orders  (From admission, onward)         Start     Ordered   07/25/18 1435  Full code  Continuous     07/25/18 1435        TOTAL TIME TAKING CARE OF THIS PATIENT: 45 minutes.    Henreitta Leber M.D on 07/27/2018 at 2:22 PM  Between 7am to 6pm - Pager - [phone]  After 6pm go to [email or web address] - Proofreader  [facility]  Office  [phone]  CC: Primary care physician; Maryland Pink, MD

## 2018-07-27 NOTE — Plan of Care (Signed)

## 2018-07-27 NOTE — Care Management Important Message (Signed)
Copy of signed IM left with patient in room.  

## 2018-07-27 NOTE — Care Management Note (Signed)
Case Management Note  Patient Details  Name: Michael Wright MRN: [id number] Date of Birth: [birth year]  Subjective/Objective:   Spoke with patient and daughter.  Patient would like home health services at discharge.  After offering choice, referral made to [facility] services for RN, PT, Aide, Respiratory protocol.  Will let them know when patient discharges.  Notified Dr. Verdell Carmine of NIV order to be signed on chart.  [facility] referral made as well.  He is current with PCP and uses a walker at home.  Has O2 at home but not sure it is working correctly.  Will notify [facility].                  Action/Plan:   Expected Discharge Date:                  Expected Discharge Plan:  [place]  In-House Referral:     Discharge planning Services  CM Consult  Post Acute Care Choice:  Home Health Choice offered to:  Patient  DME Arranged:    DME Agency:     HH Arranged:  RN, PT, Nurse's Aide, Social Work [employer] Agency:  [facility]  Status of Service:  In process, will continue to follow  If discussed at Long Length of Stay Meetings, dates discussed:    Additional Comments:  Elza Rafter, RN 07/27/2018, 10:38 AM

## 2018-07-27 NOTE — Care Management Note (Signed)
Case Management Note  Patient Details  Name: [name] MRN: [id number] Date of Birth: 02-May-1941  Subjective/Objective:    Patient is discharging today with daughter.  Portable O2 has been delivered to bedside for discharge. Patient did not qualify for NIV with new ABG results.  PCO2 is too low at 50.  Encouraging him to follow up with PCP as he is a prime candidate for NIV.   Notified Dr. Verdell Carmine and he is okay with patient discharging today with O2.              Action/Plan:   Expected Discharge Date:  07/27/18               Expected Discharge Plan:  [place]  In-House Referral:     Discharge planning Services  CM Consult  Post Acute Care Choice:  Home Health Choice offered to:  Patient  DME Arranged:  Oxygen DME Agency:  [place]:  RN, PT, Nurse's Aide, Social Work [facility] Agency:  [facility]  Status of Service:  Completed, signed off  If discussed at [employer], dates discussed:    Additional Comments:  Elza Rafter, RN 07/27/2018, 2:57 PM

## 2018-07-27 NOTE — Progress Notes (Signed)
Jerol L Biello to be D/C'd Home with homehealth per MD order.  Discussed prescriptions and follow up appointments with the patient and daughter Raymont Andreoni. Prescriptions given to patient, medication list explained in detail. Pt verbalized understanding.  Allergies as of 07/27/2018      Reactions   Tramadol Other (See Comments)   Light Headedness      Medication List    STOP taking these medications   doxycycline 100 MG capsule Commonly known as:  VIBRAMYCIN     TAKE these medications   allopurinol 100 MG tablet Commonly known as:  ZYLOPRIM Take 100 mg by mouth 2 (two) times daily.   aspirin EC 81 MG tablet Take 81 mg by mouth daily.   carvedilol 3.125 MG tablet Commonly known as:  COREG Take 3.125 mg by mouth 2 (two) times daily with a meal.   colchicine 0.6 MG tablet Take 0.6 mg by mouth 2 (two) times daily as needed (for gout flares).   Cyanocobalamin 3000 MCG Subl Place 3,000 mcg under the tongue daily at 12 noon.   donepezil 10 MG tablet Commonly known as:  ARICEPT Take 10 mg by mouth daily.   finasteride 5 MG tablet Commonly known as:  PROSCAR Take 1 tablet (5 mg total) by mouth daily.   furosemide 40 MG tablet Commonly known as:  LASIX Take 1 tablet (40 mg total) by mouth daily. What changed:  how much to take   HYDROcodone-homatropine 5-1.5 MG/5ML syrup Commonly known as:  HYCODAN Take 3 mLs by mouth every 6 (six) hours as needed for cough.   insulin glargine 100 unit/mL Sopn Commonly known as:  LANTUS Inject 0.36 mLs (36 Units total) into the skin at bedtime. What changed:  how much to take   ipratropium-albuterol 0.5-2.5 (3) MG/3ML Soln Commonly known as:  DUONEB Take 3 mLs by nebulization every 6 (six) hours as needed (For wheezing and shortness of breath).   levofloxacin 750 MG tablet Commonly known as:  LEVAQUIN Take 1 tablet (750 mg total) by mouth daily for 5 days.   loratadine 10 MG tablet Commonly known as:  CLARITIN Take 10  mg by mouth daily.   metFORMIN 500 MG 24 hr tablet Commonly known as:  GLUCOPHAGE-XR Take 500 mg by mouth 2 (two) times daily.   OXYGEN Inhale into the lungs at bedtime as needed.   pantoprazole 40 MG tablet Commonly known as:  PROTONIX Take 1 tablet (40 mg total) by mouth daily.   Potassium Chloride ER 20 MEQ Tbcr Take 20 mEq by mouth daily. While on lasix   pravastatin 40 MG tablet Commonly known as:  PRAVACHOL Take 40 mg by mouth at bedtime.   predniSONE 10 MG tablet Commonly known as:  DELTASONE Label  & dispense according to the schedule below. 5 Pills PO for 1 day then, 4 Pills PO for 1 day, 3 Pills PO for 1 day, 2 Pills PO for 1 day, 1 Pill PO for 1 days then STOP. What changed:    medication strength  how much to take  how to take this  when to take this  additional instructions   traZODone 50 MG tablet Commonly known as:  DESYREL Take 1 tablet (50 mg total) by mouth at bedtime.            Durable Medical Equipment  (From admission, onward)         Start     Ordered   07/27/18 1505  For home use only DME  oxygen  Once    Question Answer Comment  Mode or (Route) Nasal cannula   Liters per Minute 2   Frequency Continuous (stationary and portable oxygen unit needed)   Oxygen conserving device Yes   Oxygen delivery system Gas      07/27/18 1504          Vitals:   07/27/18 0738 07/27/18 1426  BP: 135/61 (!) 129/54  Pulse: 74 80  Resp: 20 18  Temp: 98.7 F (37.1 C) 98.7 F (37.1 C)  SpO2: 94% 90%    Tele box removed and returned. Skin clean, dry and intact without evidence of skin break down, no evidence of skin tears noted. IV catheter discontinued intact. Site without signs and symptoms of complications. Dressing and pressure applied. Pt denies pain at this time. No complaints noted.  An After Visit Summary was printed and given to the patient. Patient escorted via [place], and D/C home via private auto.  Rolley Sims

## 2018-07-27 NOTE — Care Management (Signed)
New ABG order placed.  Patients PH 7.3 on pervious ABS.   [employer] has denied NIV.  Needs to be at least 7.5.  Referral also made to [facility] for portable O2 as patient does not have a portable tank.

## 2018-07-29 ENCOUNTER — Encounter: Payer: Self-pay | Admitting: *Deleted

## 2018-07-29 ENCOUNTER — Other Ambulatory Visit: Payer: Self-pay | Admitting: *Deleted

## 2018-07-29 NOTE — Patient Outreach (Signed)
[facility]) [place] Telephone Outreach PCP office completes Transition of Care outreach post-hospital discharge Post-hospital discharge day # 2  07/29/2018  Michael Wright 04-07-1941 [id number]  Successful telephone outreach to [facility], 77 y/o male referred to [place] by inpatient RN CM after recent hospitalization November 25-27, 2019 for shortness of breath and COPD exacerbation.  Patient was discharged home to self-care with home health services in place through [facility] for PT, RN, bath aide, and CSW.  Patient has history including, but not limited to, COPD; CHF; BPH; HTN/ HLD; previous TIA.  HIPAA/ identity verified with patient during phone call today, and patient provides verbal permission to speak with his daughter Michael Wright, "at any time" in the future.  Michael Wright is present and actively participates in phone call today while phone is on speaker mode.  Best number to reach caregiver/ daughter Michael Wright is provided as [phone].  Pleasant 35 minute phone call.  Today, patient reports that he "is doing better," but admits that he is tired after being hospitalized.  Patient sounds to be in no obvious distress throughout phone call today.  Patient/ caregiver further report:  Medications: -- Has all medicationsand takes as prescribed;all changes were reviewed with patient and caregiver, who both confirm that patient has obtained and started taking medications as prescribed post-hospital discharge, including new antibiotic, prednisone, new lasix dosing (increased from 20 mg QD to 40 mg QD) -- denies issues with swallowing or affording medications -- daughter's/ caregivers assist with medication administration and weekly pill-box filling -- daughter has many questions around medications as currently prescribed:  States that she is worried that hospital MD increased patient's lasix in setting of concurrent CKD; states that she has  started giving patient lasix 40 mg po QD as instructed, but "has backed off on Metformin" and is only currently giving "once daily" as she has "been told" that metformin can harm the kidney's also.  Caregiver stated that she has placed follow calls to patient's providers to ask about her concerns/ clarify appropriate doses patient should be on post-hospital discharge, and is waiting call-back from providers.  Discussed option of my placing [place] referral to assist patient/ caregiver in navigating multiple medications/ recent changes, and both gladly accept this offer.  Confirmed that patient is taking prednisone as ordered post-hospital discharge.   Home health [facility]) services: -- [place] services for PT, OT, RN in place through [place]; provided phone numbers for [facility] to patient and caregiver; caregiver states that she will contact agency as soon as we hang up to confirm schedule for home visits -- discussed with caregiver that I can assist with initiation/ confirmation of [place] services if needed  -- confirms that DME (new oxygenator) was delivered to patient's home on the evening of his discharge  Provider appointments: -- All upcoming provider appointments were reviewed with patient/ caregiver today-- confirm that post-hospital PCP office visit is scheduled for August 09, 2018 -- discussed value/ importance of patient/ caregiver promptly notifying care providers for any new concerns, issues, or problems that arise post-hospital discharge  Social/ Community Resource needs: -- currently denies community resource needs, stating supportive family members that assist with care needs as indicated- patient has several local daughters that live nearby him -- family provides transportation for patient to all provider appointments, errands, etc  Self-health management of COPD/ DM: -- currently using O2 at home at 3 L/min "all the time;" previously used only at night -- denies  issues or concerns around current breathing status-- states "breathing much better" post-hospital discharge -- reports "trying to get back into swing' of daily blood sugar monitoring; just re-started monitoring this morning: reports fasting blood sugar of 323 today- encouraged patient to continue this practice and to record blood sugars  Patient/ caregiver deny further issues, concerns, or problems today, and I encouraged caregiver to promptly contact home health agency, which she agrees to do.  I provided/ confirmed that patient has my direct phone number, the main [facility] CM office phone number, and the [facility] CM 24-hour nurse advice phone number should issues arise prior to next scheduled [place] outreach.  Encouraged patient to contact me directly if needs, questions, issues, or concerns arise prior to next scheduled outreach; patient agreed to do so.  Plan:  Patient will take medications as prescribed and will attend all scheduled provider appointments  Patient or caregiver will promptly notify care providers for any new concerns/ issues/ problems that arise  Patient will actively participate in home health services as ordered post-hospital discharge  I will place [place] referral to address patient/ caregiver's desire for medication review/ polypharmacy in setting of multiple chronic comorbidities  I will make patien't PCP aware of [place] RN CM involvement in patient's care-- will send barriers letter  [place] outreach to continue with scheduled phone call next week  [facility] CM Care Plan Problem One     Most Recent Value  Care Plan Problem One  High risk for hospital readmission, related to/ as evidenced by recent hospitalization November 25-27, 2019 for COPD exacerbation  Role Documenting the Problem One  Care Management [place] for Problem One  Active  THN Long Term Goal   Over the next 31 days, patient will not experience hospital readmission, as  evidenced by patient reporting and review of EMR during [place] RN CM outreach  [place] Term Goal Start Date  07/29/18  Interventions for Problem One Long Term Goal  Initiated [place] program,  discussed hospital discharge instructions with patient and his daughter,  placed [place] referral  THN CM Short Term Goal #1   Over the next 30 days, patient will actively participate in home health services as ordered post-hospital discharge, as evidenced by patient reporting and collaboration as indicated during [place] outreach  [facility] CM Short Term Goal #1 Start Date  07/29/18  Interventions for Short Term Goal #1  Confirmed for patient and caregiver difference between [place] and home health services,  discussed orders for home health per discharge summary/ IP CM notes,  provided contact information for home health agency to patient and caregiver and encouraged patient's active participation in home health services  THN CM Short Term Goal #2   Over the next 30 days, patient and caregiver will engage with [place] Pharmacist for medication review, as evidenced by patient/ caregiver reporting and collaboration with [facility] Pharmacist as indicated during [place] outreach  [facility] CM Short Term Goal #2 Start Date  07/29/18  Interventions for Short Term Goal #2  Discussed with patient and caregiver changes made to patient's medications post-hospital discharge,  placed [place] team referral and encouraged patient/ caregiver to actively engage with [facility] Pharmacist once contact is initiated     I appreciate the opportunity to participate in Mr. Scheibe's care,  Oneta Rack, RN, BSN, [employer] Coordinator [facility] Care Management  [phone]

## 2018-07-30 DIAGNOSIS — E1122 Type 2 diabetes mellitus with diabetic chronic kidney disease: Secondary | ICD-10-CM | POA: Diagnosis not present

## 2018-07-30 DIAGNOSIS — I130 Hypertensive heart and chronic kidney disease with heart failure and stage 1 through stage 4 chronic kidney disease, or unspecified chronic kidney disease: Secondary | ICD-10-CM | POA: Diagnosis not present

## 2018-07-30 DIAGNOSIS — J441 Chronic obstructive pulmonary disease with (acute) exacerbation: Secondary | ICD-10-CM | POA: Diagnosis not present

## 2018-07-30 DIAGNOSIS — J9611 Chronic respiratory failure with hypoxia: Secondary | ICD-10-CM | POA: Diagnosis not present

## 2018-07-30 DIAGNOSIS — N183 Chronic kidney disease, stage 3 (moderate): Secondary | ICD-10-CM | POA: Diagnosis not present

## 2018-07-30 DIAGNOSIS — J9612 Chronic respiratory failure with hypercapnia: Secondary | ICD-10-CM | POA: Diagnosis not present

## 2018-07-30 DIAGNOSIS — J189 Pneumonia, unspecified organism: Secondary | ICD-10-CM | POA: Diagnosis not present

## 2018-07-30 DIAGNOSIS — I5032 Chronic diastolic (congestive) heart failure: Secondary | ICD-10-CM | POA: Diagnosis not present

## 2018-07-30 DIAGNOSIS — J440 Chronic obstructive pulmonary disease with acute lower respiratory infection: Secondary | ICD-10-CM | POA: Diagnosis not present

## 2018-07-31 DIAGNOSIS — J189 Pneumonia, unspecified organism: Secondary | ICD-10-CM | POA: Diagnosis not present

## 2018-07-31 DIAGNOSIS — J440 Chronic obstructive pulmonary disease with acute lower respiratory infection: Secondary | ICD-10-CM | POA: Diagnosis not present

## 2018-07-31 DIAGNOSIS — I5032 Chronic diastolic (congestive) heart failure: Secondary | ICD-10-CM | POA: Diagnosis not present

## 2018-07-31 DIAGNOSIS — J441 Chronic obstructive pulmonary disease with (acute) exacerbation: Secondary | ICD-10-CM | POA: Diagnosis not present

## 2018-07-31 DIAGNOSIS — J9612 Chronic respiratory failure with hypercapnia: Secondary | ICD-10-CM | POA: Diagnosis not present

## 2018-07-31 DIAGNOSIS — N183 Chronic kidney disease, stage 3 (moderate): Secondary | ICD-10-CM | POA: Diagnosis not present

## 2018-07-31 DIAGNOSIS — J9611 Chronic respiratory failure with hypoxia: Secondary | ICD-10-CM | POA: Diagnosis not present

## 2018-07-31 DIAGNOSIS — E1122 Type 2 diabetes mellitus with diabetic chronic kidney disease: Secondary | ICD-10-CM | POA: Diagnosis not present

## 2018-07-31 DIAGNOSIS — I130 Hypertensive heart and chronic kidney disease with heart failure and stage 1 through stage 4 chronic kidney disease, or unspecified chronic kidney disease: Secondary | ICD-10-CM | POA: Diagnosis not present

## 2018-08-01 DIAGNOSIS — I10 Essential (primary) hypertension: Secondary | ICD-10-CM | POA: Diagnosis not present

## 2018-08-01 DIAGNOSIS — G451 Carotid artery syndrome (hemispheric): Secondary | ICD-10-CM | POA: Diagnosis not present

## 2018-08-01 DIAGNOSIS — N183 Chronic kidney disease, stage 3 (moderate): Secondary | ICD-10-CM | POA: Diagnosis not present

## 2018-08-01 DIAGNOSIS — I5032 Chronic diastolic (congestive) heart failure: Secondary | ICD-10-CM | POA: Diagnosis not present

## 2018-08-01 DIAGNOSIS — E7800 Pure hypercholesterolemia, unspecified: Secondary | ICD-10-CM | POA: Diagnosis not present

## 2018-08-02 ENCOUNTER — Other Ambulatory Visit: Payer: Self-pay | Admitting: *Deleted

## 2018-08-02 ENCOUNTER — Encounter: Payer: Self-pay | Admitting: *Deleted

## 2018-08-02 NOTE — Patient Outreach (Signed)
[facility]) [place],  PCP office completes Transition of Care follow up post-hospital discharge Post-hospital discharge day # 6 EMMI Red-Flag follow up  08/02/2018  Michael Wright 1941/07/28 [id number]  Successful telephone outreach to [facility], 77 y/o male referred to [place] by inpatient RN CM after recent hospitalization November 25-27, 2019 for shortness of breath and COPD exacerbation.  Patient was discharged home to self-care with home health services in place [facility] for PT, RN, bath aide, and CSW.  Patient has history including, but not limited to, COPD; CHF; BPH; HTN/ HLD; previous TIA.  HIPAA/ identity verified with patient during phone call today, and patient's daughter/ caregiver Michael Wright, whom patient provided previous verbal permission to speak with, is also present and actively participates in phone call today.  Today, patient reports that he "is doing better," but continues to state that he is tired after being hospitalized.  Patient sounds to be in no obvious distress throughout phone call today, and he denies pain and new/ recent falls.  Discussed with patient that I had received EMMI red-flag trigger alerts around automated calls he is receiving at home post-hospital discharge:  From 07/29/18 EMMI call: Red-Flag alert regarding scheduled follow up; unfilled prescriptions; who to call for changes in condition: these were previously addressed by this [facility] RN CM at time of last call to patient on afternoon of 07/29/18- please see details of that note From 08/01/18 EMMI call:  Red-Flag alert regarding scheduled follow up and feelings of sadness, emptiness and hopelessness: these were addressed with patient today; caregiver confirms that patient attended recent follow up appointment with cardiologist yesterday and have scheduld PCP office visit next week.  Caregiver confirms that she will provide  transportation to all provide appointments and will attend all appointments with patient.  Patient clearly admits to be depressed today, stating that his wife past away earlier this year, and that with his recent illness, he feels "like I can't get out to do anything."  Patient positive for depression on depression screen, however, he states that he believes this "is to be expected when you are as sick as I am."  Patient clearly and adamantly denies suicidal ideation today and declines need for treatment of depression.  I encouraged patient to discuss with his PCP at upcoming scheduled appointment, however, he stated that he did not wish to discuss "with anyone;" stating, "it won't do any good," and adding that he does not wish to add "any more medications" to his already lengthy list of prescriptions.  I discussed with patient that I would share his feelings of depression wit his PCP, and patient's response was "whatever you want to do."  Patient/ caregiver further report:  -- attended cardiology appointment yesterday and "got a good report;" states that they discussed current medications with cardiologist who advised to "continue doing" what they are with medications.  Caregiver reports that patient has completed both his post-hospital discharge antibiotics and prednisone- reports took both as prescribed.  Patient and caregiver decline medication review today, stating that patient has a friend visiting with him this afternoon; shared with patient and caregiver that I had placed [place] referral last week, and encouraged them to fully engage with pharmacist once outreach is made; both agree to do so.  -- caregiver was successful in contacting home health [facility]) agency and reports today that [facility] team has visited patient several times since his hospital discharge; reports nurse and PT  visiting along with bath aide.  [place] services are through [facility] and caregiver confirms that she has the contact information  for [facility] team.  -- plans to attend scheduled post-hospital discharge PCP office visit on August 09, 2018; caregiver states that she will take patient to visit and will attend with him.  I encouraged both caregiver and patient to discuss patient's ongoing depression with PCP, and advised them to take all patient's medications to appointment with them for review.  Again discussed value/ importance of patient/ caregiver promptly notifying care providers for any new concerns, issues, or problems that arise post-hospital discharge  -- continues using O2 at home at 3- 4 L/min "all the time;" denies concerns/ issues around patient's breathing status, stating that he "seems better" now that he has been home for several days  Patient/ caregiver deny further issues, concerns, or problems today.  I confirmed that patient/ caregiver havemy direct phone number, the main [facility] CM office phone number, and the [facility] CM 24-hour nurse advice phone number should issues arise prior to next scheduled [place] outreach.  Encouraged patient to contact me directly if needs, questions, issues, or concerns arise prior to next scheduled outreach; patient/ caregiver agreed to do so.  Plan:  Patient will take medications as prescribed and will attend all scheduled provider appointments  Patient or caregiver will promptly notify care providers for any new concerns/ issues/ problems that arise  Patient will actively participate in home health services as ordered post-hospital discharge  Patient/ caregiver will actively engage with and communicate with [facility] Pharmacist for medication review/ polypharmacy in setting of multiple chronic comorbidities  [facility] Community CM outreach to continue with scheduled phone call post- upcoming scheduled PCP appointment  [place] Problem One     Most Recent Value  Care Plan Problem One  High risk for hospital readmission, related to/ as evidenced by recent hospitalization  November 25-27, 2019 for COPD exacerbation  Role Documenting the Problem One  Care Management [place] for Problem One  Active  THN Long Term Goal   Over the next 31 days, patient will not experience hospital readmission, as evidenced by patient reporting and review of EMR during [place] RN CM outreach  [place] Term Goal Start Date  07/29/18  Interventions for Problem One Long Term Goal  Discussed current clinical condition with patient and caregiver,  discussed EMMI red-flag triggers,  discussed patient's admitted depression with him and encouraged him to consider discussing with his PCP at upcoming scheduled appointment  [facility] CM Short Term Goal #1   Over the next 30 days, patient will actively participate in home health services as ordered post-hospital discharge, as evidenced by patient reporting and collaboration as indicated during [place] outreach  [facility] CM Short Term Goal #1 Start Date  07/29/18  Interventions for Short Term Goal #1  Confirmed that patient's caregiver wasable to connect with home health team and that services have arrived,  reviewed recent home visits made with patient and encouraged his ongoing active participation in all services  THN CM Short Term Goal #2   Over the next 30 days, patient and caregiver will engage with [place] Pharmacist for medication review, as evidenced by patient/ caregiver reporting and collaboration with [facility] Pharmacist as indicated during [place] outreach  [facility] CM Short Term Goal #2 Start Date  07/29/18  Interventions for Short Term Goal #2  Confirmed that caregiver has continued managing patient's medications  and that he completed recently prescribed post-hospital discharge course of antibiotics and prednisone,  confirmed with patient and caregiver that I had placed [place] referral and encouraged patient and caregiver to engage and communicate with pharmacist once outreach is completed     Oneta Rack, RN,  BSN, [employer] Coordinator [facility] Care Management  [phone]

## 2018-08-03 DIAGNOSIS — J189 Pneumonia, unspecified organism: Secondary | ICD-10-CM | POA: Diagnosis not present

## 2018-08-03 DIAGNOSIS — J441 Chronic obstructive pulmonary disease with (acute) exacerbation: Secondary | ICD-10-CM | POA: Diagnosis not present

## 2018-08-03 DIAGNOSIS — J9612 Chronic respiratory failure with hypercapnia: Secondary | ICD-10-CM | POA: Diagnosis not present

## 2018-08-03 DIAGNOSIS — E1122 Type 2 diabetes mellitus with diabetic chronic kidney disease: Secondary | ICD-10-CM | POA: Diagnosis not present

## 2018-08-03 DIAGNOSIS — N183 Chronic kidney disease, stage 3 (moderate): Secondary | ICD-10-CM | POA: Diagnosis not present

## 2018-08-03 DIAGNOSIS — J440 Chronic obstructive pulmonary disease with acute lower respiratory infection: Secondary | ICD-10-CM | POA: Diagnosis not present

## 2018-08-03 DIAGNOSIS — I5032 Chronic diastolic (congestive) heart failure: Secondary | ICD-10-CM | POA: Diagnosis not present

## 2018-08-03 DIAGNOSIS — J9611 Chronic respiratory failure with hypoxia: Secondary | ICD-10-CM | POA: Diagnosis not present

## 2018-08-03 DIAGNOSIS — I130 Hypertensive heart and chronic kidney disease with heart failure and stage 1 through stage 4 chronic kidney disease, or unspecified chronic kidney disease: Secondary | ICD-10-CM | POA: Diagnosis not present

## 2018-08-04 ENCOUNTER — Telehealth: Payer: Self-pay | Admitting: Pharmacist

## 2018-08-04 LAB — BLOOD GAS, ARTERIAL
ACID-BASE EXCESS: 13.7 mmol/L — AB (ref 0.0–2.0)
BICARBONATE: 39 mmol/L — AB (ref 20.0–28.0)
FIO2: 0.36
O2 SAT: 94.2 %
PATIENT TEMPERATURE: 37
PH ART: 7.5 — AB (ref 7.350–7.450)
pCO2 arterial: 50 mmHg — ABNORMAL HIGH (ref 32.0–48.0)
pO2, Arterial: 65 mmHg — ABNORMAL LOW (ref 83.0–108.0)

## 2018-08-04 NOTE — Telephone Encounter (Signed)
-----   Message from Jiles Harold sent at 08/01/2018 10:03 AM EST ----- Regarding: FW: Order for Harlin Rain  Referral from Reginia Naas, RN  "Please see below request"  Forde Radon ----- Message ----- From: Knox Royalty, RN Sent: 07/30/2018  11:31 AM EST To: Thn Cm Communication Orders Subject: Order for EVANGELOS, PAULINO                  Patient Name: Michael Wright, Michael Wright H([id number]) Sex: Male DOB: 08/29/1941    PCP: [place] Pink   Center: [facility]   Types of orders made on 07/30/2018: Nursing  Order Date:07/30/2018 Ordering Aline August [[id number]] Encounter Provider:Tousey, Lenon Oms, RN [[id number]] Authorizing Pro vider: Maryland Pink, MD [phone] Department:[facility]N-COMMUNITY[[id number]]  Order Specific Information Order: Comm to Pharmacy [Custom: [id number]]  Order #: [id number] Qty: 1   Priority: Routine  Class: Clinic Performed     Reason for Consult -> Medication Adherence          -> Medication Management          -> Polypharmacy       Priority: Routine  Class: Clinic Performed    Scheduling I nstructions:   Please refer to [place] team for medication review/ medication    management/ adherence.  Please contact patient's daughter/ caregiver [name] at    [phone], as she manages patient's medications.  Patient provided verbal    permission to speak to [facility] at any time.  Caregiver has many good questions/    concerns around appropriate dosing of multiple medications in setting of    concurrent COPD/ CHF and CKD, and has  verbalized that she is not sure all    prescribing providers are collaborating on most appropriate dosing of    medications.  Needs post-hospital medication review.  I confirmed that    patient has obtained/ is taking all new post hospital discharge medications.     Was hospitalized for COPD exacerbation; multiple chronic comorbidities.     Please call for questions.   Oneta Rack, RN,  BSN, [place] or   [facility] Care Management    [phone]     Reason for Consult -> Medication Adherence          -> Medication Management          -> Polypharmacy

## 2018-08-04 NOTE — Patient Outreach (Addendum)
[place] [facility]) Care Management  [place]   08/04/2018  Damaria L Scalzo [birth year] [id number]  Subjective: Referral was received from [place] referred patient for post discharge medication review and to answer medication questions his daughter [name]) wanted to ask about furosemide and metformin.  [name] was called per referral. HIPAA identifiers were obtained.  Patient is a 77 year old male with multiple medical conditions including but not limited to:  HTN, hyperlipidemia, type 2 diabetes, and  history of stoke.  Patient was recently hospitalized for acute respiratory failure, pneumonia, and elevated troponin levels.  Objective:   Encounter Medications: Outpatient Encounter Medications as of 08/04/2018  Medication Sig  . allopurinol (ZYLOPRIM) 100 MG tablet Take 100 mg by mouth 2 (two) times daily.   Marland Kitchen aspirin EC 81 MG tablet Take 81 mg by mouth daily.  . carvedilol (COREG) 3.125 MG tablet Take 3.125 mg by mouth 2 (two) times daily with a meal.  . colchicine 0.6 MG tablet Take 0.6 mg by mouth 2 (two) times daily as needed (for gout flares).  . Cyanocobalamin 3000 MCG SUBL Place 3,000 mcg under the tongue daily at 12 noon.  . donepezil (ARICEPT) 10 MG tablet Take 10 mg by mouth daily.   . finasteride (PROSCAR) 5 MG tablet Take 1 tablet (5 mg total) by mouth daily.  . furosemide (LASIX) 40 MG tablet Take 1 tablet (40 mg total) by mouth daily.  Marland Kitchen HYDROcodone-homatropine (HYCODAN) 5-1.5 MG/5ML syrup Take 3 mLs by mouth every 6 (six) hours as needed for cough.  . insulin glargine (LANTUS) 100 unit/mL SOPN Inject 0.36 mLs (36 Units total) into the skin at bedtime. (Patient taking differently: Inject 42 Units into the skin at bedtime. )  . ipratropium-albuterol (DUONEB) 0.5-2.5 (3) MG/3ML SOLN Take 3 mLs by nebulization every 6 (six) hours as needed (For wheezing and shortness of breath).  . loratadine (CLARITIN) 10 MG tablet Take 10 mg by mouth  daily.  . metFORMIN (GLUCOPHAGE-XR) 500 MG 24 hr tablet Take 500 mg by mouth 2 (two) times daily.  . OXYGEN Inhale into the lungs at bedtime as needed.  . pantoprazole (PROTONIX) 40 MG tablet Take 1 tablet (40 mg total) by mouth daily.  . potassium chloride 20 MEQ TBCR Take 20 mEq by mouth daily. While on lasix  . pravastatin (PRAVACHOL) 40 MG tablet Take 40 mg by mouth at bedtime.   . traZODone (DESYREL) 50 MG tablet Take 1 tablet (50 mg total) by mouth at bedtime.  . [DISCONTINUED] predniSONE (DELTASONE) 10 MG tablet Label  & dispense according to the schedule below. 5 Pills PO for 1 day then, 4 Pills PO for 1 day, 3 Pills PO for 1 day, 2 Pills PO for 1 day, 1 Pill PO for 1 days then STOP. (Patient not taking: Reported on 08/04/2018)   No facility-administered encounter medications on file as of 08/04/2018.     Functional Status: In your present state of health, do you have any difficulty performing the following activities: 08/02/2018 07/26/2018  Hearing? [place]? Y -  Difficulty concentrating or making decisions? N -  Walking or climbing stairs? Y -  Dressing or bathing? Y -  Doing errands, shopping? [place]  Preparing Food and eating ? N -  Comment Family assists -  Using the Toilet? N -  In the past six months, have you accidently leaked urine? N -  Do you have problems with loss of bowel control? N -  Managing your Medications? Y -  Comment Family assists -  Managing your Finances? Y -  Comment Family assists -  Housekeeping or managing your Housekeeping? Y -  Comment Family assists -  Some recent data might be hidden    Fall/Depression Screening: Fall Risk  08/02/2018  Falls in the past year? 0  Number falls in past yr: 0  Injury with Fall? 0  Comment N/A- patient reports no falls over past year  Risk for fall due to : Impaired mobility;Medication side effect;Other (Comment)  Risk for fall due to: Comment fatigue post-hospitalization  Follow up Falls prevention discussed    PHQ 2/9 Scores 08/02/2018  PHQ - 2 Score 2  PHQ- 9 Score 6      Assessment: ASSESSMENT: Date Discharged from Hospital: 07/27/2018 Date Medication Reconciliation Performed: 08/04/2018   Medications Discontinued at Discharge:    Doxycycline  New Medications at Discharge:   Levofloxacin   Prednisone   Patient was recently discharged from hospital and all medications have been reviewed.  Drugs sorted by system:  Neurologic/Psychologic: Donepezil, Trazodone  Cardiovascular: Aspirin, Carvedilol, furosemide, Pravastatin,   Pulmonary/Allergy: Hydrocodone-homatropine, Ipratropium-albuterol nebulizer solution, loratadine,  Oxygen,   Gastrointestinal: Pantoprazole,   Endocrine: Lantus, metformin,   Renal: Allopurinol, Colchicine,   Vitamins/Minerals/Supplements: Potassium Chloride, Cyanocobalamin  Genitourinary: Finasteride   PLAN/Interventions Patient's daughter said she had already spoken with the patient's Nephrologist who confirmed the patient should continue the dose of metformin and furosemide that were prescribed at discharge until his appointment with them on Monday. More labs will be drawn at that time.    Reviewed patient's medications with daughter as she and her sister manage the patient's medications and use a pill box.  Educated patient's caregiver on metformin dosing guidelines and encouraged her to give the patient his medications as prescribed.  Consulted with patient's [facility] Nurse Case Manager telephonically.  Will close the patient's pharmacy case as the patient's daughter did not have any more questions.   Elayne Guerin, PharmD, [place] Clinical Pharmacist [phone]

## 2018-08-05 DIAGNOSIS — E1122 Type 2 diabetes mellitus with diabetic chronic kidney disease: Secondary | ICD-10-CM | POA: Diagnosis not present

## 2018-08-05 DIAGNOSIS — J9611 Chronic respiratory failure with hypoxia: Secondary | ICD-10-CM | POA: Diagnosis not present

## 2018-08-05 DIAGNOSIS — J189 Pneumonia, unspecified organism: Secondary | ICD-10-CM | POA: Diagnosis not present

## 2018-08-05 DIAGNOSIS — I130 Hypertensive heart and chronic kidney disease with heart failure and stage 1 through stage 4 chronic kidney disease, or unspecified chronic kidney disease: Secondary | ICD-10-CM | POA: Diagnosis not present

## 2018-08-05 DIAGNOSIS — I5032 Chronic diastolic (congestive) heart failure: Secondary | ICD-10-CM | POA: Diagnosis not present

## 2018-08-05 DIAGNOSIS — J441 Chronic obstructive pulmonary disease with (acute) exacerbation: Secondary | ICD-10-CM | POA: Diagnosis not present

## 2018-08-05 DIAGNOSIS — N183 Chronic kidney disease, stage 3 (moderate): Secondary | ICD-10-CM | POA: Diagnosis not present

## 2018-08-05 DIAGNOSIS — J9612 Chronic respiratory failure with hypercapnia: Secondary | ICD-10-CM | POA: Diagnosis not present

## 2018-08-05 DIAGNOSIS — J440 Chronic obstructive pulmonary disease with acute lower respiratory infection: Secondary | ICD-10-CM | POA: Diagnosis not present

## 2018-08-08 DIAGNOSIS — J9611 Chronic respiratory failure with hypoxia: Secondary | ICD-10-CM | POA: Diagnosis not present

## 2018-08-08 DIAGNOSIS — I130 Hypertensive heart and chronic kidney disease with heart failure and stage 1 through stage 4 chronic kidney disease, or unspecified chronic kidney disease: Secondary | ICD-10-CM | POA: Diagnosis not present

## 2018-08-08 DIAGNOSIS — I5032 Chronic diastolic (congestive) heart failure: Secondary | ICD-10-CM | POA: Diagnosis not present

## 2018-08-08 DIAGNOSIS — R809 Proteinuria, unspecified: Secondary | ICD-10-CM | POA: Diagnosis not present

## 2018-08-08 DIAGNOSIS — I10 Essential (primary) hypertension: Secondary | ICD-10-CM | POA: Diagnosis not present

## 2018-08-08 DIAGNOSIS — E1122 Type 2 diabetes mellitus with diabetic chronic kidney disease: Secondary | ICD-10-CM | POA: Diagnosis not present

## 2018-08-08 DIAGNOSIS — N183 Chronic kidney disease, stage 3 (moderate): Secondary | ICD-10-CM | POA: Diagnosis not present

## 2018-08-08 DIAGNOSIS — J441 Chronic obstructive pulmonary disease with (acute) exacerbation: Secondary | ICD-10-CM | POA: Diagnosis not present

## 2018-08-08 DIAGNOSIS — J9612 Chronic respiratory failure with hypercapnia: Secondary | ICD-10-CM | POA: Diagnosis not present

## 2018-08-08 DIAGNOSIS — J189 Pneumonia, unspecified organism: Secondary | ICD-10-CM | POA: Diagnosis not present

## 2018-08-08 DIAGNOSIS — J440 Chronic obstructive pulmonary disease with acute lower respiratory infection: Secondary | ICD-10-CM | POA: Diagnosis not present

## 2018-08-09 DIAGNOSIS — R0602 Shortness of breath: Secondary | ICD-10-CM | POA: Diagnosis not present

## 2018-08-09 DIAGNOSIS — R7981 Abnormal blood-gas level: Secondary | ICD-10-CM | POA: Diagnosis not present

## 2018-08-09 DIAGNOSIS — E1165 Type 2 diabetes mellitus with hyperglycemia: Secondary | ICD-10-CM | POA: Diagnosis not present

## 2018-08-09 DIAGNOSIS — I509 Heart failure, unspecified: Secondary | ICD-10-CM | POA: Diagnosis not present

## 2018-08-10 DIAGNOSIS — I5032 Chronic diastolic (congestive) heart failure: Secondary | ICD-10-CM | POA: Diagnosis not present

## 2018-08-10 DIAGNOSIS — J9612 Chronic respiratory failure with hypercapnia: Secondary | ICD-10-CM | POA: Diagnosis not present

## 2018-08-10 DIAGNOSIS — N183 Chronic kidney disease, stage 3 (moderate): Secondary | ICD-10-CM | POA: Diagnosis not present

## 2018-08-10 DIAGNOSIS — E1122 Type 2 diabetes mellitus with diabetic chronic kidney disease: Secondary | ICD-10-CM | POA: Diagnosis not present

## 2018-08-10 DIAGNOSIS — J441 Chronic obstructive pulmonary disease with (acute) exacerbation: Secondary | ICD-10-CM | POA: Diagnosis not present

## 2018-08-10 DIAGNOSIS — J9611 Chronic respiratory failure with hypoxia: Secondary | ICD-10-CM | POA: Diagnosis not present

## 2018-08-10 DIAGNOSIS — J189 Pneumonia, unspecified organism: Secondary | ICD-10-CM | POA: Diagnosis not present

## 2018-08-10 DIAGNOSIS — J440 Chronic obstructive pulmonary disease with acute lower respiratory infection: Secondary | ICD-10-CM | POA: Diagnosis not present

## 2018-08-10 DIAGNOSIS — I130 Hypertensive heart and chronic kidney disease with heart failure and stage 1 through stage 4 chronic kidney disease, or unspecified chronic kidney disease: Secondary | ICD-10-CM | POA: Diagnosis not present

## 2018-08-11 ENCOUNTER — Encounter: Payer: Self-pay | Admitting: *Deleted

## 2018-08-11 ENCOUNTER — Other Ambulatory Visit: Payer: Self-pay | Admitting: *Deleted

## 2018-08-11 DIAGNOSIS — I5032 Chronic diastolic (congestive) heart failure: Secondary | ICD-10-CM | POA: Diagnosis not present

## 2018-08-11 DIAGNOSIS — I130 Hypertensive heart and chronic kidney disease with heart failure and stage 1 through stage 4 chronic kidney disease, or unspecified chronic kidney disease: Secondary | ICD-10-CM | POA: Diagnosis not present

## 2018-08-11 DIAGNOSIS — J189 Pneumonia, unspecified organism: Secondary | ICD-10-CM | POA: Diagnosis not present

## 2018-08-11 DIAGNOSIS — R0609 Other forms of dyspnea: Secondary | ICD-10-CM | POA: Diagnosis not present

## 2018-08-11 DIAGNOSIS — J441 Chronic obstructive pulmonary disease with (acute) exacerbation: Secondary | ICD-10-CM | POA: Diagnosis not present

## 2018-08-11 DIAGNOSIS — J9611 Chronic respiratory failure with hypoxia: Secondary | ICD-10-CM | POA: Diagnosis not present

## 2018-08-11 DIAGNOSIS — E1122 Type 2 diabetes mellitus with diabetic chronic kidney disease: Secondary | ICD-10-CM | POA: Diagnosis not present

## 2018-08-11 DIAGNOSIS — J90 Pleural effusion, not elsewhere classified: Secondary | ICD-10-CM | POA: Diagnosis not present

## 2018-08-11 DIAGNOSIS — J9612 Chronic respiratory failure with hypercapnia: Secondary | ICD-10-CM | POA: Diagnosis not present

## 2018-08-11 DIAGNOSIS — N183 Chronic kidney disease, stage 3 (moderate): Secondary | ICD-10-CM | POA: Diagnosis not present

## 2018-08-11 DIAGNOSIS — J440 Chronic obstructive pulmonary disease with acute lower respiratory infection: Secondary | ICD-10-CM | POA: Diagnosis not present

## 2018-08-11 DIAGNOSIS — J449 Chronic obstructive pulmonary disease, unspecified: Secondary | ICD-10-CM | POA: Diagnosis not present

## 2018-08-11 NOTE — Patient Outreach (Signed)
[facility]) [place] Telephone Outreach PCP office completes transition of care follow up post-hospital discharge Post-hospital discharge day # 15 Unsuccessful telephone outreach attempt # 1- engaged patient  08/11/2018  Michael Wright [facility] Sep 13, 1940 [id number]  Unsuccessful telephone outreach x 2 to [facility], 77 y/o male referred to [place] inpatient RN CM afterrecent hospitalization November 25-27, 2019 for shortness of breath and COPD exacerbation.Patient was discharged home to self-care with home health services in place [facility] for PT, RN, bath aide, and CSW. Patient has history including, but not limited to, COPD; CHF; BPH; HTN/ HLD; previous TIA.  Attempted call to both patient and his daughter/ caregiver Michael Wright; HIPAA compliant voice mail message left for both, requesting return call back.  Plan:  Will re-attempt [place] telephone outreach again in 3-4 business days if I do not hear back from patient or caregiver first.  Oneta Rack, RN, BSN, [place] Coordinator [facility] Care Management  [phone]

## 2018-08-12 DIAGNOSIS — N183 Chronic kidney disease, stage 3 (moderate): Secondary | ICD-10-CM | POA: Diagnosis not present

## 2018-08-12 DIAGNOSIS — I130 Hypertensive heart and chronic kidney disease with heart failure and stage 1 through stage 4 chronic kidney disease, or unspecified chronic kidney disease: Secondary | ICD-10-CM | POA: Diagnosis not present

## 2018-08-12 DIAGNOSIS — I5032 Chronic diastolic (congestive) heart failure: Secondary | ICD-10-CM | POA: Diagnosis not present

## 2018-08-12 DIAGNOSIS — J441 Chronic obstructive pulmonary disease with (acute) exacerbation: Secondary | ICD-10-CM | POA: Diagnosis not present

## 2018-08-12 DIAGNOSIS — J9612 Chronic respiratory failure with hypercapnia: Secondary | ICD-10-CM | POA: Diagnosis not present

## 2018-08-12 DIAGNOSIS — E1122 Type 2 diabetes mellitus with diabetic chronic kidney disease: Secondary | ICD-10-CM | POA: Diagnosis not present

## 2018-08-12 DIAGNOSIS — J189 Pneumonia, unspecified organism: Secondary | ICD-10-CM | POA: Diagnosis not present

## 2018-08-12 DIAGNOSIS — J440 Chronic obstructive pulmonary disease with acute lower respiratory infection: Secondary | ICD-10-CM | POA: Diagnosis not present

## 2018-08-12 DIAGNOSIS — J9611 Chronic respiratory failure with hypoxia: Secondary | ICD-10-CM | POA: Diagnosis not present

## 2018-08-15 DIAGNOSIS — N183 Chronic kidney disease, stage 3 (moderate): Secondary | ICD-10-CM | POA: Diagnosis not present

## 2018-08-16 DIAGNOSIS — J441 Chronic obstructive pulmonary disease with (acute) exacerbation: Secondary | ICD-10-CM | POA: Diagnosis not present

## 2018-08-16 DIAGNOSIS — I130 Hypertensive heart and chronic kidney disease with heart failure and stage 1 through stage 4 chronic kidney disease, or unspecified chronic kidney disease: Secondary | ICD-10-CM | POA: Diagnosis not present

## 2018-08-16 DIAGNOSIS — I5032 Chronic diastolic (congestive) heart failure: Secondary | ICD-10-CM | POA: Diagnosis not present

## 2018-08-16 DIAGNOSIS — J440 Chronic obstructive pulmonary disease with acute lower respiratory infection: Secondary | ICD-10-CM | POA: Diagnosis not present

## 2018-08-16 DIAGNOSIS — J9612 Chronic respiratory failure with hypercapnia: Secondary | ICD-10-CM | POA: Diagnosis not present

## 2018-08-16 DIAGNOSIS — E1122 Type 2 diabetes mellitus with diabetic chronic kidney disease: Secondary | ICD-10-CM | POA: Diagnosis not present

## 2018-08-16 DIAGNOSIS — J9611 Chronic respiratory failure with hypoxia: Secondary | ICD-10-CM | POA: Diagnosis not present

## 2018-08-16 DIAGNOSIS — N183 Chronic kidney disease, stage 3 (moderate): Secondary | ICD-10-CM | POA: Diagnosis not present

## 2018-08-16 DIAGNOSIS — J189 Pneumonia, unspecified organism: Secondary | ICD-10-CM | POA: Diagnosis not present

## 2018-08-17 ENCOUNTER — Other Ambulatory Visit: Payer: Self-pay | Admitting: *Deleted

## 2018-08-17 ENCOUNTER — Encounter: Payer: Self-pay | Admitting: *Deleted

## 2018-08-17 NOTE — Patient Outreach (Addendum)
[place] [facility]) [name] Telephone Outreach PCP completes Transition of Care follow up post-hospital discharge Post-hospital discharge day # 21  08/17/2018  Michael Wright [birth year] [id number]  Successful telephone outreach to [facility], 77 y/o male referred to [place] inpatient RN CM afterrecent hospitalization November 25-27, 2019 for shortness of breath and COPD exacerbation.Patient was discharged home to self-care with home health services in place through [facility] for PT, RN, bath aide, and CSW. Patient has history including, but not limited to, COPD; CHF; BPH; HTN/ HLD; previous TIA.HIPAA/ identity verified with patient during phone call today.  Today, patient reports that he "is doing pretty good," but continues to state that he is tired and "give out" after being hospitalized, although he states "it's getting better slowly." Patient sounds to be in no obvious distress throughout phone call today, and he denies pain and new/ recent falls.    Patient further reports:  -- No recent provider appointments; also states he has none coming up; reports that if he has appointments, daughter [name] will continue to transport him.   Encouraged patient to promptly notify care providers for any new concerns, issues, or problems that arise; patient agrees to do so.  Patient states that he is trying to find a new doctor to remove his cataracts as his current doctor has a waiting list "2 years out."  States that he is looking, but needs to clear with his daughter/ caregiver before making any decisions, as she provides his transportation; states caregiver gave up her job several years ago to take care of him; questioned patient today if he would like for me to place [facility] CSW referral for evaluation of transportation resources in his area; patient is agreeable-- discussed with patient role of [facility] CSW and encouraged him to actively engage  with CSW once outreach is made; patient agrees to do so.  -- home health [facility]) services active; reports today that [facility] team visited him yesterday, "all 5 of them."  Reports "going well," but tired after they leave."  Confirms that he has the contact information for [facility] team; encouraged his ongoing participation with all disciplines.  --continues using O2 at home at 3- 4 L/min "all the time;" denies concerns/ issues around breathing status, stating that he "seems to be doing okay, about the same as always," and he denies concerns around his breathing status  -- Advanced Directive (AD) planning:  Patient denies having AD's for living will/ HCPOA in place at this time; states he is "unsure" about how he "feels about" AD planning; education provided using teach back method, and patient agrees to my placing printed educational material in mail to him for his review/ further discussion  Patient deniesfurther issues, concerns, or problems today.  Iconfirmed that patient/ caregiver havemy direct phone number, the main [facility] CM office phone number, and the [facility] CM 24-hour nurse advice phone number should issues arise prior to next scheduled [place] outreach by phone. Encouraged patient to contact me directly if needs, questions, issues, or concerns arise prior to next scheduled outreach; patient/ caregiver agreed to do so.  Plan:  Patient will take medications as prescribed and will attend all scheduled provider appointments  Patientor caregiverwill promptly notify care providers for any new concerns/ issues/ problems that arise  Patient will actively participate in home health services as ordered post-hospital discharge  Patient/ caregiver will actively engage with and communicate with [facility] CSW around resources for transportation; referral placed today  I will mail printed educational material to patient around AD planning  I will share today's [place] outreach notes with  patient's PCP as initial assessment  [facility] CM outreach to continue with scheduled phone call within 2 weeks  [facility] CM Care Plan Problem One     Most Recent Value  Care Plan Problem One  High risk for hospital readmission, related to/ as evidenced by recent hospitalization November 25-27, 2019 for COPD exacerbation  Role Documenting the Problem One  Care Management [place] for Problem One  Active  [facility] Long Term Goal   Over the next 31 days, patient will not experience hospital readmission, as evidenced by patient reporting and review of EMR during [place] RN CM outreach  [place] Term Goal Start Date  07/29/18  Interventions for Problem One Long Term Goal  Discussed with patient his current clinical status and confirmed that he has no current concerns around his overall condition,  encouraged patient to promptly notify care providers for any concerns or issues/ problems that arise  [facility] CM Short Term Goal #1   Over the next 30 days, patient will actively participate in home health services as ordered post-hospital discharge, as evidenced by patient reporting and collaboration as indicated during [place] outreach  [facility] CM Short Term Goal #1 Start Date  07/29/18  Interventions for Short Term Goal #1  Confirmed that patient is actively participating in home health services and reviewed recent home visits with patient- encouraged his ongoing participation  [facility] CM Short Term Goal #2   Over the next 30 days, patient and caregiver will engage with [place] Pharmacist for medication review, as evidenced by patient/ caregiver reporting and collaboration with [facility] Pharmacist as indicated during [place] outreach  [facility] CM Short Term Goal #2 Start Date  07/29/18  [facility] CM Short Term Goal #2 Met Date  08/17/18 [Goal Met]  Interventions for Short Term Goal #2  Confirmed that patient's caregiver spoke with [facility] Pharmacist and that patient denies medication concerns today  [facility] CM  Short Term Goal #3  Over the next 14 days, patient will discuss transportation options with [facility] CSW as evidenced by patient reporting and collaboration with [facility] CSW as indicated during [facility] RN CCM outreach  [facility] CM Short Term Goal #3 Start Date  08/17/18  Interventions for Short Tern Goal #3  Discussed with patient transportation needs and placed referral for [facility] CSW to contact patient to discuss resources for transportation     Oneta Rack, Therapist, sports, Copywriter, advertising, [employer] Coordinator [facility] Care Management  [phone]

## 2018-08-18 DIAGNOSIS — J440 Chronic obstructive pulmonary disease with acute lower respiratory infection: Secondary | ICD-10-CM | POA: Diagnosis not present

## 2018-08-18 DIAGNOSIS — J189 Pneumonia, unspecified organism: Secondary | ICD-10-CM | POA: Diagnosis not present

## 2018-08-18 DIAGNOSIS — J9612 Chronic respiratory failure with hypercapnia: Secondary | ICD-10-CM | POA: Diagnosis not present

## 2018-08-18 DIAGNOSIS — J441 Chronic obstructive pulmonary disease with (acute) exacerbation: Secondary | ICD-10-CM | POA: Diagnosis not present

## 2018-08-18 DIAGNOSIS — I5032 Chronic diastolic (congestive) heart failure: Secondary | ICD-10-CM | POA: Diagnosis not present

## 2018-08-18 DIAGNOSIS — I130 Hypertensive heart and chronic kidney disease with heart failure and stage 1 through stage 4 chronic kidney disease, or unspecified chronic kidney disease: Secondary | ICD-10-CM | POA: Diagnosis not present

## 2018-08-18 DIAGNOSIS — N183 Chronic kidney disease, stage 3 (moderate): Secondary | ICD-10-CM | POA: Diagnosis not present

## 2018-08-18 DIAGNOSIS — E1122 Type 2 diabetes mellitus with diabetic chronic kidney disease: Secondary | ICD-10-CM | POA: Diagnosis not present

## 2018-08-18 DIAGNOSIS — J9611 Chronic respiratory failure with hypoxia: Secondary | ICD-10-CM | POA: Diagnosis not present

## 2018-08-19 ENCOUNTER — Other Ambulatory Visit: Payer: Self-pay

## 2018-08-19 DIAGNOSIS — J440 Chronic obstructive pulmonary disease with acute lower respiratory infection: Secondary | ICD-10-CM | POA: Diagnosis not present

## 2018-08-19 DIAGNOSIS — I5032 Chronic diastolic (congestive) heart failure: Secondary | ICD-10-CM | POA: Diagnosis not present

## 2018-08-19 DIAGNOSIS — N183 Chronic kidney disease, stage 3 (moderate): Secondary | ICD-10-CM | POA: Diagnosis not present

## 2018-08-19 DIAGNOSIS — J9612 Chronic respiratory failure with hypercapnia: Secondary | ICD-10-CM | POA: Diagnosis not present

## 2018-08-19 DIAGNOSIS — E1122 Type 2 diabetes mellitus with diabetic chronic kidney disease: Secondary | ICD-10-CM | POA: Diagnosis not present

## 2018-08-19 DIAGNOSIS — J441 Chronic obstructive pulmonary disease with (acute) exacerbation: Secondary | ICD-10-CM | POA: Diagnosis not present

## 2018-08-19 DIAGNOSIS — J9611 Chronic respiratory failure with hypoxia: Secondary | ICD-10-CM | POA: Diagnosis not present

## 2018-08-19 DIAGNOSIS — I130 Hypertensive heart and chronic kidney disease with heart failure and stage 1 through stage 4 chronic kidney disease, or unspecified chronic kidney disease: Secondary | ICD-10-CM | POA: Diagnosis not present

## 2018-08-19 DIAGNOSIS — J189 Pneumonia, unspecified organism: Secondary | ICD-10-CM | POA: Diagnosis not present

## 2018-08-19 NOTE — Patient Outreach (Signed)
[facility]) Care Management  08/19/2018  [name] 08/17/1941 [id number]   Initial outreach to patient regarding social work referral for transportation resources.  Patient's daughter typically takes him to medical appointments but he was interested in other transportation options if needed.   BSW educated him about [employer] which can be used for medical appointments within [employer].  BSW offered to mail him information about how to use this service but he declined due to the cost of $10 round trip. BSW also informed him of transportation benefit with [employer].  BSW mailed [employer] brochure and information on how to go about scheduling transport.  BSW will follow up within the next two weeks to ensure receipt of resources.   Ronn Melena, BSW Social Worker [phone]

## 2018-08-22 DIAGNOSIS — J441 Chronic obstructive pulmonary disease with (acute) exacerbation: Secondary | ICD-10-CM | POA: Diagnosis not present

## 2018-08-22 DIAGNOSIS — J189 Pneumonia, unspecified organism: Secondary | ICD-10-CM | POA: Diagnosis not present

## 2018-08-22 DIAGNOSIS — I5032 Chronic diastolic (congestive) heart failure: Secondary | ICD-10-CM | POA: Diagnosis not present

## 2018-08-22 DIAGNOSIS — J9611 Chronic respiratory failure with hypoxia: Secondary | ICD-10-CM | POA: Diagnosis not present

## 2018-08-22 DIAGNOSIS — I130 Hypertensive heart and chronic kidney disease with heart failure and stage 1 through stage 4 chronic kidney disease, or unspecified chronic kidney disease: Secondary | ICD-10-CM | POA: Diagnosis not present

## 2018-08-22 DIAGNOSIS — E1122 Type 2 diabetes mellitus with diabetic chronic kidney disease: Secondary | ICD-10-CM | POA: Diagnosis not present

## 2018-08-22 DIAGNOSIS — J9612 Chronic respiratory failure with hypercapnia: Secondary | ICD-10-CM | POA: Diagnosis not present

## 2018-08-22 DIAGNOSIS — N183 Chronic kidney disease, stage 3 (moderate): Secondary | ICD-10-CM | POA: Diagnosis not present

## 2018-08-22 DIAGNOSIS — J440 Chronic obstructive pulmonary disease with acute lower respiratory infection: Secondary | ICD-10-CM | POA: Diagnosis not present

## 2018-08-25 DIAGNOSIS — J441 Chronic obstructive pulmonary disease with (acute) exacerbation: Secondary | ICD-10-CM | POA: Diagnosis not present

## 2018-08-25 DIAGNOSIS — I5032 Chronic diastolic (congestive) heart failure: Secondary | ICD-10-CM | POA: Diagnosis not present

## 2018-08-25 DIAGNOSIS — E1122 Type 2 diabetes mellitus with diabetic chronic kidney disease: Secondary | ICD-10-CM | POA: Diagnosis not present

## 2018-08-25 DIAGNOSIS — N183 Chronic kidney disease, stage 3 (moderate): Secondary | ICD-10-CM | POA: Diagnosis not present

## 2018-08-25 DIAGNOSIS — J9612 Chronic respiratory failure with hypercapnia: Secondary | ICD-10-CM | POA: Diagnosis not present

## 2018-08-25 DIAGNOSIS — J440 Chronic obstructive pulmonary disease with acute lower respiratory infection: Secondary | ICD-10-CM | POA: Diagnosis not present

## 2018-08-25 DIAGNOSIS — J189 Pneumonia, unspecified organism: Secondary | ICD-10-CM | POA: Diagnosis not present

## 2018-08-25 DIAGNOSIS — J9611 Chronic respiratory failure with hypoxia: Secondary | ICD-10-CM | POA: Diagnosis not present

## 2018-08-25 DIAGNOSIS — I130 Hypertensive heart and chronic kidney disease with heart failure and stage 1 through stage 4 chronic kidney disease, or unspecified chronic kidney disease: Secondary | ICD-10-CM | POA: Diagnosis not present

## 2018-08-26 DIAGNOSIS — J90 Pleural effusion, not elsewhere classified: Secondary | ICD-10-CM | POA: Diagnosis not present

## 2018-08-26 DIAGNOSIS — J45909 Unspecified asthma, uncomplicated: Secondary | ICD-10-CM | POA: Diagnosis not present

## 2018-08-26 DIAGNOSIS — R0609 Other forms of dyspnea: Secondary | ICD-10-CM | POA: Diagnosis not present

## 2018-08-26 DIAGNOSIS — R0689 Other abnormalities of breathing: Secondary | ICD-10-CM | POA: Diagnosis not present

## 2018-08-26 DIAGNOSIS — J449 Chronic obstructive pulmonary disease, unspecified: Secondary | ICD-10-CM | POA: Diagnosis not present

## 2018-08-26 DIAGNOSIS — J440 Chronic obstructive pulmonary disease with acute lower respiratory infection: Secondary | ICD-10-CM | POA: Diagnosis not present

## 2018-08-29 ENCOUNTER — Encounter: Payer: Self-pay | Admitting: *Deleted

## 2018-08-29 ENCOUNTER — Other Ambulatory Visit: Payer: Self-pay | Admitting: *Deleted

## 2018-08-29 NOTE — Patient Outreach (Signed)
[facility] [facility]) [place] Telephone Outreach PCP office completes Transition of Care outreach post-hospital discharge Post-hospital discharge day # 33  08/29/2018  Michael Wright Jan 26, 1941 [id number]  Successful telephone outreach to [facility], 77 y/o male referred to [place] inpatient RN CM afterrecent hospitalization November 25-27, 2019 for shortness of breath and COPD exacerbation.Patient was discharged home to self-care with home health services in place [facility] for PT, RN, bath aide, and CSW. Patient has history including, but not limited to, COPD; CHF; BPH; HTN/ HLD; previous TIA.HIPAA/ identity verified with patient during phone call today.  Today, patient reports that he "is just fine" and reports that he has "had not any problems" since our last phone conversation; he denies pain and new/ recent falls today, and sounds to be in no distress throughout phone call today.  Patient further reports:  -- recent provider appointment on Friday 08/26/18, although he is unsure what the appointment was for/ or with whom; states appointment was "arranged by Dr. Raul Del, pulmonary provider.  Reports that he was transported to appointment by caregiver/ daughter [name], who attended appointment with him.  [name] is not present at patient's home today to provide additional information.  Patient reports "appointment went fine."  -- confirms that daughter/ caregiver spoke with [facility] BSW around transportation resources in his area; patient unsure if caregiver has received information in mail yet around resources; confirms that he is not interested in [facility], as there is a fee associated with their services; encouraged patient to follow up on transportation services available through his insurance provider, and he stated that his daughter/ caregiver would do  -- home health [facility]) services remain active; reviewed with patient recent  home health team visits, and encouraged his ongoing active participation while services last.  Patient reports that he believes home health team has contacted his PCP for an extension of services, but is not sure how long these services will last  --continues usingO2 at home at 3- 4L/min "all the time;"denies concerns/ issues around breathing status, stating that he "has been breathing real good."  He denies recent concerns around his breathing status, and we discussed signs/ symptoms COPD exacerbation and reasons patient would seek emergent/ urgent care vs. contact his care providers for prompt appointment; patient is able to re-verbalize signs/ symptoms concerning for MI/ CVA/ and COPD exacerbation  Patient deniesfurther issues, concerns, or problems today.Iconfirmed that patient/ caregiverhavemy direct phone number, the main [facility] CM office phone number, and the [facility] CM 24-hour nurse advice phone number should issues arise prior to next scheduled [place] outreach by phone within next 3-4 weeks. Encouraged patient to contact me directly if needs, questions, issues, or concerns arise prior to next scheduled outreach; patient/ caregiveragreed to do so.  Plan:  Patient will take medications as prescribed and will attend all scheduled provider appointments  Patientor caregiverwill promptly notify care providers for any new concerns/ issues/ problems that arise  Patient will actively participate in home health services as ordered post-hospital discharge  Patient/ caregiver will actively engage with and communicate with[facility] CSW around resources for transportation; referral placed today  I will mail printed educational material to patient around AD planning and COPD exacerbation/ action plan  [facility] CM outreach to continue with scheduled phone callwithin 4 weeks  [facility] CM Care Plan Problem One     Most Recent Value  Care Plan Problem One  High risk for hospital  readmission, related to/ as evidenced  by recent hospitalization November 25-27, 2019 for COPD exacerbation  Role Documenting the Problem One  Care Management Coordinator  Care Plan for Problem One  Not Active  [facility] Long Term Goal   Over the next 31 days, patient will not experience hospital readmission, as evidenced by patient reporting and review of EMR during [place] RN CM outreach  [place] Term Goal Start Date  07/29/18  [facility] Long Term Goal Met Date  08/29/18 [Goal Met]  Interventions for Problem One Long Term Goal  Confirmed with patient that he has not experienced hospital readmission post- last hospital discharge  [facility] CM Short Term Goal #1   Over the next 30 days, patient will actively participate in home health services as ordered post-hospital discharge, as evidenced by patient reporting and collaboration as indicated during [place] outreach  [facility] CM Short Term Goal #1 Start Date  07/29/18  [facility] CM Short Term Goal #1 Met Date  08/29/18 [Goal Met]  Interventions for Short Term Goal #1  Confirmed with patient that he has continued actively participating in home health services as ordered post-hospital discharge,  encouraged patient's ongoing active participation while services remain in place  [facility] CM Short Term Goal #3  Over the next 14 days, patient will discuss transportation options with [facility] CSW as evidenced by patient reporting and collaboration with [facility] CSW as indicated during [place] outreach  [facility] CM Short Term Goal #3 Start Date  08/17/18  [facility] CM Short Term Goal #3 Met Date  08/29/18- Goal Met  Interventions for Short Tern Goal #3  Confirmed that patient's caregiver has spoken to [facility] BSW around community resource options around transportation    [facility] CM Care Plan Problem Two     Most Recent Value  Care Plan Problem Two  Ongoing self-health management of chronic disease state of COPD, as evidenced by patient reporting during [facility] RN CCM outreach  Role Documenting the Problem Two   Care Management Coordinator  Care Plan for Problem Two  Active  Interventions for Problem Two Long Term Goal   Discussed with patient his current understanding of signs/ symptoms concerning for COPD exacerbation and reasons to contact care providers vs. seek emergent/ urgent care,  discussed signs/ symptoms MI/ CVA and action plans for signs/ symptoms MI/ CVA/ COPD exacerbation  [facility] Long Term Goal  Over the next 60 days, patient will be able to verbalize signs/ symptoms along with corresponding action plan for COPD exacerbation, as evidenced by patient reporting during [place] outreach  [facility] Long Term Goal Start Date  08/29/18     Oneta Rack, RN, BSN, [employer] Coordinator [facility] Care Management  [phone]

## 2018-08-31 DIAGNOSIS — N183 Chronic kidney disease, stage 3 (moderate): Secondary | ICD-10-CM | POA: Diagnosis not present

## 2018-08-31 DIAGNOSIS — J441 Chronic obstructive pulmonary disease with (acute) exacerbation: Secondary | ICD-10-CM | POA: Diagnosis not present

## 2018-08-31 DIAGNOSIS — I5032 Chronic diastolic (congestive) heart failure: Secondary | ICD-10-CM | POA: Diagnosis not present

## 2018-08-31 DIAGNOSIS — E1122 Type 2 diabetes mellitus with diabetic chronic kidney disease: Secondary | ICD-10-CM | POA: Diagnosis not present

## 2018-08-31 DIAGNOSIS — J189 Pneumonia, unspecified organism: Secondary | ICD-10-CM | POA: Diagnosis not present

## 2018-08-31 DIAGNOSIS — I130 Hypertensive heart and chronic kidney disease with heart failure and stage 1 through stage 4 chronic kidney disease, or unspecified chronic kidney disease: Secondary | ICD-10-CM | POA: Diagnosis not present

## 2018-08-31 DIAGNOSIS — J440 Chronic obstructive pulmonary disease with acute lower respiratory infection: Secondary | ICD-10-CM | POA: Diagnosis not present

## 2018-08-31 DIAGNOSIS — J9612 Chronic respiratory failure with hypercapnia: Secondary | ICD-10-CM | POA: Diagnosis not present

## 2018-08-31 DIAGNOSIS — J9611 Chronic respiratory failure with hypoxia: Secondary | ICD-10-CM | POA: Diagnosis not present

## 2018-09-02 ENCOUNTER — Other Ambulatory Visit: Payer: Self-pay

## 2018-09-02 DIAGNOSIS — I5032 Chronic diastolic (congestive) heart failure: Secondary | ICD-10-CM | POA: Diagnosis not present

## 2018-09-02 DIAGNOSIS — N183 Chronic kidney disease, stage 3 (moderate): Secondary | ICD-10-CM | POA: Diagnosis not present

## 2018-09-02 DIAGNOSIS — J9611 Chronic respiratory failure with hypoxia: Secondary | ICD-10-CM | POA: Diagnosis not present

## 2018-09-02 DIAGNOSIS — J441 Chronic obstructive pulmonary disease with (acute) exacerbation: Secondary | ICD-10-CM | POA: Diagnosis not present

## 2018-09-02 DIAGNOSIS — J9612 Chronic respiratory failure with hypercapnia: Secondary | ICD-10-CM | POA: Diagnosis not present

## 2018-09-02 DIAGNOSIS — J189 Pneumonia, unspecified organism: Secondary | ICD-10-CM | POA: Diagnosis not present

## 2018-09-02 DIAGNOSIS — E1122 Type 2 diabetes mellitus with diabetic chronic kidney disease: Secondary | ICD-10-CM | POA: Diagnosis not present

## 2018-09-02 DIAGNOSIS — I130 Hypertensive heart and chronic kidney disease with heart failure and stage 1 through stage 4 chronic kidney disease, or unspecified chronic kidney disease: Secondary | ICD-10-CM | POA: Diagnosis not present

## 2018-09-02 DIAGNOSIS — J440 Chronic obstructive pulmonary disease with acute lower respiratory infection: Secondary | ICD-10-CM | POA: Diagnosis not present

## 2018-09-02 NOTE — Patient Outreach (Signed)
[place]) Care Management  09/02/2018  Jaiven L Hebner 1941-08-18 [id number]   Follow up call to Mr. Isaacson to ensure receipt of [employer] information mailed on 08/19/18.  Mr. Hippe said that he has not received this information.  BSW inquired about sending it to his daughter via email but he was unsure of her address.  BSW will mail information again.  Will follow up within the next two weeks to ensure receipt.  Ronn Melena, BSW Social Worker [phone]

## 2018-09-05 DIAGNOSIS — G4733 Obstructive sleep apnea (adult) (pediatric): Secondary | ICD-10-CM | POA: Diagnosis not present

## 2018-09-05 DIAGNOSIS — J449 Chronic obstructive pulmonary disease, unspecified: Secondary | ICD-10-CM | POA: Diagnosis not present

## 2018-09-05 DIAGNOSIS — R0609 Other forms of dyspnea: Secondary | ICD-10-CM | POA: Diagnosis not present

## 2018-09-06 DIAGNOSIS — J9611 Chronic respiratory failure with hypoxia: Secondary | ICD-10-CM | POA: Diagnosis not present

## 2018-09-06 DIAGNOSIS — J441 Chronic obstructive pulmonary disease with (acute) exacerbation: Secondary | ICD-10-CM | POA: Diagnosis not present

## 2018-09-06 DIAGNOSIS — J9612 Chronic respiratory failure with hypercapnia: Secondary | ICD-10-CM | POA: Diagnosis not present

## 2018-09-06 DIAGNOSIS — J440 Chronic obstructive pulmonary disease with acute lower respiratory infection: Secondary | ICD-10-CM | POA: Diagnosis not present

## 2018-09-06 DIAGNOSIS — I5032 Chronic diastolic (congestive) heart failure: Secondary | ICD-10-CM | POA: Diagnosis not present

## 2018-09-06 DIAGNOSIS — N183 Chronic kidney disease, stage 3 (moderate): Secondary | ICD-10-CM | POA: Diagnosis not present

## 2018-09-06 DIAGNOSIS — E1122 Type 2 diabetes mellitus with diabetic chronic kidney disease: Secondary | ICD-10-CM | POA: Diagnosis not present

## 2018-09-06 DIAGNOSIS — J189 Pneumonia, unspecified organism: Secondary | ICD-10-CM | POA: Diagnosis not present

## 2018-09-06 DIAGNOSIS — I130 Hypertensive heart and chronic kidney disease with heart failure and stage 1 through stage 4 chronic kidney disease, or unspecified chronic kidney disease: Secondary | ICD-10-CM | POA: Diagnosis not present

## 2018-09-08 DIAGNOSIS — R52 Pain, unspecified: Secondary | ICD-10-CM | POA: Diagnosis not present

## 2018-09-08 DIAGNOSIS — J9612 Chronic respiratory failure with hypercapnia: Secondary | ICD-10-CM | POA: Diagnosis not present

## 2018-09-08 DIAGNOSIS — E1122 Type 2 diabetes mellitus with diabetic chronic kidney disease: Secondary | ICD-10-CM | POA: Diagnosis not present

## 2018-09-08 DIAGNOSIS — J441 Chronic obstructive pulmonary disease with (acute) exacerbation: Secondary | ICD-10-CM | POA: Diagnosis not present

## 2018-09-08 DIAGNOSIS — J440 Chronic obstructive pulmonary disease with acute lower respiratory infection: Secondary | ICD-10-CM | POA: Diagnosis not present

## 2018-09-08 DIAGNOSIS — I130 Hypertensive heart and chronic kidney disease with heart failure and stage 1 through stage 4 chronic kidney disease, or unspecified chronic kidney disease: Secondary | ICD-10-CM | POA: Diagnosis not present

## 2018-09-08 DIAGNOSIS — J189 Pneumonia, unspecified organism: Secondary | ICD-10-CM | POA: Diagnosis not present

## 2018-09-08 DIAGNOSIS — I5032 Chronic diastolic (congestive) heart failure: Secondary | ICD-10-CM | POA: Diagnosis not present

## 2018-09-08 DIAGNOSIS — J9611 Chronic respiratory failure with hypoxia: Secondary | ICD-10-CM | POA: Diagnosis not present

## 2018-09-08 DIAGNOSIS — N183 Chronic kidney disease, stage 3 (moderate): Secondary | ICD-10-CM | POA: Diagnosis not present

## 2018-09-09 DIAGNOSIS — J9611 Chronic respiratory failure with hypoxia: Secondary | ICD-10-CM | POA: Diagnosis not present

## 2018-09-09 DIAGNOSIS — J440 Chronic obstructive pulmonary disease with acute lower respiratory infection: Secondary | ICD-10-CM | POA: Diagnosis not present

## 2018-09-09 DIAGNOSIS — N183 Chronic kidney disease, stage 3 (moderate): Secondary | ICD-10-CM | POA: Diagnosis not present

## 2018-09-09 DIAGNOSIS — I130 Hypertensive heart and chronic kidney disease with heart failure and stage 1 through stage 4 chronic kidney disease, or unspecified chronic kidney disease: Secondary | ICD-10-CM | POA: Diagnosis not present

## 2018-09-09 DIAGNOSIS — I5032 Chronic diastolic (congestive) heart failure: Secondary | ICD-10-CM | POA: Diagnosis not present

## 2018-09-09 DIAGNOSIS — J189 Pneumonia, unspecified organism: Secondary | ICD-10-CM | POA: Diagnosis not present

## 2018-09-09 DIAGNOSIS — J441 Chronic obstructive pulmonary disease with (acute) exacerbation: Secondary | ICD-10-CM | POA: Diagnosis not present

## 2018-09-09 DIAGNOSIS — E1122 Type 2 diabetes mellitus with diabetic chronic kidney disease: Secondary | ICD-10-CM | POA: Diagnosis not present

## 2018-09-09 DIAGNOSIS — J9612 Chronic respiratory failure with hypercapnia: Secondary | ICD-10-CM | POA: Diagnosis not present

## 2018-09-12 ENCOUNTER — Ambulatory Visit: Payer: Self-pay

## 2018-09-12 DIAGNOSIS — J441 Chronic obstructive pulmonary disease with (acute) exacerbation: Secondary | ICD-10-CM | POA: Diagnosis not present

## 2018-09-12 DIAGNOSIS — N183 Chronic kidney disease, stage 3 (moderate): Secondary | ICD-10-CM | POA: Diagnosis not present

## 2018-09-12 DIAGNOSIS — J9611 Chronic respiratory failure with hypoxia: Secondary | ICD-10-CM | POA: Diagnosis not present

## 2018-09-12 DIAGNOSIS — J440 Chronic obstructive pulmonary disease with acute lower respiratory infection: Secondary | ICD-10-CM | POA: Diagnosis not present

## 2018-09-12 DIAGNOSIS — I130 Hypertensive heart and chronic kidney disease with heart failure and stage 1 through stage 4 chronic kidney disease, or unspecified chronic kidney disease: Secondary | ICD-10-CM | POA: Diagnosis not present

## 2018-09-12 DIAGNOSIS — I5032 Chronic diastolic (congestive) heart failure: Secondary | ICD-10-CM | POA: Diagnosis not present

## 2018-09-12 DIAGNOSIS — E1122 Type 2 diabetes mellitus with diabetic chronic kidney disease: Secondary | ICD-10-CM | POA: Diagnosis not present

## 2018-09-12 DIAGNOSIS — J189 Pneumonia, unspecified organism: Secondary | ICD-10-CM | POA: Diagnosis not present

## 2018-09-12 DIAGNOSIS — J9612 Chronic respiratory failure with hypercapnia: Secondary | ICD-10-CM | POA: Diagnosis not present

## 2018-09-14 ENCOUNTER — Other Ambulatory Visit: Payer: Self-pay

## 2018-09-14 DIAGNOSIS — J9611 Chronic respiratory failure with hypoxia: Secondary | ICD-10-CM | POA: Diagnosis not present

## 2018-09-14 DIAGNOSIS — J189 Pneumonia, unspecified organism: Secondary | ICD-10-CM | POA: Diagnosis not present

## 2018-09-14 DIAGNOSIS — I5032 Chronic diastolic (congestive) heart failure: Secondary | ICD-10-CM | POA: Diagnosis not present

## 2018-09-14 DIAGNOSIS — J441 Chronic obstructive pulmonary disease with (acute) exacerbation: Secondary | ICD-10-CM | POA: Diagnosis not present

## 2018-09-14 DIAGNOSIS — J9612 Chronic respiratory failure with hypercapnia: Secondary | ICD-10-CM | POA: Diagnosis not present

## 2018-09-14 DIAGNOSIS — I130 Hypertensive heart and chronic kidney disease with heart failure and stage 1 through stage 4 chronic kidney disease, or unspecified chronic kidney disease: Secondary | ICD-10-CM | POA: Diagnosis not present

## 2018-09-14 DIAGNOSIS — N183 Chronic kidney disease, stage 3 (moderate): Secondary | ICD-10-CM | POA: Diagnosis not present

## 2018-09-14 DIAGNOSIS — J440 Chronic obstructive pulmonary disease with acute lower respiratory infection: Secondary | ICD-10-CM | POA: Diagnosis not present

## 2018-09-14 DIAGNOSIS — E1122 Type 2 diabetes mellitus with diabetic chronic kidney disease: Secondary | ICD-10-CM | POA: Diagnosis not present

## 2018-09-14 NOTE — Patient Outreach (Signed)
[place] [facility]) Care Management  09/14/2018  Michael Wright December 18, 1940 [id number]   Follow up call to patient to ensure receipt of [employer] transportation documentation that was mailed to him for a second time on 09/02/18.  Patient confirmed that he did receive it this time.  He said that he has not utilized the service yet but verbalized understanding of how to do so.  BSW is closing case at this time.   Ronn Melena, BSW Social Worker [phone]

## 2018-09-19 ENCOUNTER — Other Ambulatory Visit: Payer: Self-pay | Admitting: *Deleted

## 2018-09-19 ENCOUNTER — Encounter: Payer: Self-pay | Admitting: *Deleted

## 2018-09-19 DIAGNOSIS — J9612 Chronic respiratory failure with hypercapnia: Secondary | ICD-10-CM | POA: Diagnosis not present

## 2018-09-19 DIAGNOSIS — J9611 Chronic respiratory failure with hypoxia: Secondary | ICD-10-CM | POA: Diagnosis not present

## 2018-09-19 DIAGNOSIS — J440 Chronic obstructive pulmonary disease with acute lower respiratory infection: Secondary | ICD-10-CM | POA: Diagnosis not present

## 2018-09-19 DIAGNOSIS — J189 Pneumonia, unspecified organism: Secondary | ICD-10-CM | POA: Diagnosis not present

## 2018-09-19 DIAGNOSIS — N183 Chronic kidney disease, stage 3 (moderate): Secondary | ICD-10-CM | POA: Diagnosis not present

## 2018-09-19 DIAGNOSIS — J441 Chronic obstructive pulmonary disease with (acute) exacerbation: Secondary | ICD-10-CM | POA: Diagnosis not present

## 2018-09-19 DIAGNOSIS — E1122 Type 2 diabetes mellitus with diabetic chronic kidney disease: Secondary | ICD-10-CM | POA: Diagnosis not present

## 2018-09-19 DIAGNOSIS — I5032 Chronic diastolic (congestive) heart failure: Secondary | ICD-10-CM | POA: Diagnosis not present

## 2018-09-19 DIAGNOSIS — I130 Hypertensive heart and chronic kidney disease with heart failure and stage 1 through stage 4 chronic kidney disease, or unspecified chronic kidney disease: Secondary | ICD-10-CM | POA: Diagnosis not present

## 2018-09-19 NOTE — Patient Outreach (Signed)
[facility] [facility]) [place] Telephone Outreach PCP office completes Transition of Care follow up post-hospital discharge Post-hospital discharge day # 54 Unsuccessful consecutive telephone outreach attempt # 1- engaged patient  09/19/2018  Michael Wright [facility] 1941/05/08 [id number]  11:00 am:  Unsuccessful telephone outreach to [facility], 78 y/o male referred to [place] inpatient RN CM afterrecent hospitalization November 25-27, 2019 for shortness of breath and COPD exacerbation.Patient was discharged home to self-care with home health services in place [facility] for PT, RN, bath aide, and CSW. Patient has history including, but not limited to, COPD; CHF; BPH; HTN/ HLD; previous TIA.  HIPAA compliant voice mail message left for patient, requesting return call back.  Plan:  Will re-attempt [place] telephone outreach later this week if I do not hear back from patient first.  Oneta Rack, RN, BSN, [place] Coordinator [facility] Care Management  [phone]

## 2018-09-20 DIAGNOSIS — N183 Chronic kidney disease, stage 3 (moderate): Secondary | ICD-10-CM | POA: Diagnosis not present

## 2018-09-20 DIAGNOSIS — Z794 Long term (current) use of insulin: Secondary | ICD-10-CM | POA: Diagnosis not present

## 2018-09-20 DIAGNOSIS — E1165 Type 2 diabetes mellitus with hyperglycemia: Secondary | ICD-10-CM | POA: Diagnosis not present

## 2018-09-20 DIAGNOSIS — E119 Type 2 diabetes mellitus without complications: Secondary | ICD-10-CM | POA: Diagnosis not present

## 2018-09-20 DIAGNOSIS — E1159 Type 2 diabetes mellitus with other circulatory complications: Secondary | ICD-10-CM | POA: Diagnosis not present

## 2018-09-20 DIAGNOSIS — I10 Essential (primary) hypertension: Secondary | ICD-10-CM | POA: Diagnosis not present

## 2018-09-20 DIAGNOSIS — E782 Mixed hyperlipidemia: Secondary | ICD-10-CM | POA: Diagnosis not present

## 2018-09-20 DIAGNOSIS — E669 Obesity, unspecified: Secondary | ICD-10-CM | POA: Diagnosis not present

## 2018-09-21 ENCOUNTER — Other Ambulatory Visit: Payer: Self-pay | Admitting: *Deleted

## 2018-09-21 ENCOUNTER — Encounter: Payer: Self-pay | Admitting: *Deleted

## 2018-09-21 DIAGNOSIS — J189 Pneumonia, unspecified organism: Secondary | ICD-10-CM | POA: Diagnosis not present

## 2018-09-21 DIAGNOSIS — J440 Chronic obstructive pulmonary disease with acute lower respiratory infection: Secondary | ICD-10-CM | POA: Diagnosis not present

## 2018-09-21 DIAGNOSIS — I130 Hypertensive heart and chronic kidney disease with heart failure and stage 1 through stage 4 chronic kidney disease, or unspecified chronic kidney disease: Secondary | ICD-10-CM | POA: Diagnosis not present

## 2018-09-21 DIAGNOSIS — E1122 Type 2 diabetes mellitus with diabetic chronic kidney disease: Secondary | ICD-10-CM | POA: Diagnosis not present

## 2018-09-21 DIAGNOSIS — N183 Chronic kidney disease, stage 3 (moderate): Secondary | ICD-10-CM | POA: Diagnosis not present

## 2018-09-21 DIAGNOSIS — J441 Chronic obstructive pulmonary disease with (acute) exacerbation: Secondary | ICD-10-CM | POA: Diagnosis not present

## 2018-09-21 DIAGNOSIS — I5032 Chronic diastolic (congestive) heart failure: Secondary | ICD-10-CM | POA: Diagnosis not present

## 2018-09-21 DIAGNOSIS — J9611 Chronic respiratory failure with hypoxia: Secondary | ICD-10-CM | POA: Diagnosis not present

## 2018-09-21 DIAGNOSIS — J9612 Chronic respiratory failure with hypercapnia: Secondary | ICD-10-CM | POA: Diagnosis not present

## 2018-09-21 NOTE — Patient Outreach (Signed)
[place] [facility]) [place] Telephone Outreach PCP office completes Transition of Care outreach post-hospital discharge Post-hospital discharged day # 56  09/21/2018  Delvecchio L Azimi 10/29/1940 [id number]  Successful telephone outreach to [facility], 78 y/o male referred to [place] inpatient RN CM afterrecent hospitalization November 25-27, 2019 for shortness of breath and COPD exacerbation.Patient was discharged home to self-care with home health services in place through [facility] for PT, RN, bath aide, and CSW. Patient has history including, but not limited to, COPD; CHF; BPH; HTN/ HLD; previous TIA.HIPAA/ identity verified with patient during phone call today.  Today, patient reports that he "is doing about the same" and reports that he has recently developed new (L) knee pain and has visited with his PCP to address; states that he is managing pain by using tylenol and Vicks vapo-rub; states that no x-rays were performed at time of recent PCP office visit.  Patient is unable to state plan of care to address new pain- I encouraged patient to contact PCP for further evaluation if his pain worsens/ does not improve with treatment he is currently using, and patient verbalizes understanding and agreement; patient denies new/ recent falls and reports that he continues using cane for ambulation.  Patient sounds to be in no apparent distress throughout entirety of phone call today.  Patient further reports:  --recent provider appointment reviewed with patient today: ---- 09/05/2018: pulmonary provider office visit: discussed with patient changes made to lasix dosing; patient confirms that daughter attended appointment with him and continues managing his medications at home; states that daughter is aware of this change and that he knows she has adjusted medications as advised during recent appointment ---- 09/08/2018: PCP; states  depression was addressed and he does not wish to take medication for depression; patient reports that he also visited PCP on 09/15/2018 for new knee pain ---- 09/20/2018: endocrinology provider appointment; states that no changes were made to patient's overall plan of care and he is able to accurately verbalize dosing for HS insulin and metformin -- Upcoming eye appointment for evaluation of need for cataract removal: states he is unsure when this appointment is scheduled for, but knows that it is "soon;" states his initial plan was to have cataracts removed last fall, but his hospitalization prevented this from occuring  Confirms that his daughters continue providing transportation to and attending all provider appointments with him  -- home health (HH)services remain active;reviewed with patient recent home health team visits, and he reports today that this week is his last week with home health services; patient verbalizes understanding of process to take should he feel he needs extension of home health services, but he also states that he feels 'ready" to be released by home health team  --denies issues/ concerns around medications; endorses taking all medications as prescribed, daughter continues managing all patient medications and patient takes independently once prescribed  --now usingO2 at home at 2.5- 3L/min (decreased from previous 4 L/min post-hospital discharge), "all the time;"denies concerns/ issues around breathing status, stating that he "has been breathing fine."  Using teach back method, reviewed with patient signs/ symptoms COPD exacerbation/ yellow zone along with corresponding action plan; patient verbalizes a good understanding of signs/ symptoms COPD yellow zone, and admits that he continues to have decreased activity tolerance, although he reports this "is always how it is." -- monitors and records daily weights and verbalizes a good baseline understanding of rationale  behind daily weight monitoring and correspoding  action plan for weight gain guidelines in setting of CHF; patient reports that he uses "extra" lasix 20 mg dosing for any weight gain > 2 lbs over night.  Reports that daily weights have "been holding steady" between 218-219 lbs consistently post-hospital discharge -- DM: discussed with patient his recent visit with endocrinology provider; states that his DM is "under control," and he is able to verbalize accurate report of prescribed medications  Patient deniesfurther issues, concerns, or problems today.Iconfirmed that patient/ caregiverhavemy direct phone number, the main [facility] CM office phone number, and the [facility] CM 24-hour nurse advice phone number should issues arise prior to next scheduled [place] outreachby phone within next 3-4 weeks. Encouraged patient to contact me directly if needs, questions, issues, or concerns arise prior to next scheduled outreach by phone within next 3 weeks; patient/ caregiveragreed to do so.  Plan:  Patient will take medications as prescribed and will attend all scheduled provider appointments  Patientor caregiverwill promptly notify care providers for any new concerns/ issues/ problems that arise  Patient will continue reviewing previously provided educational material around COPD self-health management as indicated  Patient will continue monitoring and recording daily weights  [facility] Community CM outreach to continue with scheduled phone callwithin 3 weeks  [facility] CM Care Plan Problem Two     Most Recent Value  Care Plan Problem Two  Ongoing self-health management of chronic disease state of COPD, as evidenced by patient reporting during [facility] RN CCM outreach  Role Documenting the Problem Two  Care Management Coordinator  Care Plan for Problem Two  Active  Interventions for Problem Two Long Term Goal   Discussed with patient his current clinical condition and confirmed that he has no current  clinical concerns,  reviewed recent provider appointments with patient and confirmed that his daughter continues preparing and managing his medications for him,  discussed with patient previously provided printed educational materials re: COPD self-health management, and using teach back method reviewed with patient signs/ symptoms yellow zone for COPD/ CHF, along with corresponding action plans.  Encouraged patient to promptly notify care providers for ongoing or new issues/ concerns/problems,  discussed current use of nebulizer and home oxygen with patient  [facility] Long Term Goal  Over the next 60 days, patient will be able to verbalize signs/ symptoms along with corresponding action plan for COPD exacerbation, as evidenced by patient reporting during [place] outreach  [facility] Long Term Goal Start Date  08/29/18     Oneta Rack, RN, BSN, [place] Care Management  [phone]

## 2018-09-23 DIAGNOSIS — N183 Chronic kidney disease, stage 3 (moderate): Secondary | ICD-10-CM | POA: Diagnosis not present

## 2018-09-23 DIAGNOSIS — J9612 Chronic respiratory failure with hypercapnia: Secondary | ICD-10-CM | POA: Diagnosis not present

## 2018-09-23 DIAGNOSIS — E1122 Type 2 diabetes mellitus with diabetic chronic kidney disease: Secondary | ICD-10-CM | POA: Diagnosis not present

## 2018-09-23 DIAGNOSIS — J189 Pneumonia, unspecified organism: Secondary | ICD-10-CM | POA: Diagnosis not present

## 2018-09-23 DIAGNOSIS — I5032 Chronic diastolic (congestive) heart failure: Secondary | ICD-10-CM | POA: Diagnosis not present

## 2018-09-23 DIAGNOSIS — J440 Chronic obstructive pulmonary disease with acute lower respiratory infection: Secondary | ICD-10-CM | POA: Diagnosis not present

## 2018-09-23 DIAGNOSIS — J9611 Chronic respiratory failure with hypoxia: Secondary | ICD-10-CM | POA: Diagnosis not present

## 2018-09-23 DIAGNOSIS — I130 Hypertensive heart and chronic kidney disease with heart failure and stage 1 through stage 4 chronic kidney disease, or unspecified chronic kidney disease: Secondary | ICD-10-CM | POA: Diagnosis not present

## 2018-09-23 DIAGNOSIS — J441 Chronic obstructive pulmonary disease with (acute) exacerbation: Secondary | ICD-10-CM | POA: Diagnosis not present

## 2018-09-26 DIAGNOSIS — J45909 Unspecified asthma, uncomplicated: Secondary | ICD-10-CM | POA: Diagnosis not present

## 2018-09-26 DIAGNOSIS — J440 Chronic obstructive pulmonary disease with acute lower respiratory infection: Secondary | ICD-10-CM | POA: Diagnosis not present

## 2018-09-26 DIAGNOSIS — J449 Chronic obstructive pulmonary disease, unspecified: Secondary | ICD-10-CM | POA: Diagnosis not present

## 2018-09-26 DIAGNOSIS — R0689 Other abnormalities of breathing: Secondary | ICD-10-CM | POA: Diagnosis not present

## 2018-10-11 ENCOUNTER — Other Ambulatory Visit: Payer: Self-pay | Admitting: *Deleted

## 2018-10-11 ENCOUNTER — Encounter: Payer: Self-pay | Admitting: *Deleted

## 2018-10-11 NOTE — Patient Outreach (Signed)
[name] [facility]) Care Management [place]- hospital discharge day # 76  10/11/2018  Michael Wright November 22, 1940 [id number]  Successful telephone outreach to [facility], 78 y/o male referred to [place] inpatient RN CM afterrecent hospitalization November 25-27, 2019 for shortness of breath and COPD exacerbation.Patient was discharged home to self-care with home health services in place, which are now completed. Patient has history including, but not limited to, COPD; CHF; BPH; HTN/ HLD; previous TIA.HIPAA/ identity verified with patient during phone call today.  Today, patient reports that he "isdoing really pretty good"and reports ongoing (L) knee- leg pain that he "doesn't know what causes," but is controlled by use of Tylenol.  States "it comes and goes," and reports that although he shared this pain with his doctor/ PCP, he doesn't think "there is anything that can be done."  Denies upcoming scheduled follow up with care providers; encouraged patient to maintain contact with care providers and especially to contact them for re-evaluation should his pain became worse/ unmanageable; patient verbalizes agreement.  Denies new/ recent falls; states continues using cane when he goes out "if needed for a safety net;" states does not use cane when he is at home, stating "no need; I am steady at home."  Patient sounds to be in no obvious distress throughout phone call today.  Patient further reports:  -- no recent changes to medications; continues to report that daughter Michael Wright prepares his medications for him and he takes independently once prepared; states daughter continues administering his insulin every day; patient is able to verbalize plan for "extra" dose of lasix for weight gain > 2 lbs overnight; reports today that he has taken "extra" dose of lasix "only a time or two" since last [place] outreach -- no recent or upcoming  provider appointments- states will attend eye doctor next week for evaluation of possible cataract surgery in near future; Confirms that his daughters continue providing transportation to and attending all provider appointments with him -- home health services now completed --continues usingO2 at home at 2- 3L/min "all the time;"reports mainly using at 2 L/min consistently; denies concerns/ issues around breathing status, stating that he "feels like (he) is breathing fine." Using teach back method, again reviewed and reiterated with patient signs/ symptoms COPD exacerbation/ yellow zone along with corresponding action plan; patient verbalizes a good general understanding of signs/ symptoms COPD yellow zone, however, he does require prompting, stating that he "can't remember all of this."  Denies current signs/ symptoms COPD exacerbation, but continues to admit to ongoing/ baseline decreased activity tolerance -- monitors and records daily weights and continues to verbalize a good baseline understanding of rationale behind daily weight monitoring and correspoding action plan for weight gain guidelines in setting of CHF; patient reports that he uses "extra" lasix for any weight gain > 2 lbs over night.  Reports that daily weight ranges have consistently been between 215--221 lbs with weight yesterday of 218 lbs and today of 217 lbs.  Patient stated that he is thinking about getting a new set of scales- I encouraged patient to contact his insurance provider to see if they provide scales for him; and he stated that he has been in recent contact with insurance provider around coverage of his CBG test strips, and states that he will inquire about scales as well once his daughter is able to be with him to place call.  -- continues to report "blood sugars doing much better;" states  fasting blood sugars have been running "mainly between 90-120." denies issues/ concerns around insulin/ blood sugars- positive  reinforcement provided  Patient deniesfurther issues, concerns, or problems today.Iconfirmed that patient/ caregiverhavemy direct phone number, the main [facility] CM office phone number, and the [facility] CM 24-hour nurse advice phone number should issues arise prior to next scheduled [facility] Community CM outreachby phonein 2 weeks. Discussed with patient that he has thus far made good progress in meeting his previously established [place] CM goals, and we discussed the possibility of transfer to [place] at time of next outreach, and patient is agreeable to this today.  Encouraged patient to contact me directly if needs, questions, issues, or concerns arise prior to next scheduled outreach and patient agreed to do so.  Plan:  Patient will take medications as prescribed and will attend all scheduled provider appointments  Patientor caregiverwill promptly notify care providers for any new concerns/ issues/ problems that arise  Patient will continue reviewing previously provided educational material around COPD self-health management as indicated  Patient will continue monitoring and recording daily weights and following action plan for COPD/ CHF yellow zone/ weight gain  [facility] Community CM outreach to continue with scheduled phone callin 2weeks, for possible transfer to [place] Problem Two     Most Recent Value  Care Plan Problem Two  Ongoing self-health management of chronic disease state of COPD, as evidenced by patient reporting during [facility] RN CCM outreach  Role Documenting the Problem Two  Care Management Coordinator  Care Plan for Problem Two  Active  Interventions for Problem Two Long Term Goal   Using teach back method, reiterated with patient signs/ symptoms yellow COPD zone,  coached patient around signs/ symptoms concernning for COPD exacerbation,  discussed current use of home O2 with patient and confirmed that patient has not had recent  signs/ symptoms yellow COPD zone.  Reiterated with patient action plan for yellow COPD/ CHF zone and corresponding action plan  [facility] Long Term Goal  Over the next 60 days, patient will be able to verbalize signs/ symptoms along with corresponding action plan for COPD exacerbation, as evidenced by patient reporting during [place] outreach  [facility] Long Term Goal Start Date  08/29/18     Oneta Rack, RN, BSN, [place] Care Management  [phone]

## 2018-10-19 DIAGNOSIS — E119 Type 2 diabetes mellitus without complications: Secondary | ICD-10-CM | POA: Diagnosis not present

## 2018-10-26 ENCOUNTER — Encounter: Payer: Self-pay | Admitting: *Deleted

## 2018-10-26 ENCOUNTER — Other Ambulatory Visit: Payer: Self-pay | Admitting: *Deleted

## 2018-10-26 NOTE — Patient Outreach (Signed)
[place] [facility]) Care Management [place] discipline closure Post-hospital discharge day # [id number] Referral to [place]  10/26/2018  Michael Wright 1941-08-17 [id number]   Successful telephone outreach to [facility], 78 y/o male referred to [place] inpatient RN CM afterrecent hospitalization November 25-27, 2019 for shortness of breath and COPD exacerbation.Patient was discharged home to self-care with home health services in place, which are now completed. Patient has history including, but not limited to, COPD; CHF; BPH; HTN/ HLD; previous TIA.HIPAA/ identity verified with patient during phone call today.  Today, patient reports that he "isdoing pretty good, about the same as always"and reports ongoing chronic (L) knee- leg pain that continues to be well-controlled controlled by use of Tylenol.  Denies new/ recent falls; states continues using cane when he goes out "but not when at home."  Patient confirms that he continues living alone, with his daughters living close by with daily visits for insulin administration.  Patient sounds to be in no obvious distress during phone call today.  Patient further reports:  -- no recent changes to medications; continues to report that daughter [name] prepares his medications for him weekly and he takes independently once prepared; states daughter continues administering his insulin every day as well; patient continues able to verbalize plan for "extra" dose of lasix for weight gain > 2 lbs overnight; reports has not needed to take extra dose recently.    Denies concerns around his medications, but states that he continues unhappy about the cost of his test strips for daily fasting blood sugar monitoring; I questioned patient if he had contacted his insurance company for an explanation around the cost, as I had previously advised; patient reports that he has not  done so, and states that he does not like contacting them, "because it takes forever" to get someone on the phone; I questioned whether patient would like for me to attempt to contact while he is on phone, but he declines, stating that he doesn't have time with his eye doctor appointment this afternoon.  Patient also declined [place] referral to inquire about possibility of financial assistance for test strips.  Discussed with patient that as the year progresses, his out of pocket cost may decrease, but that he would need to discuss specific coverage details with insurance provider.  Patient stated he "would think about" contacting them "on his own," and by the end of our conversation states that he "does not have trouble affording" the test strips, but "just thinks they are too expensive."   -- no recent or upcoming provider appointments for medical care- states will attend eye doctor appointment this afternoon for evaluation of possible upcoming cataract surgery in near future; Confirms that his daughters continue providing transportation to and attending all provider appointments with him  --continuesusingO2 at home at2-2.5L/min"all the time;"reports mainly using at 2 L/min consistently; denies concerns/ issues around breathing status, stating that he believes he "is breathingfine."Using teach back method, again reviewed and reiterated with patientsigns/ symptoms COPD exacerbation/ yellow zone along with corresponding action plan; patient verbalizes a good general understanding of signs/ symptoms COPD yellow zone, however, he continues to require prompting/ reminding.  Continues to deny current signs/ symptoms COPD exacerbation, but continues to admit to ongoing/ baseline decreased activity tolerance, which he described as his baseline.  -- monitors and records daily weights and continues to verbalize a good understanding of rationale behind daily weight monitoring and corresponding  action  plan for weight gain guidelines in setting of CHF; reports that he has ordered a new scale that has an audio of the weight value, as he has had ongoing occasional difficulty in visualizing the weight reading on his current scales; reports that he expects scales to arrive this week.  Patient again stated that he did not contact his insurance provider as recommended to inquire about obtaining new scales, again stating that he "does not like dealing with" his insurance provider.  Reports daily weight ranges have consistently been between 214-216 lbs with weight yesterday and today of 216 lbs.     -- continues monitoring and recording daily fasting blood sugars and reports "blood sugars have been running 90-110."  Patient denies signs/ symptoms concerning for hypo-hyperglycemia  Patient deniesfurther issues, concerns, or problems today.Again, discussed with patient that he has thus far made good progress in meeting his previously established [place] CM goals, and possibility of transfer to [place] and patient is agreeable to this today.  Confirmed that patient/ caregiverhavemy direct phone number, the main [facility] CM office phone number, and the [facility] CM 24-hour nurse advice phone number should issues arise prior to [place] outreach.  Encouraged patient to actively engage with [place] once outreach is established, and patient agrees to do so.  Plan:  Patient will take medications as prescribed and will attend all scheduled provider appointments  Patientor caregiverwill promptly notify care providers for any new concerns/ issues/ problems that arise  Patient will continue reviewing previously provided educational material around COPD self-health management as indicated  Patient will continue monitoring and recording daily weights and following action plan for COPD/ CHF yellow zone/ weight gain  I will share today's [facility] CM notes/ care plan with  patient's PCP as quarterly update  I will place [place] referral, as patient has met his previously established [place] CM goals and verbalizes no further care coordination needs  [facility] CM Care Plan Problem Two     Most Recent Value  Care Plan Problem Two  Ongoing self-health management of chronic disease state of COPD, as evidenced by patient reporting during [facility] RN CCM outreach  Role Documenting the Problem Two  Care Management Coordinator  Care Plan for Problem Two  Active  Interventions for Problem Two Long Term Goal   Reiterated with patient previously provided education around signs/ symptoms COPD yellow zone/ exacerbation along with corresponding action plan,  discussed with patient his current use of home O2 and nebulizer,  confirmed that patient does not have ongoing clinical concerns around breathing  [facility] Long Term Goal  Over the next 60 days, patient will be able to verbalize signs/ symptoms along with corresponding action plan for COPD exacerbation, as evidenced by patient reporting during [place] outreach  [facility] Long Term Goal Start Date  08/29/18  [facility] Long Term Goal Met Date  10/26/18 [Goal Met]  [facility] CM Short Term Goal #1   Over the next 30 days, patient will verbalize action plan for COPD exacerbation/ yellow zone as evidenced by patient reporting during [place] outreach  [facility] CM Short Term Goal #1 Start Date  10/26/18  Interventions for Short Term Goal #2   Reiterated previously provided education around self-health management strategies for chronic disease state of COPD, including signs/ symptoms COPD yellow zone and corresponding action plan,  use of nebulizer and O2 at home,  encouraged patient to  continue monitoring and recording daily weights and reviewed weight gain guidlelines and action plan in setting of concurrent CHF,  discussed with patient referral to [place], and encouraged patient to actively engage with [facility] RN [facility] once  outreach is attempted,  placed referral to [place] health coach for ongoing suport and education around COPD self-health management     It has been a pleasure caring for Mr. Degan,  Oneta Rack, RN, BSN, [employer] [facility] Care Management  [phone]

## 2018-10-27 ENCOUNTER — Other Ambulatory Visit: Payer: Self-pay | Admitting: *Deleted

## 2018-10-27 DIAGNOSIS — J45909 Unspecified asthma, uncomplicated: Secondary | ICD-10-CM | POA: Diagnosis not present

## 2018-10-27 DIAGNOSIS — J440 Chronic obstructive pulmonary disease with acute lower respiratory infection: Secondary | ICD-10-CM | POA: Diagnosis not present

## 2018-10-27 DIAGNOSIS — J449 Chronic obstructive pulmonary disease, unspecified: Secondary | ICD-10-CM | POA: Diagnosis not present

## 2018-10-27 DIAGNOSIS — R0689 Other abnormalities of breathing: Secondary | ICD-10-CM | POA: Diagnosis not present

## 2018-11-08 DIAGNOSIS — F0390 Unspecified dementia without behavioral disturbance: Secondary | ICD-10-CM | POA: Diagnosis not present

## 2018-11-08 DIAGNOSIS — E782 Mixed hyperlipidemia: Secondary | ICD-10-CM | POA: Diagnosis not present

## 2018-11-08 DIAGNOSIS — M791 Myalgia, unspecified site: Secondary | ICD-10-CM | POA: Diagnosis not present

## 2018-11-08 DIAGNOSIS — G4700 Insomnia, unspecified: Secondary | ICD-10-CM | POA: Diagnosis not present

## 2018-11-08 DIAGNOSIS — R5383 Other fatigue: Secondary | ICD-10-CM | POA: Diagnosis not present

## 2018-11-08 DIAGNOSIS — G479 Sleep disorder, unspecified: Secondary | ICD-10-CM | POA: Diagnosis not present

## 2018-11-08 DIAGNOSIS — R197 Diarrhea, unspecified: Secondary | ICD-10-CM | POA: Diagnosis not present

## 2018-11-11 DIAGNOSIS — Z8673 Personal history of transient ischemic attack (TIA), and cerebral infarction without residual deficits: Secondary | ICD-10-CM | POA: Diagnosis not present

## 2018-11-11 DIAGNOSIS — E1122 Type 2 diabetes mellitus with diabetic chronic kidney disease: Secondary | ICD-10-CM | POA: Diagnosis not present

## 2018-11-11 DIAGNOSIS — E119 Type 2 diabetes mellitus without complications: Secondary | ICD-10-CM | POA: Diagnosis not present

## 2018-11-11 DIAGNOSIS — I130 Hypertensive heart and chronic kidney disease with heart failure and stage 1 through stage 4 chronic kidney disease, or unspecified chronic kidney disease: Secondary | ICD-10-CM | POA: Diagnosis not present

## 2018-11-11 DIAGNOSIS — N189 Chronic kidney disease, unspecified: Secondary | ICD-10-CM | POA: Diagnosis not present

## 2018-11-11 DIAGNOSIS — I10 Essential (primary) hypertension: Secondary | ICD-10-CM | POA: Diagnosis not present

## 2018-11-11 DIAGNOSIS — H52221 Regular astigmatism, right eye: Secondary | ICD-10-CM | POA: Diagnosis not present

## 2018-11-11 DIAGNOSIS — Z79899 Other long term (current) drug therapy: Secondary | ICD-10-CM | POA: Diagnosis not present

## 2018-11-11 DIAGNOSIS — H2511 Age-related nuclear cataract, right eye: Secondary | ICD-10-CM | POA: Diagnosis not present

## 2018-11-11 DIAGNOSIS — Z9981 Dependence on supplemental oxygen: Secondary | ICD-10-CM | POA: Diagnosis not present

## 2018-11-11 DIAGNOSIS — I509 Heart failure, unspecified: Secondary | ICD-10-CM | POA: Diagnosis not present

## 2018-11-11 DIAGNOSIS — H269 Unspecified cataract: Secondary | ICD-10-CM | POA: Diagnosis not present

## 2018-11-22 ENCOUNTER — Other Ambulatory Visit: Payer: Self-pay | Admitting: *Deleted

## 2018-11-22 NOTE — Patient Outreach (Signed)
[facility]) Care Management  11/22/2018   Michael Wright [facility] May 15, 1941 [id number]  RN Health Coach telephone call to patient.  Hipaa compliance verified. Per patient he is doing good. He is in the green zone. Patient uses O2 2-2.5 liters. Patient is taking medications as per ordered. Patient stated that he had cataract surgery and can see so much better out of the rt eye. Per patient he was scheduled to get the left done but it has been postponed due to the COVID-19 virus. Patient A1C is 10.1. Patient stated that he has started on insulin and his fasting blood sugar today is 102. Per patient he lives alone and mostly eats frozen dinners. Patient family does his grocery shopping and takes him for his Dr visits. Patient ambulates with a cane when he goes out. Patient does not have a medical alert . Patient has agreed to follow up outreach calls.   Current Medications:  Current Outpatient Medications  Medication Sig Dispense Refill  . allopurinol (ZYLOPRIM) 100 MG tablet Take 100 mg by mouth 2 (two) times daily.     Marland Kitchen aspirin EC 81 MG tablet Take 81 mg by mouth daily.    . carvedilol (COREG) 3.125 MG tablet Take 3.125 mg by mouth 2 (two) times daily with a meal.    . colchicine 0.6 MG tablet Take 0.6 mg by mouth 2 (two) times daily as needed (for gout flares).    . Cyanocobalamin 3000 MCG SUBL Place 3,000 mcg under the tongue daily at 12 noon.    . donepezil (ARICEPT) 10 MG tablet Take 10 mg by mouth daily.     . finasteride (PROSCAR) 5 MG tablet Take 1 tablet (5 mg total) by mouth daily. 30 tablet 2  . furosemide (LASIX) 40 MG tablet Take 1 tablet (40 mg total) by mouth daily. 30 tablet 3  . HYDROcodone-homatropine (HYCODAN) 5-1.5 MG/5ML syrup Take 3 mLs by mouth every 6 (six) hours as needed for cough. 120 mL 0  . insulin glargine (LANTUS) 100 unit/mL SOPN Inject 0.36 mLs (36 Units total) into the skin at bedtime. (Patient taking differently: Inject 42 Units into the skin at  bedtime. ) 15 mL 2  . ipratropium-albuterol (DUONEB) 0.5-2.5 (3) MG/3ML SOLN Take 3 mLs by nebulization every 6 (six) hours as needed (For wheezing and shortness of breath). 360 mL 1  . loratadine (CLARITIN) 10 MG tablet Take 10 mg by mouth daily.    . metFORMIN (GLUCOPHAGE-XR) 500 MG 24 hr tablet Take 500 mg by mouth 2 (two) times daily.    . OXYGEN Inhale into the lungs at bedtime as needed.    . pantoprazole (PROTONIX) 40 MG tablet Take 1 tablet (40 mg total) by mouth daily. 30 tablet 2  . potassium chloride 20 MEQ TBCR Take 20 mEq by mouth daily. While on lasix 30 tablet 2  . pravastatin (PRAVACHOL) 40 MG tablet Take 40 mg by mouth at bedtime.     . traZODone (DESYREL) 50 MG tablet Take 1 tablet (50 mg total) by mouth at bedtime. 30 tablet 2   No current facility-administered medications for this visit.     Functional Status:  In your present state of health, do you have any difficulty performing the following activities: 11/22/2018 08/02/2018  Hearing? N Y  Vision? Y Y  Comment patient has cataract that he needs surgery and is unable to see out of one eye at this time.  -  Difficulty concentrating or making decisions? N N  Walking or climbing stairs? Y Y  Dressing or bathing? Y Y  Doing errands, shopping? Tempie Donning  Preparing Food and eating ? Y N  Comment - Family assists  Using the Toilet? N N  In the past six months, have you accidently leaked urine? N N  Do you have problems with loss of bowel control? N N  Managing your Medications? Tempie Donning  Comment family assists  Family assists  Managing your Finances? Tempie Donning  Comment family Family assists  Housekeeping or managing your Housekeeping? Tempie Donning  Comment family Family assists  Some recent data might be hidden    Fall/Depression Screening: Fall Risk  11/22/2018 10/26/2018 10/11/2018  Falls in the past year? 0 (No Data) (No Data)  Comment - Denies new/ recent falls Denies new/ recent falls  Number falls in past yr: - - -  Injury with Fall? -  - -  Comment - - -  Risk for fall due to : - - -  Risk for fall due to: Comment - - -  Follow up - Falls prevention discussed Falls prevention discussed   PHQ 2/9 Scores 11/22/2018 08/02/2018  PHQ - 2 Score 2 2  PHQ- 9 Score 6 6   THN CM Care Plan Problem One     Most Recent Value  Care Plan Problem One  Knowledge Deficit in self management of COPD  Role Documenting the Problem One  [place] for Problem One  Active  THN Long Term Goal   Patient will not have any admissions within the next 90 days for COPD exacerbation  THN Long Term Goal Start Date  11/22/18  Interventions for Problem One Long Term Goal  RN discussed COPD exacerbation. RN discussed medication adherence. RN discussed COVID-19 precautions. RN will follow up with further discussion  THN CM Short Term Goal #1   Patient will verbalize keeping a record of blood sugars and weights within the next 30 days  THN CM Short Term Goal #1 Start Date  11/22/18  Interventions for Short Term Goal #1  RN discussed the importance of weighing for the CHF. RN discussed with patient about recording blood sugars to get a range for A1C. RN sent a 2020 Calenda book. RN will follow up for compliance and utlization  THN CM Short Term Goal #2   Patient will verbalize symptoms of hypo and hyperglycemia within the next 30 days  THN CM Short Term Goal #2 Start Date  11/22/18  Interventions for Short Term Goal #2  RN discussed hypo and hyperglycemia symptoms and action plan. RN will follow up with further discussion  THN CM Short Term Goal #3  Patient will verbalize eating healthy snacks within the next 30 days  THN CM Short Term Goal #3 Start Date  11/22/18  Interventions for Short Tern Goal #3  RN discussed eating healthy snacks. RN sent a list of healthy low carb snacks for patient to eat      Assessment:  COPD green zone Patient is weighing daily A1C 10.1 Patient is now on insulin Fasting blood sugar 102 Cataract Surgery 10/2018  rt eye Patient does not know the signs and symptoms of hyper and hypo glycemia  Plan:  RN discussed hypo and hyperglycemia RN sent picture sheet of hypo and hyperglycemia RN sent educational material on COPD exacerbation RN sent living well with Diabetes book RN sent a list of healthy low carb snacks RN sent 2020 Calendar book RN sent barriers letter  and assessment to PCP RN will follow up within the month of May  Andy Moye [facility] [phone]

## 2018-11-25 DIAGNOSIS — J440 Chronic obstructive pulmonary disease with acute lower respiratory infection: Secondary | ICD-10-CM | POA: Diagnosis not present

## 2018-11-25 DIAGNOSIS — J449 Chronic obstructive pulmonary disease, unspecified: Secondary | ICD-10-CM | POA: Diagnosis not present

## 2018-11-25 DIAGNOSIS — J45909 Unspecified asthma, uncomplicated: Secondary | ICD-10-CM | POA: Diagnosis not present

## 2018-11-25 DIAGNOSIS — R0689 Other abnormalities of breathing: Secondary | ICD-10-CM | POA: Diagnosis not present

## 2018-12-26 DIAGNOSIS — R0689 Other abnormalities of breathing: Secondary | ICD-10-CM | POA: Diagnosis not present

## 2018-12-26 DIAGNOSIS — J449 Chronic obstructive pulmonary disease, unspecified: Secondary | ICD-10-CM | POA: Diagnosis not present

## 2018-12-26 DIAGNOSIS — J45909 Unspecified asthma, uncomplicated: Secondary | ICD-10-CM | POA: Diagnosis not present

## 2018-12-26 DIAGNOSIS — J440 Chronic obstructive pulmonary disease with acute lower respiratory infection: Secondary | ICD-10-CM | POA: Diagnosis not present

## 2019-01-19 DIAGNOSIS — E119 Type 2 diabetes mellitus without complications: Secondary | ICD-10-CM | POA: Diagnosis not present

## 2019-01-19 DIAGNOSIS — M1A0690 Idiopathic chronic gout, unspecified knee, without tophus (tophi): Secondary | ICD-10-CM | POA: Diagnosis not present

## 2019-01-19 DIAGNOSIS — R0902 Hypoxemia: Secondary | ICD-10-CM | POA: Diagnosis not present

## 2019-01-19 DIAGNOSIS — R630 Anorexia: Secondary | ICD-10-CM | POA: Diagnosis not present

## 2019-01-19 DIAGNOSIS — R110 Nausea: Secondary | ICD-10-CM | POA: Diagnosis not present

## 2019-01-19 DIAGNOSIS — Z794 Long term (current) use of insulin: Secondary | ICD-10-CM | POA: Diagnosis not present

## 2019-01-19 DIAGNOSIS — R1084 Generalized abdominal pain: Secondary | ICD-10-CM | POA: Diagnosis not present

## 2019-01-24 ENCOUNTER — Other Ambulatory Visit: Payer: Self-pay | Admitting: *Deleted

## 2019-01-25 DIAGNOSIS — J45909 Unspecified asthma, uncomplicated: Secondary | ICD-10-CM | POA: Diagnosis not present

## 2019-01-25 DIAGNOSIS — J440 Chronic obstructive pulmonary disease with acute lower respiratory infection: Secondary | ICD-10-CM | POA: Diagnosis not present

## 2019-01-25 DIAGNOSIS — J449 Chronic obstructive pulmonary disease, unspecified: Secondary | ICD-10-CM | POA: Diagnosis not present

## 2019-01-25 DIAGNOSIS — R0689 Other abnormalities of breathing: Secondary | ICD-10-CM | POA: Diagnosis not present

## 2019-01-30 ENCOUNTER — Encounter: Payer: Self-pay | Admitting: Oncology

## 2019-01-30 ENCOUNTER — Inpatient Hospital Stay: Payer: Medicare HMO | Attending: Oncology | Admitting: Oncology

## 2019-01-30 DIAGNOSIS — Z87891 Personal history of nicotine dependence: Secondary | ICD-10-CM | POA: Diagnosis not present

## 2019-01-30 DIAGNOSIS — Z7982 Long term (current) use of aspirin: Secondary | ICD-10-CM | POA: Diagnosis not present

## 2019-01-30 DIAGNOSIS — D729 Disorder of white blood cells, unspecified: Secondary | ICD-10-CM | POA: Diagnosis not present

## 2019-01-30 DIAGNOSIS — D721 Eosinophilia: Secondary | ICD-10-CM | POA: Insufficient documentation

## 2019-01-30 DIAGNOSIS — D72821 Monocytosis (symptomatic): Secondary | ICD-10-CM | POA: Diagnosis not present

## 2019-01-30 DIAGNOSIS — Z79899 Other long term (current) drug therapy: Secondary | ICD-10-CM | POA: Insufficient documentation

## 2019-01-30 NOTE — Progress Notes (Signed)
I connected with Michael Wright on 01/30/19 at 10:45 AM EDT by video enabled telemedicine visit and verified that I am speaking with the correct person using two identifiers.   I discussed the limitations, risks, security and privacy concerns of performing an evaluation and management service by telemedicine and the availability of in-person appointments. I also discussed with the patient that there may be a patient responsible charge related to this service. The patient expressed understanding and agreed to proceed.  Other persons participating in the visit and their role in the encounter:  Patients daughter  Patient's location:  home Provider's location:  home    Reason for referral- leucocytosis   Referring physician- Dr. Kary Kos  Date of visit: 01/30/19   History of presenting illness- Patient is a 78 year old male with a past medical history significant for hypertension, hyperlipidemia, pulmonary hypertension, chronic respiratory failure on oxygen unknown etiology who has been referred to [place] for leukocytosis.  Most recent CBC from 01/19/2019 showed white count of 14.6, H&H of 13.8/44.5 and a platelet count of 245.  Of note patient has had chronic leukocytosis for the last 45 years and his white count waxes and wanes between 10.5-17.5.  Patient has been a non-smoker.  He reports feeling fatigued and has no significant appetite.  Reports about 10 pound weight loss over the last couple of months.  He had a CT chest in November 2019 which did not show any evidence of lung cancer or PE.  He was noted to have pulmonary hypertension as well as advanced three-vessel coronary artery calcifications.  Noted to have groundglass opacity in his bilateral lungs for which he sees Dr. Raul Del   Review of Systems  Constitutional: Positive for malaise/fatigue and weight loss. Negative for chills and fever.  HENT: Negative for congestion, ear discharge and nosebleeds.   Eyes: Negative for blurred vision.   Respiratory: Positive for shortness of breath. Negative for cough, hemoptysis, sputum production and wheezing.   Cardiovascular: Negative for chest pain, palpitations, orthopnea and claudication.  Gastrointestinal: Negative for abdominal pain, blood in stool, constipation, diarrhea, heartburn, melena, nausea and vomiting.  Genitourinary: Negative for dysuria, flank pain, frequency, hematuria and urgency.  Musculoskeletal: Negative for back pain, joint pain and myalgias.  Skin: Negative for rash.  Neurological: Negative for dizziness, tingling, focal weakness, seizures, weakness and headaches.  Endo/Heme/Allergies: Does not bruise/bleed easily.  Psychiatric/Behavioral: Negative for depression and suicidal ideas. The patient does not have insomnia.     Allergies  Allergen Reactions  . Tramadol Other (See Comments)    Light Headedness    Past Medical History:  Diagnosis Date  . Anemia   . Arthritis    shoulders, knees, lower spine  . BPH (benign prostatic hyperplasia)   . Chronic diastolic (congestive) heart failure ([place])   . Chronic kidney disease (CKD), stage III (moderate) (HCC)   . Cough   . Dementia ([place])   . Diabetes mellitus, type 2 ([place])   . GERD (gastroesophageal reflux disease)   . Gout   . Hyperlipidemia   . Hypertension   . Kidney stones   . Neuropathy    feet  . TIA (transient ischemic attack) 2016   "several"    Past Surgical History:  Procedure Laterality Date  . ADENOIDECTOMY  at age 28  . SHOULDER SURGERY    . TONSILLECTOMY    . VESICOSTOMY      Social History   Socioeconomic History  . Marital status: Married    Spouse name: married  .  Number of children: 3  . Years of education: Not on file  . Highest education level: Not on file  Occupational History  . Occupation: works in a shop at [employer]  . Financial resource strain: Not on file  . Food insecurity:    Worry: Not on file    Inability: Not on file  .  Transportation needs:    Medical: Not on file    Non-medical: Not on file  Tobacco Use  . Smoking status: Never Smoker  . Smokeless tobacco: Former Systems developer    Types: Chew  Substance and Sexual Activity  . Alcohol use: No  . Drug use: No  . Sexual activity: Not on file  Lifestyle  . Physical activity:    Days per week: Not on file    Minutes per session: Not on file  . Stress: Not on file  Relationships  . Social connections:    Talks on phone: Not on file    Gets together: Not on file    Attends religious service: Not on file    Active member of club or organization: Not on file    Attends meetings of clubs or organizations: Not on file    Relationship status: Not on file  . Intimate partner violence:    Fear of current or ex partner: Not on file    Emotionally abused: Not on file    Physically abused: Not on file    Forced sexual activity: Not on file  Other Topics Concern  . Not on file  Social History Narrative  . Not on file    Family History  Problem Relation Age of Onset  . Allergies Daughter   . Diabetes Father      Current Outpatient Medications:  .  allopurinol (ZYLOPRIM) 100 MG tablet, Take 100 mg by mouth 2 (two) times daily. , Disp: , Rfl:  .  aspirin EC 81 MG tablet, Take 81 mg by mouth daily., Disp: , Rfl:  .  bismuth subsalicylate (PEPTO BISMOL) 262 MG/15ML suspension, Take 30 mLs by mouth every 6 (six) hours as needed., Disp: , Rfl:  .  carvedilol (COREG) 3.125 MG tablet, Take 3.125 mg by mouth 2 (two) times daily with a meal., Disp: , Rfl:  .  colchicine 0.6 MG tablet, Take 0.6 mg by mouth 2 (two) times daily as needed (for gout flares)., Disp: , Rfl:  .  Cyanocobalamin 3000 MCG SUBL, Place 3,000 mcg under the tongue daily at 12 noon., Disp: , Rfl:  .  donepezil (ARICEPT) 10 MG tablet, Take 10 mg by mouth daily. , Disp: , Rfl:  .  finasteride (PROSCAR) 5 MG tablet, Take 1 tablet (5 mg total) by mouth daily., Disp: 30 tablet, Rfl: 2 .  furosemide  (LASIX) 20 MG tablet, Take 20 mg by mouth daily., Disp: , Rfl:  .  guaiFENesin (MUCINEX PO), Take 400 mg by mouth daily as needed., Disp: , Rfl:  .  insulin glargine (LANTUS) 100 unit/mL SOPN, Inject 0.36 mLs (36 Units total) into the skin at bedtime. (Patient taking differently: Inject 35 Units into the skin at bedtime. ), Disp: 15 mL, Rfl: 2 .  ipratropium-albuterol (DUONEB) 0.5-2.5 (3) MG/3ML SOLN, Take 3 mLs by nebulization every 6 (six) hours as needed (For wheezing and shortness of breath)., Disp: 360 mL, Rfl: 1 .  loratadine (CLARITIN) 10 MG tablet, Take 10 mg by mouth daily., Disp: , Rfl:  .  losartan (COZAAR) 25 MG tablet, Take  25 mg by mouth daily., Disp: , Rfl:  .  metFORMIN (GLUCOPHAGE-XR) 500 MG 24 hr tablet, Take 500 mg by mouth 2 (two) times daily., Disp: , Rfl:  .  OXYGEN, Inhale into the lungs at bedtime as needed., Disp: , Rfl:  .  pantoprazole (PROTONIX) 40 MG tablet, Take 1 tablet (40 mg total) by mouth daily. (Patient taking differently: Take 40 mg by mouth 2 (two) times daily. ), Disp: 30 tablet, Rfl: 2 .  potassium chloride 20 MEQ TBCR, Take 20 mEq by mouth daily. While on lasix, Disp: 30 tablet, Rfl: 2 .  pravastatin (PRAVACHOL) 40 MG tablet, Take 40 mg by mouth at bedtime. , Disp: , Rfl:  .  traZODone (DESYREL) 50 MG tablet, Take 1 tablet (50 mg total) by mouth at bedtime., Disp: 30 tablet, Rfl: 2 .  Turmeric 500 MG CAPS, Take 1 Dose by mouth daily., Disp: , Rfl:  .  furosemide (LASIX) 40 MG tablet, Take 1 tablet (40 mg total) by mouth daily., Disp: 30 tablet, Rfl: 3 .  HYDROcodone-homatropine (HYCODAN) 5-1.5 MG/5ML syrup, Take 3 mLs by mouth every 6 (six) hours as needed for cough. (Patient not taking: Reported on 01/30/2019), Disp: 120 mL, Rfl: 0  No results found.  No images are attached to the encounter.   CMP Latest Ref Rng & Units 07/27/2018  Glucose 70 - 99 mg/dL 149(H)  BUN 8 - 23 mg/dL 33(H)  Creatinine 0.61 - 1.24 mg/dL 1.42(H)  Sodium 135 - 145 mmol/L 142   Potassium 3.5 - 5.1 mmol/L 3.8  Chloride 98 - 111 mmol/L 94(L)  CO2 22 - 32 mmol/L 40(H)  Calcium 8.9 - 10.3 mg/dL 8.9  Total Protein 6.5 - 8.1 g/dL -  Total Bilirubin 0.3 - 1.2 mg/dL -  Alkaline Phos 38 - 126 U/L -  AST 15 - 41 U/L -  ALT 0 - 44 U/L -   CBC Latest Ref Rng & Units 07/27/2018  WBC 4.0 - 10.5 K/uL 15.4(H)  Hemoglobin 13.0 - 17.0 g/dL 13.3  Hematocrit 39.0 - 52.0 % 43.9  Platelets 150 - 400 K/uL 245     Observation/objective: appears fatigued and increased work of breathing. On home O2  Assessment and plan: Patient is a 78 year old male with multiple comorbidities and acute hypoxic respiratory failure of unknown etiology different possibly leukocytosis  Patient has had waxing and waning leukocytosis when his white count ranges between 10-17 over the last 4 years.  Differential mainly shows neutrophilia and monocytosis.  I suspect this is reactive secondary to underlying comorbidities.  I will check a CBC with differential, smear review, flow cytometry as well as BCR ABL FISH at this time.  I will see the patient back in 2 weeks for video visit to discuss the results of his blood work  With regards to his loss of appetite and weight loss I think that his baseline Dr. Raul Del disabled repeat CT chest would be warranted given his worsening respiratory condition.  CT chest from November 2019 did not reveal any evidence of malignancy  Follow-up instructions:  I discussed the assessment and treatment plan with the patient. The patient was provided an opportunity to ask questions and all were answered. The patient agreed with the plan and demonstrated an understanding of the instructions.   The patient was advised to call back or seek an in-person evaluation if the symptoms worsen or if the condition fails to improve as anticipated.    Visit Diagnosis: 1. Neutrophilia   2.  Monocytosis     Dr. Randa Evens, MD, MPH [facility] [phone] 01/30/2019 12:55 PM

## 2019-01-31 ENCOUNTER — Other Ambulatory Visit: Payer: Self-pay

## 2019-01-31 ENCOUNTER — Inpatient Hospital Stay: Payer: Medicare HMO

## 2019-01-31 DIAGNOSIS — D72821 Monocytosis (symptomatic): Secondary | ICD-10-CM

## 2019-01-31 DIAGNOSIS — D729 Disorder of white blood cells, unspecified: Secondary | ICD-10-CM

## 2019-01-31 DIAGNOSIS — Z79899 Other long term (current) drug therapy: Secondary | ICD-10-CM | POA: Diagnosis not present

## 2019-01-31 DIAGNOSIS — D721 Eosinophilia: Secondary | ICD-10-CM | POA: Diagnosis not present

## 2019-01-31 LAB — CBC WITH DIFFERENTIAL/PLATELET
Abs Immature Granulocytes: 0.06 10*3/uL (ref 0.00–0.07)
Basophils Absolute: 0.2 10*3/uL — ABNORMAL HIGH (ref 0.0–0.1)
Basophils Relative: 1 %
Eosinophils Absolute: 1.6 10*3/uL — ABNORMAL HIGH (ref 0.0–0.5)
Eosinophils Relative: 11 %
HCT: 42.6 % (ref 39.0–52.0)
Hemoglobin: 13.5 g/dL (ref 13.0–17.0)
Immature Granulocytes: 0 %
Lymphocytes Relative: 19 %
Lymphs Abs: 2.8 10*3/uL (ref 0.7–4.0)
MCH: 28.8 pg (ref 26.0–34.0)
MCHC: 31.7 g/dL (ref 30.0–36.0)
MCV: 91 fL (ref 80.0–100.0)
Monocytes Absolute: 0.9 10*3/uL (ref 0.1–1.0)
Monocytes Relative: 6 %
Neutro Abs: 9.1 10*3/uL — ABNORMAL HIGH (ref 1.7–7.7)
Neutrophils Relative %: 63 %
Platelets: 247 10*3/uL (ref 150–400)
RBC: 4.68 MIL/uL (ref 4.22–5.81)
RDW: 12.6 % (ref 11.5–15.5)
WBC: 14.6 10*3/uL — ABNORMAL HIGH (ref 4.0–10.5)
nRBC: 0 % (ref 0.0–0.2)

## 2019-02-02 LAB — COMP PANEL: LEUKEMIA/LYMPHOMA

## 2019-02-07 NOTE — Patient Outreach (Signed)
[facility]) Care Management  01/25/2019 Late entry  Otho 1940-09-03 [id number]  [place] telephone call to patient.  Hipaa compliance verified. Per patient he has not been going out much. Patient stated that he has a non productive cough. Patient stated his appetite is poor. Patient is currently having rt leg weakness. He has fallen a lot in the past. Patient stated that he has left leg pain and  Knee pain. Patient stated that he is having some diarrhea 2-3 times at night. Per patient he is due a colonoscopy. No blood noted in stool.  Per patient his fasting blood sugar is 111. Patient has agreed to follow up outreach calls.    Current Medications:  Current Outpatient Medications  Medication Sig Dispense Refill  . allopurinol (ZYLOPRIM) 100 MG tablet Take 100 mg by mouth 2 (two) times daily.     Marland Kitchen aspirin EC 81 MG tablet Take 81 mg by mouth daily.    Marland Kitchen bismuth subsalicylate (PEPTO BISMOL) 262 MG/15ML suspension Take 30 mLs by mouth every 6 (six) hours as needed.    . carvedilol (COREG) 3.125 MG tablet Take 3.125 mg by mouth 2 (two) times daily with a meal.    . colchicine 0.6 MG tablet Take 0.6 mg by mouth 2 (two) times daily as needed (for gout flares).    . Cyanocobalamin 3000 MCG SUBL Place 3,000 mcg under the tongue daily at 12 noon.    . donepezil (ARICEPT) 10 MG tablet Take 10 mg by mouth daily.     . finasteride (PROSCAR) 5 MG tablet Take 1 tablet (5 mg total) by mouth daily. 30 tablet 2  . furosemide (LASIX) 20 MG tablet Take 20 mg by mouth daily.    . furosemide (LASIX) 40 MG tablet Take 1 tablet (40 mg total) by mouth daily. 30 tablet 3  . guaiFENesin (MUCINEX PO) Take 400 mg by mouth daily as needed.    Marland Kitchen HYDROcodone-homatropine (HYCODAN) 5-1.5 MG/5ML syrup Take 3 mLs by mouth every 6 (six) hours as needed for cough. (Patient not taking: Reported on 01/30/2019) 120 mL 0  . insulin glargine (LANTUS) 100 unit/mL SOPN Inject 0.36 mLs (36 Units  total) into the skin at bedtime. (Patient taking differently: Inject 35 Units into the skin at bedtime. ) 15 mL 2  . ipratropium-albuterol (DUONEB) 0.5-2.5 (3) MG/3ML SOLN Take 3 mLs by nebulization every 6 (six) hours as needed (For wheezing and shortness of breath). 360 mL 1  . loratadine (CLARITIN) 10 MG tablet Take 10 mg by mouth daily.    Marland Kitchen losartan (COZAAR) 25 MG tablet Take 25 mg by mouth daily.    . metFORMIN (GLUCOPHAGE-XR) 500 MG 24 hr tablet Take 500 mg by mouth 2 (two) times daily.    . OXYGEN Inhale into the lungs at bedtime as needed.    . pantoprazole (PROTONIX) 40 MG tablet Take 1 tablet (40 mg total) by mouth daily. (Patient taking differently: Take 40 mg by mouth 2 (two) times daily. ) 30 tablet 2  . potassium chloride 20 MEQ TBCR Take 20 mEq by mouth daily. While on lasix 30 tablet 2  . pravastatin (PRAVACHOL) 40 MG tablet Take 40 mg by mouth at bedtime.     . traZODone (DESYREL) 50 MG tablet Take 1 tablet (50 mg total) by mouth at bedtime. 30 tablet 2  . Turmeric 500 MG CAPS Take 1 Dose by mouth daily.     No current facility-administered medications for this  visit.     Functional Status:  In your present state of health, do you have any difficulty performing the following activities: 11/22/2018 08/02/2018  Hearing? N Y  Vision? Y Y  Comment patient has cataract that he needs surgery and is unable to see out of one eye at this time.  -  Difficulty concentrating or making decisions? N N  Walking or climbing stairs? Y Y  Dressing or bathing? Y Y  Doing errands, shopping? Tempie Donning  Preparing Food and eating ? Y N  Comment - Family assists  Using the Toilet? N N  In the past six months, have you accidently leaked urine? N N  Do you have problems with loss of bowel control? N N  Managing your Medications? Tempie Donning  Comment family assists  Family assists  Managing your Finances? Tempie Donning  Comment family Family assists  Housekeeping or managing your Housekeeping? Tempie Donning  Comment family  Family assists  Some recent data might be hidden    Fall/Depression Screening: Fall Risk  01/25/2019 11/22/2018 10/26/2018  Falls in the past year? 0 0 (No Data)  Comment - - Denies new/ recent falls  Number falls in past yr: - - -  Injury with Fall? - - -  Comment - - -  Risk for fall due to : - - -  Risk for fall due to: Comment - - -  Follow up - - Falls prevention discussed   PHQ 2/9 Scores 01/25/2019 11/22/2018 08/02/2018  PHQ - 2 Score 2 2 2   PHQ- 9 Score 6 6 6    THN CM Care Plan Problem One     Most Recent Value  Care Plan Problem One  Knowledge Deficit in self management of COPD  Role Documenting the Problem One  [place] for Problem One  Active  THN Long Term Goal   Patient will not have any admissions within the next 90 days for COPD exacerbation  THN Long Term Goal Start Date  02/07/19  Interventions for Problem One Long Term Goal  RN reiterates the COPD exacerbation. RN reiterates covid-19 precautions. RN will follow up with further discussion  THN CM Short Term Goal #1   Patient will verbalize keeping a record of blood sugars and weights within the next 30 days  THN CM Short Term Goal #2   Patient will verbalize symptoms of hypo and hyperglycemia within the next 30 days  THN CM Short Term Goal #3  Patient will verbalize eating healthy snacks within the next 30 days  THN CM Short Term Goal #3 Start Date  11/22/18  Interventions for Short Tern Goal #3  RN reiterates eating regular meals. RN reiterates eating healthy snacks. RN will follow up with further discussion       Assessment:  Patient has a non productive cough Patient stated his appetite is poor Patient is having 2-3 watery stools a day Patient is due colonoscopy Per patient WBC is slightly elevated  Plan:   Patient will follow up with physician for colonoscopy Patient will drink more fluids Patient will continue to take medications as per ordered RN will follow up within the month of  September Patient is using pandemic safety precautions  [facility] [phone]

## 2019-02-08 LAB — BCR-ABL1 FISH
Cells Analyzed: 200
Cells Counted: 200

## 2019-02-14 ENCOUNTER — Ambulatory Visit: Payer: Medicare HMO | Admitting: Oncology

## 2019-02-16 ENCOUNTER — Encounter: Payer: Self-pay | Admitting: Oncology

## 2019-02-16 ENCOUNTER — Inpatient Hospital Stay (HOSPITAL_BASED_OUTPATIENT_CLINIC_OR_DEPARTMENT_OTHER): Payer: Medicare HMO | Admitting: Oncology

## 2019-02-16 DIAGNOSIS — D729 Disorder of white blood cells, unspecified: Secondary | ICD-10-CM | POA: Diagnosis not present

## 2019-02-16 NOTE — Progress Notes (Signed)
He will get his results from testing done to make decision for future plan. Pt with no pain today.

## 2019-02-20 NOTE — Progress Notes (Signed)
I connected with Michael Wright on 02/20/19 at  1:15 PM EDT by video enabled telemedicine visit and verified that I am speaking with the correct person using two identifiers.   I discussed the limitations, risks, security and privacy concerns of performing an evaluation and management service by telemedicine and the availability of in-person appointments. I also discussed with the patient that there may be a patient responsible charge related to this service. The patient expressed understanding and agreed to proceed.  Other persons participating in the visit and their role in the encounter:  Patients daughter  Patient's location:  home Provider's location:  work  Risk analyst Complaint:  Discuss results of bloodwork  History of present illness: Patient is a 78 year old male with a past medical history significant for hypertension, hyperlipidemia, pulmonary hypertension, chronic respiratory failure on oxygen unknown etiology who has been referred to [place] for leukocytosis.  Most recent CBC from 01/19/2019 showed white count of 14.6, H&H of 13.8/44.5 and a platelet count of 245.  Of note patient has had chronic leukocytosis for the last 45 years and his white count waxes and wanes between 10.5-17.5.  Patient has been a non-smoker.  He reports feeling fatigued and has no significant appetite.  Reports about 10 pound weight loss over the last couple of months.  He had a CT chest in November 2019 which did not show any evidence of lung cancer or PE.  He was noted to have pulmonary hypertension as well as advanced three-vessel coronary artery calcifications.  Noted to have groundglass opacity in his bilateral lungs for which he sees Dr. Raul Del  Results of blood work from 02/08/2019 were as follows: CBC showed white count of 14.6, H&H of 13.5/42.6 and a platelet count of 247.  Peripheral flow cytometry did not reveal any immunophenotypic abnormality.  There was some eosinophilia noted.  BCR ABL FISH testing was  negative as well.  Interval history patient has baseline shortness of breath which has remained unchanged.  He reports chronic fatigue.   Review of Systems  Constitutional: Positive for malaise/fatigue and weight loss. Negative for chills and fever.  HENT: Negative for congestion, ear discharge and nosebleeds.   Eyes: Negative for blurred vision.  Respiratory: Negative for cough, hemoptysis, sputum production, shortness of breath and wheezing.   Cardiovascular: Negative for chest pain, palpitations, orthopnea and claudication.  Gastrointestinal: Negative for abdominal pain, blood in stool, constipation, diarrhea, heartburn, melena, nausea and vomiting.  Genitourinary: Negative for dysuria, flank pain, frequency, hematuria and urgency.  Musculoskeletal: Negative for back pain, joint pain and myalgias.  Skin: Negative for rash.  Neurological: Negative for dizziness, tingling, focal weakness, seizures, weakness and headaches.  Endo/Heme/Allergies: Does not bruise/bleed easily.  Psychiatric/Behavioral: Negative for depression and suicidal ideas. The patient does not have insomnia.     Allergies  Allergen Reactions  . Tramadol Other (See Comments)    Light Headedness    Past Medical History:  Diagnosis Date  . Anemia   . Arthritis    shoulders, knees, lower spine  . BPH (benign prostatic hyperplasia)   . Chronic diastolic (congestive) heart failure ([place])   . Chronic kidney disease (CKD), stage III (moderate) (HCC)   . Cough   . Dementia ([place])   . Diabetes mellitus, type 2 ([place])   . GERD (gastroesophageal reflux disease)   . Gout   . Hyperlipidemia   . Hypertension   . Kidney stones   . Neuropathy    feet  . TIA (transient ischemic attack) 2016   "  several"    Past Surgical History:  Procedure Laterality Date  . ADENOIDECTOMY  at age 86  . SHOULDER SURGERY    . TONSILLECTOMY    . VESICOSTOMY      Social History   Socioeconomic History  . Marital status: Married     Spouse name: married  . Number of children: 3  . Years of education: Not on file  . Highest education level: Not on file  Occupational History  . Occupation: works in a shop at [employer]  . Financial resource strain: Not on file  . Food insecurity    Worry: Not on file    Inability: Not on file  . Transportation needs    Medical: Not on file    Non-medical: Not on file  Tobacco Use  . Smoking status: Never Smoker  . Smokeless tobacco: Former Systems developer    Types: Chew  Substance and Sexual Activity  . Alcohol use: No  . Drug use: No  . Sexual activity: Not on file  Lifestyle  . Physical activity    Days per week: Not on file    Minutes per session: Not on file  . Stress: Not on file  Relationships  . Social Herbalist on phone: Not on file    Gets together: Not on file    Attends religious service: Not on file    Active member of club or organization: Not on file    Attends meetings of clubs or organizations: Not on file    Relationship status: Not on file  . Intimate partner violence    Fear of current or ex partner: Not on file    Emotionally abused: Not on file    Physically abused: Not on file    Forced sexual activity: Not on file  Other Topics Concern  . Not on file  Social History Narrative  . Not on file    Family History  Problem Relation Age of Onset  . Allergies Daughter   . Diabetes Father      Current Outpatient Medications:  .  allopurinol (ZYLOPRIM) 100 MG tablet, Take 100 mg by mouth 2 (two) times daily. , Disp: , Rfl:  .  aspirin EC 81 MG tablet, Take 81 mg by mouth daily., Disp: , Rfl:  .  bismuth subsalicylate (PEPTO BISMOL) 262 MG/15ML suspension, Take 30 mLs by mouth every 6 (six) hours as needed., Disp: , Rfl:  .  carvedilol (COREG) 3.125 MG tablet, Take 3.125 mg by mouth 2 (two) times daily with a meal., Disp: , Rfl:  .  colchicine 0.6 MG tablet, Take 0.6 mg by mouth 2 (two) times daily as needed (for gout  flares)., Disp: , Rfl:  .  Cyanocobalamin 3000 MCG SUBL, Place 3,000 mcg under the tongue daily at 12 noon., Disp: , Rfl:  .  donepezil (ARICEPT) 10 MG tablet, Take 10 mg by mouth daily. , Disp: , Rfl:  .  finasteride (PROSCAR) 5 MG tablet, Take 1 tablet (5 mg total) by mouth daily., Disp: 30 tablet, Rfl: 2 .  furosemide (LASIX) 20 MG tablet, Take 20 mg by mouth daily., Disp: , Rfl:  .  furosemide (LASIX) 40 MG tablet, Take 1 tablet (40 mg total) by mouth daily., Disp: 30 tablet, Rfl: 3 .  guaiFENesin (MUCINEX PO), Take 400 mg by mouth daily as needed., Disp: , Rfl:  .  HYDROcodone-homatropine (HYCODAN) 5-1.5 MG/5ML syrup, Take 3 mLs by mouth every 6 (  six) hours as needed for cough., Disp: 120 mL, Rfl: 0 .  insulin glargine (LANTUS) 100 unit/mL SOPN, Inject 0.36 mLs (36 Units total) into the skin at bedtime. (Patient taking differently: Inject 35 Units into the skin at bedtime. ), Disp: 15 mL, Rfl: 2 .  ipratropium-albuterol (DUONEB) 0.5-2.5 (3) MG/3ML SOLN, Take 3 mLs by nebulization every 6 (six) hours as needed (For wheezing and shortness of breath)., Disp: 360 mL, Rfl: 1 .  loratadine (CLARITIN) 10 MG tablet, Take 10 mg by mouth daily., Disp: , Rfl:  .  losartan (COZAAR) 25 MG tablet, Take 25 mg by mouth daily., Disp: , Rfl:  .  metFORMIN (GLUCOPHAGE-XR) 500 MG 24 hr tablet, Take 500 mg by mouth 2 (two) times daily., Disp: , Rfl:  .  OXYGEN, Inhale into the lungs at bedtime as needed., Disp: , Rfl:  .  pantoprazole (PROTONIX) 40 MG tablet, Take 1 tablet (40 mg total) by mouth daily. (Patient taking differently: Take 40 mg by mouth 2 (two) times daily. ), Disp: 30 tablet, Rfl: 2 .  potassium chloride 20 MEQ TBCR, Take 20 mEq by mouth daily. While on lasix, Disp: 30 tablet, Rfl: 2 .  pravastatin (PRAVACHOL) 40 MG tablet, Take 40 mg by mouth at bedtime. , Disp: , Rfl:  .  traZODone (DESYREL) 50 MG tablet, Take 1 tablet (50 mg total) by mouth at bedtime., Disp: 30 tablet, Rfl: 2 .  Turmeric 500 MG  CAPS, Take 1 Dose by mouth daily., Disp: , Rfl:     CMP Latest Ref Rng & Units 07/27/2018  Glucose 70 - 99 mg/dL 149(H)  BUN 8 - 23 mg/dL 33(H)  Creatinine 0.61 - 1.24 mg/dL 1.42(H)  Sodium 135 - 145 mmol/L 142  Potassium 3.5 - 5.1 mmol/L 3.8  Chloride 98 - 111 mmol/L 94(L)  CO2 22 - 32 mmol/L 40(H)  Calcium 8.9 - 10.3 mg/dL 8.9  Total Protein 6.5 - 8.1 g/dL -  Total Bilirubin 0.3 - 1.2 mg/dL -  Alkaline Phos 38 - 126 U/L -  AST 15 - 41 U/L -  ALT 0 - 44 U/L -   CBC Latest Ref Rng & Units 01/31/2019  WBC 4.0 - 10.5 K/uL 14.6(H)  Hemoglobin 13.0 - 17.0 g/dL 13.5  Hematocrit 39.0 - 52.0 % 42.6  Platelets 150 - 400 K/uL 247     Observation/objective: appears fatigued and has baseline sob at rest  Assessment and plan:patient is a 78 yr old male referred for leucocytosis  Patient has mild +Leukocytosis which has been waxing and waning since 2017.  His most recent white count was 14.6 with predominant neutrophilia with some eosinophilia and basophilia.  Flow cytometry testing was unremarkable and BCR ABL FISH testing was negative as well.  I suspect his neutrophilia secondary to his underlying comorbidities.  I am inclined to monitor this conservatively without the need for bone marrow biopsy at this time.  Follow-up instructions: CBC with differential in 3 months in 6 months and I will see him back in 6 months  I discussed the assessment and treatment plan with the patient. The patient was provided an opportunity to ask questions and all were answered. The patient agreed with the plan and demonstrated an understanding of the instructions.   The patient was advised to call back or seek an in-person evaluation if the symptoms worsen or if the condition fails to improve as anticipated.    Visit Diagnosis: 1. Neutrophilia     Dr. Randa Evens, MD,  [facility] [phone] 02/20/2019 8:36 AM

## 2019-02-25 DIAGNOSIS — J440 Chronic obstructive pulmonary disease with acute lower respiratory infection: Secondary | ICD-10-CM | POA: Diagnosis not present

## 2019-02-25 DIAGNOSIS — J45909 Unspecified asthma, uncomplicated: Secondary | ICD-10-CM | POA: Diagnosis not present

## 2019-02-25 DIAGNOSIS — R0689 Other abnormalities of breathing: Secondary | ICD-10-CM | POA: Diagnosis not present

## 2019-02-25 DIAGNOSIS — J449 Chronic obstructive pulmonary disease, unspecified: Secondary | ICD-10-CM | POA: Diagnosis not present

## 2019-03-13 DIAGNOSIS — R197 Diarrhea, unspecified: Secondary | ICD-10-CM | POA: Diagnosis not present

## 2019-03-13 DIAGNOSIS — E1122 Type 2 diabetes mellitus with diabetic chronic kidney disease: Secondary | ICD-10-CM | POA: Diagnosis not present

## 2019-03-13 DIAGNOSIS — I129 Hypertensive chronic kidney disease with stage 1 through stage 4 chronic kidney disease, or unspecified chronic kidney disease: Secondary | ICD-10-CM | POA: Diagnosis not present

## 2019-03-13 DIAGNOSIS — N183 Chronic kidney disease, stage 3 (moderate): Secondary | ICD-10-CM | POA: Diagnosis not present

## 2019-03-13 DIAGNOSIS — I10 Essential (primary) hypertension: Secondary | ICD-10-CM | POA: Diagnosis not present

## 2019-03-13 DIAGNOSIS — I509 Heart failure, unspecified: Secondary | ICD-10-CM | POA: Diagnosis not present

## 2019-03-13 DIAGNOSIS — M25561 Pain in right knee: Secondary | ICD-10-CM | POA: Diagnosis not present

## 2019-03-13 DIAGNOSIS — R109 Unspecified abdominal pain: Secondary | ICD-10-CM | POA: Diagnosis not present

## 2019-03-27 DIAGNOSIS — J440 Chronic obstructive pulmonary disease with acute lower respiratory infection: Secondary | ICD-10-CM | POA: Diagnosis not present

## 2019-03-27 DIAGNOSIS — R0689 Other abnormalities of breathing: Secondary | ICD-10-CM | POA: Diagnosis not present

## 2019-03-27 DIAGNOSIS — J45909 Unspecified asthma, uncomplicated: Secondary | ICD-10-CM | POA: Diagnosis not present

## 2019-03-27 DIAGNOSIS — J449 Chronic obstructive pulmonary disease, unspecified: Secondary | ICD-10-CM | POA: Diagnosis not present

## 2019-04-25 ENCOUNTER — Other Ambulatory Visit: Payer: Self-pay | Admitting: *Deleted

## 2019-04-27 DIAGNOSIS — J449 Chronic obstructive pulmonary disease, unspecified: Secondary | ICD-10-CM | POA: Diagnosis not present

## 2019-04-27 DIAGNOSIS — R0689 Other abnormalities of breathing: Secondary | ICD-10-CM | POA: Diagnosis not present

## 2019-04-27 DIAGNOSIS — J45909 Unspecified asthma, uncomplicated: Secondary | ICD-10-CM | POA: Diagnosis not present

## 2019-04-27 DIAGNOSIS — J440 Chronic obstructive pulmonary disease with acute lower respiratory infection: Secondary | ICD-10-CM | POA: Diagnosis not present

## 2019-05-02 NOTE — Patient Outreach (Addendum)
[name] Care Management  04/25/2019 Late entry  [name] 18-Feb-1941 [id number]  [place] telephone call to patient.  [place] compliance verified. Per patient his breathing is fair. Patient is using O2 continuously. Per patient has appetite poor.  Patient stated that he has a non productive cough. Patient has an elevated WBC and was told to  drink more water than sodas. Patient stated that he is having diarrhea 2-3 times a night. Per patient he has pain in legs. Patient stated that he has  rt leg weakness. Per patient he fell a lot. Patient has agreed to follow up outreach calls.   Current Medications:  Current Outpatient Medications  Medication Sig Dispense Refill  . allopurinol (ZYLOPRIM) 100 MG tablet Take 100 mg by mouth 2 (two) times daily.     Marland Kitchen aspirin EC 81 MG tablet Take 81 mg by mouth daily.    Marland Kitchen bismuth subsalicylate (PEPTO BISMOL) 262 MG/15ML suspension Take 30 mLs by mouth every 6 (six) hours as needed.    . carvedilol (COREG) 3.125 MG tablet Take 3.125 mg by mouth 2 (two) times daily with a meal.    . colchicine 0.6 MG tablet Take 0.6 mg by mouth 2 (two) times daily as needed (for gout flares).    . Cyanocobalamin 3000 MCG SUBL Place 3,000 mcg under the tongue daily at 12 noon.    . donepezil (ARICEPT) 10 MG tablet Take 10 mg by mouth daily.     . finasteride (PROSCAR) 5 MG tablet Take 1 tablet (5 mg total) by mouth daily. 30 tablet 2  . furosemide (LASIX) 20 MG tablet Take 20 mg by mouth daily.    . furosemide (LASIX) 40 MG tablet Take 1 tablet (40 mg total) by mouth daily. 30 tablet 3  . guaiFENesin (MUCINEX PO) Take 400 mg by mouth daily as needed.    Marland Kitchen HYDROcodone-homatropine (HYCODAN) 5-1.5 MG/5ML syrup Take 3 mLs by mouth every 6 (six) hours as needed for cough. 120 mL 0  . insulin glargine (LANTUS) 100 unit/mL SOPN Inject 0.36 mLs (36 Units total) into the skin at bedtime. (Patient taking differently: Inject 35 Units into the skin at  bedtime. ) 15 mL 2  . ipratropium-albuterol (DUONEB) 0.5-2.5 (3) MG/3ML SOLN Take 3 mLs by nebulization every 6 (six) hours as needed (For wheezing and shortness of breath). 360 mL 1  . loratadine (CLARITIN) 10 MG tablet Take 10 mg by mouth daily.    Marland Kitchen losartan (COZAAR) 25 MG tablet Take 25 mg by mouth daily.    . metFORMIN (GLUCOPHAGE-XR) 500 MG 24 hr tablet Take 500 mg by mouth 2 (two) times daily.    . OXYGEN Inhale into the lungs at bedtime as needed.    . pantoprazole (PROTONIX) 40 MG tablet Take 1 tablet (40 mg total) by mouth daily. (Patient taking differently: Take 40 mg by mouth 2 (two) times daily. ) 30 tablet 2  . potassium chloride 20 MEQ TBCR Take 20 mEq by mouth daily. While on lasix 30 tablet 2  . pravastatin (PRAVACHOL) 40 MG tablet Take 40 mg by mouth at bedtime.     . traZODone (DESYREL) 50 MG tablet Take 1 tablet (50 mg total) by mouth at bedtime. 30 tablet 2  . Turmeric 500 MG CAPS Take 1 Dose by mouth daily.     No current facility-administered medications for this visit.     Functional Status:  In your present state of health, do you have  any difficulty performing the following activities: 11/22/2018 08/02/2018  Hearing? N Y  Vision? Y Y  Comment patient has cataract that he needs surgery and is unable to see out of one eye at this time.  -  Difficulty concentrating or making decisions? N N  Walking or climbing stairs? Y Y  Dressing or bathing? Y Y  Doing errands, shopping? Michael Wright  Preparing Food and eating ? Y N  Comment - Family assists  Using the Toilet? N N  In the past six months, have you accidently leaked urine? N N  Do you have problems with loss of bowel control? N N  Managing your Medications? Michael Wright  Comment family assists  Family assists  Managing your Finances? Michael Wright  Comment family Family assists  Housekeeping or managing your Housekeeping? Michael Wright  Comment family Family assists  Some recent data might be hidden    Fall/Depression Screening: Fall Risk   04/25/2019 01/25/2019 11/22/2018  Falls in the past year? 0 0 0  Comment - - -  Number falls in past yr: 0 - -  Injury with Fall? 0 - -  Comment - - -  Risk for fall due to : Impaired balance/gait;Impaired mobility - -  Risk for fall due to: Comment - - -  Follow up Falls evaluation completed;Education provided - -   PHQ 2/9 Scores 04/25/2019 01/25/2019 11/22/2018 08/02/2018  PHQ - 2 Score 2 2 2 2   PHQ- 9 Score - 6 6 6    THN CM Care Plan Problem One     Most Recent Value  Care Plan Problem One  Knowledge Deficit in self management of COPD  Role Documenting the Problem One  [place] for Problem One  Active  THN Long Term Goal   Patient will not have any admissions within the next 90 days for COPD exacerbation  Interventions for Problem One Long Term Goal  RN reiterates zones and action plan for Copd. RN will follow up with further discussion  THN CM Short Term Goal #1   Patient will follow up with health maintenance care within the next 90 days  THN CM Short Term Goal #1 Start Date  04/25/19  Interventions for Short Term Goal #1  RN discussed health maintenance, flu shot and eye exam. RN will follow up with further discussion.  THN CM Short Term Goal #2   Patient will report not falling within the next 30 days  THN CM Short Term Goal #2 Start Date  04/25/19  Interventions for Short Term Goal #2  RN discussed fall prevention.. RN sent educational material on fall prevention. RN will follow up with further discussion.  THN CM Short Term Goal #3  Patient will verbalize eating healthy snacks within the next 30 days       Assessment:  Patient has a non productive cough Patient has had multiple falls Patient is having multiple episodes diarrhea Patient understands the zones and action plan for COPD Plan:  Patient is using pandemic safety precautions Patient is trying to eat small meals Patient will continue to review COPD zones and action plan Patient will not have any falls  within the next 30 days RN sent educational material on fall prevention RN will follow up within the month of November  Michael Wright [facility] [phone]

## 2019-05-10 DIAGNOSIS — H2512 Age-related nuclear cataract, left eye: Secondary | ICD-10-CM | POA: Diagnosis not present

## 2019-05-11 DIAGNOSIS — R197 Diarrhea, unspecified: Secondary | ICD-10-CM | POA: Diagnosis not present

## 2019-05-11 DIAGNOSIS — K760 Fatty (change of) liver, not elsewhere classified: Secondary | ICD-10-CM | POA: Diagnosis not present

## 2019-05-11 DIAGNOSIS — Z86010 Personal history of colonic polyps: Secondary | ICD-10-CM | POA: Diagnosis not present

## 2019-05-15 DIAGNOSIS — H2512 Age-related nuclear cataract, left eye: Secondary | ICD-10-CM | POA: Diagnosis not present

## 2019-05-18 ENCOUNTER — Encounter: Payer: Self-pay | Admitting: *Deleted

## 2019-05-19 ENCOUNTER — Other Ambulatory Visit: Payer: Self-pay

## 2019-05-19 ENCOUNTER — Inpatient Hospital Stay: Payer: Medicare HMO | Attending: Oncology

## 2019-05-19 DIAGNOSIS — D729 Disorder of white blood cells, unspecified: Secondary | ICD-10-CM | POA: Insufficient documentation

## 2019-05-19 LAB — CBC WITH DIFFERENTIAL/PLATELET
Abs Immature Granulocytes: 0.07 10*3/uL (ref 0.00–0.07)
Basophils Absolute: 0.1 10*3/uL (ref 0.0–0.1)
Basophils Relative: 1 %
Eosinophils Absolute: 0.7 10*3/uL — ABNORMAL HIGH (ref 0.0–0.5)
Eosinophils Relative: 6 %
HCT: 41.7 % (ref 39.0–52.0)
Hemoglobin: 13.2 g/dL (ref 13.0–17.0)
Immature Granulocytes: 1 %
Lymphocytes Relative: 19 %
Lymphs Abs: 2.3 10*3/uL (ref 0.7–4.0)
MCH: 29.8 pg (ref 26.0–34.0)
MCHC: 31.7 g/dL (ref 30.0–36.0)
MCV: 94.1 fL (ref 80.0–100.0)
Monocytes Absolute: 0.9 10*3/uL (ref 0.1–1.0)
Monocytes Relative: 7 %
Neutro Abs: 8 10*3/uL — ABNORMAL HIGH (ref 1.7–7.7)
Neutrophils Relative %: 66 %
Platelets: 254 10*3/uL (ref 150–400)
RBC: 4.43 MIL/uL (ref 4.22–5.81)
RDW: 12.7 % (ref 11.5–15.5)
WBC: 11.9 10*3/uL — ABNORMAL HIGH (ref 4.0–10.5)
nRBC: 0 % (ref 0.0–0.2)

## 2019-05-23 ENCOUNTER — Other Ambulatory Visit: Payer: Self-pay

## 2019-05-23 ENCOUNTER — Other Ambulatory Visit
Admission: RE | Admit: 2019-05-23 | Discharge: 2019-05-23 | Disposition: A | Discharge status: Home / Self Care | Payer: Medicare HMO | Source: Ambulatory Visit | Attending: Ophthalmology | Admitting: Ophthalmology

## 2019-05-23 ENCOUNTER — Other Ambulatory Visit
Admission: RE | Admit: 2019-05-23 | Discharge: 2019-05-23 | Disposition: A | Discharge status: Home / Self Care | Payer: Medicare HMO | Source: Ambulatory Visit | Attending: Student | Admitting: Student

## 2019-05-23 DIAGNOSIS — Z01812 Encounter for preprocedural laboratory examination: Secondary | ICD-10-CM | POA: Insufficient documentation

## 2019-05-23 DIAGNOSIS — Z20828 Contact with and (suspected) exposure to other viral communicable diseases: Secondary | ICD-10-CM | POA: Insufficient documentation

## 2019-05-23 LAB — C DIFFICILE QUICK SCREEN W PCR REFLEX
C Diff antigen: NEGATIVE
C Diff interpretation: NOT DETECTED
C Diff toxin: NEGATIVE

## 2019-05-24 LAB — SARS CORONAVIRUS 2 (TAT 6-24 HRS): SARS Coronavirus 2: NEGATIVE

## 2019-05-25 LAB — OVA + PARASITE EXAM

## 2019-05-25 LAB — O&P RESULT

## 2019-05-26 ENCOUNTER — Encounter: Payer: Self-pay | Admitting: Ophthalmology

## 2019-05-26 ENCOUNTER — Ambulatory Visit: Payer: Medicare HMO | Admitting: Certified Registered Nurse Anesthetist

## 2019-05-26 ENCOUNTER — Other Ambulatory Visit: Payer: Self-pay

## 2019-05-26 ENCOUNTER — Ambulatory Visit
Admission: RE | Admit: 2019-05-26 | Discharge: 2019-05-26 | Disposition: A | Discharge status: Home / Self Care | Payer: Medicare HMO | Attending: Ophthalmology | Admitting: Ophthalmology

## 2019-05-26 ENCOUNTER — Encounter
Admission: RE | Disposition: A | Discharge status: Home / Self Care | Payer: Self-pay | Source: Home / Self Care | Attending: Ophthalmology

## 2019-05-26 DIAGNOSIS — F0390 Unspecified dementia without behavioral disturbance: Secondary | ICD-10-CM | POA: Insufficient documentation

## 2019-05-26 DIAGNOSIS — E1122 Type 2 diabetes mellitus with diabetic chronic kidney disease: Secondary | ICD-10-CM | POA: Insufficient documentation

## 2019-05-26 DIAGNOSIS — N400 Enlarged prostate without lower urinary tract symptoms: Secondary | ICD-10-CM | POA: Insufficient documentation

## 2019-05-26 DIAGNOSIS — R05 Cough: Secondary | ICD-10-CM | POA: Diagnosis not present

## 2019-05-26 DIAGNOSIS — Z885 Allergy status to narcotic agent status: Secondary | ICD-10-CM | POA: Insufficient documentation

## 2019-05-26 DIAGNOSIS — G4730 Sleep apnea, unspecified: Secondary | ICD-10-CM | POA: Diagnosis not present

## 2019-05-26 DIAGNOSIS — Z8673 Personal history of transient ischemic attack (TIA), and cerebral infarction without residual deficits: Secondary | ICD-10-CM | POA: Insufficient documentation

## 2019-05-26 DIAGNOSIS — E1136 Type 2 diabetes mellitus with diabetic cataract: Secondary | ICD-10-CM | POA: Diagnosis not present

## 2019-05-26 DIAGNOSIS — H2512 Age-related nuclear cataract, left eye: Secondary | ICD-10-CM | POA: Diagnosis not present

## 2019-05-26 DIAGNOSIS — Z7982 Long term (current) use of aspirin: Secondary | ICD-10-CM | POA: Insufficient documentation

## 2019-05-26 DIAGNOSIS — I509 Heart failure, unspecified: Secondary | ICD-10-CM | POA: Insufficient documentation

## 2019-05-26 DIAGNOSIS — E1142 Type 2 diabetes mellitus with diabetic polyneuropathy: Secondary | ICD-10-CM | POA: Diagnosis not present

## 2019-05-26 DIAGNOSIS — F329 Major depressive disorder, single episode, unspecified: Secondary | ICD-10-CM | POA: Insufficient documentation

## 2019-05-26 DIAGNOSIS — Z79899 Other long term (current) drug therapy: Secondary | ICD-10-CM | POA: Diagnosis not present

## 2019-05-26 DIAGNOSIS — J449 Chronic obstructive pulmonary disease, unspecified: Secondary | ICD-10-CM | POA: Insufficient documentation

## 2019-05-26 DIAGNOSIS — N183 Chronic kidney disease, stage 3 (moderate): Secondary | ICD-10-CM | POA: Insufficient documentation

## 2019-05-26 DIAGNOSIS — Z9981 Dependence on supplemental oxygen: Secondary | ICD-10-CM | POA: Insufficient documentation

## 2019-05-26 DIAGNOSIS — M109 Gout, unspecified: Secondary | ICD-10-CM | POA: Insufficient documentation

## 2019-05-26 DIAGNOSIS — K219 Gastro-esophageal reflux disease without esophagitis: Secondary | ICD-10-CM | POA: Diagnosis not present

## 2019-05-26 DIAGNOSIS — I130 Hypertensive heart and chronic kidney disease with heart failure and stage 1 through stage 4 chronic kidney disease, or unspecified chronic kidney disease: Secondary | ICD-10-CM | POA: Diagnosis not present

## 2019-05-26 DIAGNOSIS — M1990 Unspecified osteoarthritis, unspecified site: Secondary | ICD-10-CM | POA: Diagnosis not present

## 2019-05-26 DIAGNOSIS — I5032 Chronic diastolic (congestive) heart failure: Secondary | ICD-10-CM | POA: Diagnosis not present

## 2019-05-26 DIAGNOSIS — E785 Hyperlipidemia, unspecified: Secondary | ICD-10-CM | POA: Diagnosis not present

## 2019-05-26 DIAGNOSIS — Z794 Long term (current) use of insulin: Secondary | ICD-10-CM | POA: Diagnosis not present

## 2019-05-26 DIAGNOSIS — Z8679 Personal history of other diseases of the circulatory system: Secondary | ICD-10-CM | POA: Diagnosis not present

## 2019-05-26 HISTORY — DX: Sleep apnea, unspecified: G47.30

## 2019-05-26 HISTORY — PX: CATARACT EXTRACTION W/PHACO: SHX586

## 2019-05-26 HISTORY — DX: Depression, unspecified: F32.A

## 2019-05-26 LAB — GLUCOSE, CAPILLARY
Glucose-Capillary: 166 mg/dL — ABNORMAL HIGH (ref 70–99)
Glucose-Capillary: 154 mg/dL — ABNORMAL HIGH (ref 70–99)

## 2019-05-26 SURGERY — PHACOEMULSIFICATION, CATARACT, WITH IOL INSERTION
Anesthesia: Monitor Anesthesia Care | Site: Eye | Laterality: Left

## 2019-05-26 MED ORDER — CARBACHOL 0.01 % IO SOLN
INTRAOCULAR | Status: DC | PRN
  Administered 2019-05-26: 0.5 mL via INTRAOCULAR

## 2019-05-26 MED ORDER — LIDOCAINE HCL (PF) 4 % IJ SOLN
INTRAOCULAR | Status: DC | PRN
  Administered 2019-05-26: 09:00:00 4 mL via OPHTHALMIC

## 2019-05-26 MED ORDER — SODIUM CHLORIDE 0.9 % IV SOLN
INTRAVENOUS | Status: DC
  Administered 2019-05-26: 07:00:00 via INTRAVENOUS

## 2019-05-26 MED ORDER — TETRACAINE HCL 0.5 % OP SOLN
OPHTHALMIC | Status: AC
  Administered 2019-05-26: 1 [drp] via OPHTHALMIC
  Filled 2019-05-26: qty 4

## 2019-05-26 MED ORDER — FENTANYL CITRATE (PF) 100 MCG/2ML IJ SOLN
INTRAMUSCULAR | Status: DC | PRN
  Administered 2019-05-26: 25 ug via INTRAVENOUS

## 2019-05-26 MED ORDER — ONDANSETRON HCL 4 MG/2ML IJ SOLN
INTRAMUSCULAR | Status: DC | PRN
  Administered 2019-05-26: 4 mg via INTRAVENOUS

## 2019-05-26 MED ORDER — POVIDONE-IODINE 5 % OP SOLN
OPHTHALMIC | Status: DC | PRN
  Administered 2019-05-26: 1 via OPHTHALMIC

## 2019-05-26 MED ORDER — MOXIFLOXACIN HCL 0.5 % OP SOLN
OPHTHALMIC | Status: DC | PRN
  Administered 2019-05-26: 0.2 mL via OPHTHALMIC

## 2019-05-26 MED ORDER — SODIUM HYALURONATE 10 MG/ML IO SOLN
INTRAOCULAR | Status: DC | PRN
  Administered 2019-05-26: 0.55 mL via INTRAOCULAR

## 2019-05-26 MED ORDER — EPINEPHRINE PF 1 MG/ML IJ SOLN
INTRAOCULAR | Status: DC | PRN
  Administered 2019-05-26: 1 mL via OPHTHALMIC

## 2019-05-26 MED ORDER — MOXIFLOXACIN HCL 0.5 % OP SOLN - NO CHARGE
1.0000 [drp] | Freq: Once | OPHTHALMIC | Status: DC
  Filled 2019-05-26: qty 3

## 2019-05-26 MED ORDER — MIDAZOLAM HCL 2 MG/2ML IJ SOLN
INTRAMUSCULAR | Status: AC
  Filled 2019-05-26: qty 2

## 2019-05-26 MED ORDER — FENTANYL CITRATE (PF) 100 MCG/2ML IJ SOLN
INTRAMUSCULAR | Status: AC
  Filled 2019-05-26: qty 2

## 2019-05-26 MED ORDER — SODIUM HYALURONATE 23 MG/ML IO SOLN
INTRAOCULAR | Status: DC | PRN
  Administered 2019-05-26: 0.6 mL via INTRAOCULAR

## 2019-05-26 MED ORDER — ARMC OPHTHALMIC DILATING DROPS
1.0000 "application " | OPHTHALMIC | Status: AC
  Administered 2019-05-26 (×3): 1 via OPHTHALMIC

## 2019-05-26 MED ORDER — MIDAZOLAM HCL 2 MG/2ML IJ SOLN
INTRAMUSCULAR | Status: DC | PRN
  Administered 2019-05-26: 0.5 mg via INTRAVENOUS

## 2019-05-26 MED ORDER — MOXIFLOXACIN HCL 0.5 % OP SOLN
OPHTHALMIC | Status: AC
  Filled 2019-05-26: qty 3

## 2019-05-26 MED ORDER — ARMC OPHTHALMIC DILATING DROPS
OPHTHALMIC | Status: AC
  Administered 2019-05-26: 1 via OPHTHALMIC
  Filled 2019-05-26: qty 0.5

## 2019-05-26 MED ORDER — TETRACAINE HCL 0.5 % OP SOLN
1.0000 [drp] | OPHTHALMIC | Status: DC | PRN
  Administered 2019-05-26: 07:00:00 1 [drp] via OPHTHALMIC

## 2019-05-26 SURGICAL SUPPLY — 16 items

## 2019-05-26 NOTE — Anesthesia Postprocedure Evaluation (Signed)
Anesthesia Post Note  Patient: [place]  Procedure(s) Performed: CATARACT EXTRACTION PHACO AND INTRAOCULAR LENS PLACEMENT (IOC) LEFT (Left Eye)  Patient location during evaluation: PACU Anesthesia Type: MAC Level of consciousness: awake and alert Pain management: pain level controlled Vital Signs Assessment: post-procedure vital signs reviewed and stable Respiratory status: spontaneous breathing, nonlabored ventilation and respiratory function stable Cardiovascular status: stable and blood pressure returned to baseline Postop Assessment: no apparent nausea or vomiting Anesthetic complications: no     Last Vitals:  Vitals:   05/26/19 0657 05/26/19 0906  BP: 107/67 119/62  Pulse: 93 84  Resp: 17 16  Temp: (!) 36.4 C 36.4 C  SpO2: 97% 94%    Last Pain:  Vitals:   05/26/19 0906  TempSrc: Temporal  PainSc: 0-No pain                 Durenda Hurt

## 2019-05-26 NOTE — H&P (Signed)

## 2019-05-26 NOTE — Transfer of Care (Signed)
Immediate Anesthesia Transfer of Care Note  Patient: Michael Wright  Procedure(s) Performed: CATARACT EXTRACTION PHACO AND INTRAOCULAR LENS PLACEMENT (IOC) LEFT (Left Eye)  Patient Location: PACU  Anesthesia Type:MAC  Level of Consciousness: awake, alert  and oriented  Airway & Oxygen Therapy: Patient Spontanous Breathing and Patient connected to nasal cannula oxygen  Post-op Assessment: Report given to RN and Post -op Vital signs reviewed and stable  Post vital signs: Reviewed and stable  Last Vitals:  Vitals Value Taken Time  BP    Temp    Pulse    Resp    SpO2      Last Pain:  Vitals:   05/26/19 0657  TempSrc: Temporal  PainSc: 0-No pain         Complications: No apparent anesthesia complications

## 2019-05-26 NOTE — Op Note (Signed)
OPERATIVE NOTE  Michael Wright [id number] 05/26/2019   PREOPERATIVE DIAGNOSIS:  Nuclear sclerotic cataract left eye.  H25.12   POSTOPERATIVE DIAGNOSIS:    Nuclear sclerotic cataract left eye.     PROCEDURE:  Phacoemusification with posterior chamber intraocular lens placement of the left eye   LENS:   Implant Name Type Inv. Item Serial No. Manufacturer Lot No. LRB No. Used Action  LENS IOL DIOP 21.0 - [id number] Intraocular Lens LENS IOL DIOP 21.0 [id number] AMO  Left 1 Implanted      Procedure(s) with comments: CATARACT EXTRACTION PHACO AND INTRAOCULAR LENS PLACEMENT (IOC) LEFT (Left) - [place] 01:34.8 AP% 17.83 CDE 35.4 Fluid Pack Lot # [id number]    SURGEON:  Benay Pillow, MD, MPH   ANESTHESIA:  Topical with tetracaine drops augmented with 1% preservative-free intracameral lidocaine.  ESTIMATED BLOOD LOSS: <1 mL   COMPLICATIONS:  None.   DESCRIPTION OF PROCEDURE:  The patient was identified in the holding room and transported to the operating room and placed in the supine position under the operating microscope.  The left eye was identified as the operative eye and it was prepped and draped in the usual sterile ophthalmic fashion.   A 1.0 millimeter clear-corneal paracentesis was made at the 5:00 position. 0.5 ml of preservative-free 1% lidocaine with epinephrine was injected into the anterior chamber.  The anterior chamber was filled with Healon 5 viscoelastic.  A 2.4 millimeter keratome was used to make a near-clear corneal incision at the 2:00 position.  A curvilinear capsulorrhexis was made with a cystotome and capsulorrhexis forceps.  Balanced salt solution was used to hydrodissect and hydrodelineate the nucleus.   Phacoemulsification was then used in stop and chop fashion to remove the lens nucleus and epinucleus.  The remaining cortex was then removed using the irrigation and aspiration handpiece. Healon was then placed into the capsular bag to distend it for lens  placement.  A lens was then injected into the capsular bag.  The remaining viscoelastic was aspirated.   Wounds were hydrated with balanced salt solution.  The anterior chamber was inflated to a physiologic pressure with balanced salt solution.  Intracameral vigamox 0.1 mL undiltued was injected into the eye and a drop placed onto the ocular surface.  No wound leaks were noted.  The patient was taken to the recovery room in stable condition without complications of anesthesia or surgery.  There was a small area of posterior capsular fibrosis peripherally (superiorly) that could not be polished off, but this is unlikely to be visually significant or inflammatory.  Benay Pillow 05/26/2019, 9:04 AM

## 2019-05-26 NOTE — Anesthesia Preprocedure Evaluation (Signed)
Anesthesia Evaluation  Patient identified by MRN, date of birth, ID band Patient awake    Reviewed: Allergy & Precautions, H&P , NPO status , Patient's Chart, lab work & pertinent test results  History of Anesthesia Complications Negative for: history of anesthetic complications  Airway Mallampati: II  TM Distance: >3 FB     Dental  (+) Chipped   Pulmonary asthma , sleep apnea , COPD,  oxygen dependent,           Cardiovascular hypertension, (-) angina+CHF (chronic diastolic heart failure)  (-) Past MI and (-) Cardiac Stents      Neuro/Psych PSYCHIATRIC DISORDERS Depression Dementia TIACVA, No Residual Symptoms    GI/Hepatic Neg liver ROS, GERD  Controlled,  Endo/Other  diabetes  Renal/GU CRFRenal disease     Musculoskeletal   Abdominal   Peds  Hematology  (+) Blood dyscrasia, anemia ,   Anesthesia Other Findings Past Medical History: No date: Anemia No date: Arthritis     Comment:  shoulders, knees, lower spine No date: BPH (benign prostatic hyperplasia) 03/83/3383: Chronic diastolic (congestive) heart failure (HCC) No date: Chronic kidney disease (CKD), stage III (moderate) (HCC) No date: Cough No date: Dementia (HCC) No date: Depression No date: Diabetes mellitus, type 2 (HCC) No date: GERD (gastroesophageal reflux disease) No date: Gout No date: Hyperlipidemia No date: Hypertension No date: Kidney stones No date: Neuropathy     Comment:  feet No date: Sleep apnea     Comment:  C-PAP, OXYGEN USE AT NIGHT 2016: TIA (transient ischemic attack)     Comment:  "several"  Past Surgical History: at age 26: ADENOIDECTOMY No date: CATARACT EXTRACTION 1990'S: SHOULDER SURGERY; Right No date: TONSILLECTOMY No date: VESICOSTOMY  BMI    Body Mass Index: 32.87 kg/m      Reproductive/Obstetrics negative OB ROS                             Anesthesia Physical Anesthesia  Plan  ASA: III  Anesthesia Plan: MAC   Post-op Pain Management:    Induction:   PONV Risk Score and Plan:   Airway Management Planned: Nasal Cannula and Natural Airway  Additional Equipment:   Intra-op Plan:   Post-operative Plan:   Informed Consent: I have reviewed the patients History and Physical, chart, labs and discussed the procedure including the risks, benefits and alternatives for the proposed anesthesia with the patient or authorized representative who has indicated his/her understanding and acceptance.       Plan Discussed with: Anesthesiologist and CRNA  Anesthesia Plan Comments:         Anesthesia Quick Evaluation

## 2019-05-26 NOTE — Discharge Instructions (Addendum)
Eye Surgery Discharge Instructions  Expect mild scratchy sensation or mild soreness. DO NOT RUB YOUR EYE!  The day of surgery:  Minimal physical activity, but bed rest is not required  No reading, computer work, or close hand work  No bending, lifting, or straining.  May watch TV  For 24 hours:  No driving, legal decisions, or alcoholic beverages  Safety precautions  Eat anything you prefer: It is better to start with liquids, then soup then solid foods.  Solar shield eyeglasses should be worn for comfort in the sunlight/patch while sleeping  Resume all regular medications including aspirin or Coumadin if these were discontinued prior to surgery. You may shower, bathe, shave, or wash your hair. Tylenol may be taken for mild discomfort. Follow eye drop instruction sheet as reviewed.  Call your doctor if you experience significant pain, nausea, or vomiting, fever > 101 or other signs of infection. [phone] or [phone] Specific instructions:  Follow-up Information    Eulogio Bear, MD Follow up.   Specialty: Ophthalmology Why: 05-26-19 @ 4:00 pm Contact information: [address] [place] [address] [phone]

## 2019-05-26 NOTE — Anesthesia Post-op Follow-up Note (Signed)
Anesthesia QCDR form completed.        

## 2019-05-28 DIAGNOSIS — J449 Chronic obstructive pulmonary disease, unspecified: Secondary | ICD-10-CM | POA: Diagnosis not present

## 2019-05-28 DIAGNOSIS — R0689 Other abnormalities of breathing: Secondary | ICD-10-CM | POA: Diagnosis not present

## 2019-05-28 DIAGNOSIS — J440 Chronic obstructive pulmonary disease with acute lower respiratory infection: Secondary | ICD-10-CM | POA: Diagnosis not present

## 2019-05-28 DIAGNOSIS — J45909 Unspecified asthma, uncomplicated: Secondary | ICD-10-CM | POA: Diagnosis not present

## 2019-05-28 LAB — STOOL CULTURE REFLEX - CMPCXR

## 2019-05-28 LAB — STOOL CULTURE: E coli, Shiga toxin Assay: NEGATIVE

## 2019-05-28 LAB — STOOL CULTURE REFLEX - RSASHR

## 2019-05-30 ENCOUNTER — Encounter: Payer: Self-pay | Admitting: Ophthalmology

## 2019-05-30 LAB — PANCREATIC ELASTASE, FECAL: Pancreatic Elastase-1, Stool: >500 ug Elast./g (ref >200)

## 2019-06-01 LAB — CALPROTECTIN, FECAL: Calprotectin, Fecal: 107 ug/g (ref 0–120)

## 2019-06-27 DIAGNOSIS — J440 Chronic obstructive pulmonary disease with acute lower respiratory infection: Secondary | ICD-10-CM | POA: Diagnosis not present

## 2019-06-27 DIAGNOSIS — J449 Chronic obstructive pulmonary disease, unspecified: Secondary | ICD-10-CM | POA: Diagnosis not present

## 2019-06-27 DIAGNOSIS — J45909 Unspecified asthma, uncomplicated: Secondary | ICD-10-CM | POA: Diagnosis not present

## 2019-06-27 DIAGNOSIS — R0689 Other abnormalities of breathing: Secondary | ICD-10-CM | POA: Diagnosis not present

## 2019-06-29 ENCOUNTER — Other Ambulatory Visit
Admission: RE | Admit: 2019-06-29 | Discharge: 2019-06-29 | Disposition: A | Discharge status: Home / Self Care | Payer: Medicare HMO | Source: Ambulatory Visit | Attending: Orthopedic Surgery | Admitting: Orthopedic Surgery

## 2019-06-29 DIAGNOSIS — R197 Diarrhea, unspecified: Secondary | ICD-10-CM | POA: Diagnosis not present

## 2019-06-29 DIAGNOSIS — R899 Unspecified abnormal finding in specimens from other organs, systems and tissues: Secondary | ICD-10-CM | POA: Insufficient documentation

## 2019-06-30 LAB — CALPROTECTIN, FECAL: Calprotectin, Fecal: 34 ug/g (ref 0–120)

## 2019-07-07 DIAGNOSIS — N1831 Chronic kidney disease, stage 3a: Secondary | ICD-10-CM | POA: Diagnosis not present

## 2019-07-07 DIAGNOSIS — E782 Mixed hyperlipidemia: Secondary | ICD-10-CM | POA: Diagnosis not present

## 2019-07-07 DIAGNOSIS — E669 Obesity, unspecified: Secondary | ICD-10-CM | POA: Diagnosis not present

## 2019-07-07 DIAGNOSIS — Z23 Encounter for immunization: Secondary | ICD-10-CM | POA: Diagnosis not present

## 2019-07-07 DIAGNOSIS — I10 Essential (primary) hypertension: Secondary | ICD-10-CM | POA: Diagnosis not present

## 2019-07-07 DIAGNOSIS — E1165 Type 2 diabetes mellitus with hyperglycemia: Secondary | ICD-10-CM | POA: Diagnosis not present

## 2019-07-07 DIAGNOSIS — Z794 Long term (current) use of insulin: Secondary | ICD-10-CM | POA: Diagnosis not present

## 2019-07-07 DIAGNOSIS — E1159 Type 2 diabetes mellitus with other circulatory complications: Secondary | ICD-10-CM | POA: Diagnosis not present

## 2019-07-18 ENCOUNTER — Other Ambulatory Visit: Payer: Self-pay

## 2019-07-18 NOTE — Patient Outreach (Signed)
[facility]) Care Management  [place]  07/18/2019   Michael Wright 11/12/1940 [id number]  Subjective: Telephone call to patient for quarterly check in. Patient states he gets lonely sometime by himself but does have visits from daughter and then goes to see his other daughter at times.  Patient reports that he maintain safety from virus by limiting outings, wearing a mask, avoiding crowds, and washing hands.  Patient reports that his breathing is good.  He states that his A1c was around 6 on last check and doctor was pleased.  Encouraged patient to continue daily regimen and stay safe. He verbalized understanding and denies any needs at this time.   Objective:   Encounter Medications:  Outpatient Encounter Medications as of 07/18/2019  Medication Sig  . allopurinol (ZYLOPRIM) 100 MG tablet Take 100 mg by mouth 2 (two) times daily.   Marland Kitchen aspirin EC 81 MG tablet Take 81 mg by mouth daily.  Marland Kitchen bismuth subsalicylate (PEPTO BISMOL) 262 MG/15ML suspension Take 30 mLs by mouth every 6 (six) hours as needed.  . carvedilol (COREG) 3.125 MG tablet Take 3.125 mg by mouth 2 (two) times daily with a meal.  . colchicine 0.6 MG tablet Take 0.6 mg by mouth 2 (two) times daily as needed (for gout flares).  . Cyanocobalamin 3000 MCG SUBL Place 3,000 mcg under the tongue daily at 12 noon.  . finasteride (PROSCAR) 5 MG tablet Take 1 tablet (5 mg total) by mouth daily.  . furosemide (LASIX) 20 MG tablet Take 20 mg by mouth daily.  Marland Kitchen guaiFENesin (MUCINEX PO) Take 400 mg by mouth daily as needed.  . insulin glargine (LANTUS) 100 unit/mL SOPN Inject 0.36 mLs (36 Units total) into the skin at bedtime. (Patient taking differently: Inject 42 Units into the skin at bedtime. )  . ipratropium-albuterol (DUONEB) 0.5-2.5 (3) MG/3ML SOLN Take 3 mLs by nebulization every 6 (six) hours as needed (For wheezing and shortness of breath).  . loratadine (CLARITIN) 10 MG tablet Take 10 mg by mouth  daily.  Marland Kitchen losartan (COZAAR) 25 MG tablet Take 25 mg by mouth daily.  . metFORMIN (GLUCOPHAGE-XR) 500 MG 24 hr tablet Take 500 mg by mouth 2 (two) times daily.  . OXYGEN Inhale into the lungs at bedtime as needed.  . pantoprazole (PROTONIX) 40 MG tablet Take 1 tablet (40 mg total) by mouth daily. (Patient taking differently: Take 40 mg by mouth 2 (two) times daily. )  . potassium chloride 20 MEQ TBCR Take 20 mEq by mouth daily. While on lasix  . pravastatin (PRAVACHOL) 40 MG tablet Take 40 mg by mouth at bedtime.   . traZODone (DESYREL) 50 MG tablet Take 1 tablet (50 mg total) by mouth at bedtime.  . Turmeric 500 MG CAPS Take 1 Dose by mouth daily.  Marland Kitchen donepezil (ARICEPT) 10 MG tablet Take 10 mg by mouth daily.   . furosemide (LASIX) 40 MG tablet Take 1 tablet (40 mg total) by mouth daily. (Patient not taking: Reported on 05/18/2019)  . HYDROcodone-homatropine (HYCODAN) 5-1.5 MG/5ML syrup Take 3 mLs by mouth every 6 (six) hours as needed for cough. (Patient not taking: Reported on 05/18/2019)   No facility-administered encounter medications on file as of 07/18/2019.     Functional Status:  In your present state of health, do you have any difficulty performing the following activities: 07/18/2019 11/22/2018  Hearing? N N  Vision? N Y  Comment - patient has cataract that he needs surgery and is unable  to see out of one eye at this time.   Difficulty concentrating or making decisions? N N  Walking or climbing stairs? Y Y  Comment leg weakness -  Dressing or bathing? N Y  Doing errands, shopping? Tempie Donning  Comment daughter assists -  Conservation officer, nature and eating ? N Y  Comment - -  Using the Toilet? N N  In the past six months, have you accidently leaked urine? N N  Do you have problems with loss of bowel control? N N  Managing your Medications? Tempie Donning  Comment family assists family assists   Managing your Finances? Y Y  Comment - family  Housekeeping or managing your Housekeeping? Y Y  Comment -  family  Some recent data might be hidden    Fall/Depression Screening: Fall Risk  07/18/2019 04/25/2019 01/25/2019  Falls in the past year? 1 0 0  Comment - - -  Number falls in past yr: 1 0 -  Injury with Fall? 0 0 -  Comment - - -  Risk for fall due to : History of fall(s) Impaired balance/gait;Impaired mobility -  Risk for fall due to: Comment - - -  Follow up Falls prevention discussed Falls evaluation completed;Education provided -   [facility] 2/9 Scores 04/25/2019 01/25/2019 11/22/2018 08/02/2018  PHQ - 2 Score 2 2 2 2   PHQ- 9 Score - 6 6 6     Assessment: Patient continues to manage chronic disease processes.   Plan:  [facility] CM Care Plan Problem Two     Most Recent Value  Care Plan Problem Two  Ongoing self-health management of chronic disease state of COPD, as evidenced by patient reporting during RN CM outreach  Role Documenting the Problem Two  Care Management Telephonic [place] for Problem Two  Active  Interventions for Problem Two Long Term Goal   Patient able to report being in the greens zone. Patient able to describe actions for increased shortness of breath.  [facility] Long Term Goal  Over the next 90 days, patient will be able to verbalize signs/ symptoms and actions for COPD exacerbation, as evidenced by patient reporting to RN CM at outreach  [place] Term Goal Start Date  07/18/19           RN CM will send update to MD. RN CM will outreach patient in the month of February and patient agreeable.   Jone Baseman, RN, MSN [place] Management Care Management Coordinator Direct Line [phone] Cell [phone] Toll Free: [phone]  Fax: [phone]

## 2019-07-19 ENCOUNTER — Ambulatory Visit: Payer: Self-pay

## 2019-07-19 ENCOUNTER — Ambulatory Visit: Payer: Medicare HMO | Admitting: *Deleted

## 2019-07-20 DIAGNOSIS — R531 Weakness: Secondary | ICD-10-CM | POA: Diagnosis not present

## 2019-07-20 DIAGNOSIS — Z79899 Other long term (current) drug therapy: Secondary | ICD-10-CM | POA: Diagnosis not present

## 2019-07-20 DIAGNOSIS — H9193 Unspecified hearing loss, bilateral: Secondary | ICD-10-CM | POA: Diagnosis not present

## 2019-07-20 DIAGNOSIS — F4321 Adjustment disorder with depressed mood: Secondary | ICD-10-CM | POA: Diagnosis not present

## 2019-07-20 DIAGNOSIS — I10 Essential (primary) hypertension: Secondary | ICD-10-CM | POA: Diagnosis not present

## 2019-07-20 DIAGNOSIS — R197 Diarrhea, unspecified: Secondary | ICD-10-CM | POA: Diagnosis not present

## 2019-07-28 DIAGNOSIS — J45909 Unspecified asthma, uncomplicated: Secondary | ICD-10-CM | POA: Diagnosis not present

## 2019-07-28 DIAGNOSIS — J449 Chronic obstructive pulmonary disease, unspecified: Secondary | ICD-10-CM | POA: Diagnosis not present

## 2019-07-28 DIAGNOSIS — J440 Chronic obstructive pulmonary disease with acute lower respiratory infection: Secondary | ICD-10-CM | POA: Diagnosis not present

## 2019-07-28 DIAGNOSIS — R0689 Other abnormalities of breathing: Secondary | ICD-10-CM | POA: Diagnosis not present

## 2019-07-31 DIAGNOSIS — I10 Essential (primary) hypertension: Secondary | ICD-10-CM | POA: Diagnosis not present

## 2019-07-31 DIAGNOSIS — N1831 Chronic kidney disease, stage 3a: Secondary | ICD-10-CM | POA: Diagnosis not present

## 2019-07-31 DIAGNOSIS — E1122 Type 2 diabetes mellitus with diabetic chronic kidney disease: Secondary | ICD-10-CM | POA: Diagnosis not present

## 2019-07-31 DIAGNOSIS — I5032 Chronic diastolic (congestive) heart failure: Secondary | ICD-10-CM | POA: Diagnosis not present

## 2019-08-18 ENCOUNTER — Inpatient Hospital Stay (HOSPITAL_BASED_OUTPATIENT_CLINIC_OR_DEPARTMENT_OTHER): Payer: Medicare HMO | Admitting: Oncology

## 2019-08-18 ENCOUNTER — Encounter: Payer: Self-pay | Admitting: Oncology

## 2019-08-18 ENCOUNTER — Other Ambulatory Visit: Payer: Self-pay

## 2019-08-18 ENCOUNTER — Inpatient Hospital Stay: Payer: Medicare HMO | Attending: Oncology

## 2019-08-18 VITALS — BP 93/63 | HR 82 | Temp 96.8°F | Resp 16 | Wt 219.5 lb

## 2019-08-18 DIAGNOSIS — D72829 Elevated white blood cell count, unspecified: Secondary | ICD-10-CM | POA: Diagnosis not present

## 2019-08-18 DIAGNOSIS — I272 Pulmonary hypertension, unspecified: Secondary | ICD-10-CM | POA: Diagnosis not present

## 2019-08-18 DIAGNOSIS — D729 Disorder of white blood cells, unspecified: Secondary | ICD-10-CM

## 2019-08-18 DIAGNOSIS — D72821 Monocytosis (symptomatic): Secondary | ICD-10-CM

## 2019-08-18 DIAGNOSIS — R5382 Chronic fatigue, unspecified: Secondary | ICD-10-CM | POA: Diagnosis not present

## 2019-08-18 DIAGNOSIS — J9610 Chronic respiratory failure, unspecified whether with hypoxia or hypercapnia: Secondary | ICD-10-CM | POA: Diagnosis not present

## 2019-08-18 DIAGNOSIS — Z9981 Dependence on supplemental oxygen: Secondary | ICD-10-CM | POA: Insufficient documentation

## 2019-08-18 LAB — CBC WITH DIFFERENTIAL/PLATELET
Abs Immature Granulocytes: 0.08 10*3/uL — ABNORMAL HIGH (ref 0.00–0.07)
Basophils Absolute: 0.1 10*3/uL (ref 0.0–0.1)
Basophils Relative: 1 %
Eosinophils Absolute: 0.5 10*3/uL (ref 0.0–0.5)
Eosinophils Relative: 4 %
HCT: 46.3 % (ref 39.0–52.0)
Hemoglobin: 14.1 g/dL (ref 13.0–17.0)
Immature Granulocytes: 1 %
Lymphocytes Relative: 18 %
Lymphs Abs: 2.4 10*3/uL (ref 0.7–4.0)
MCH: 28.8 pg (ref 26.0–34.0)
MCHC: 30.5 g/dL (ref 30.0–36.0)
MCV: 94.5 fL (ref 80.0–100.0)
Monocytes Absolute: 1 10*3/uL (ref 0.1–1.0)
Monocytes Relative: 8 %
Neutro Abs: 9.1 10*3/uL — ABNORMAL HIGH (ref 1.7–7.7)
Neutrophils Relative %: 68 %
Platelets: 271 10*3/uL (ref 150–400)
RBC: 4.9 MIL/uL (ref 4.22–5.81)
RDW: 12.8 % (ref 11.5–15.5)
WBC: 13.2 10*3/uL — ABNORMAL HIGH (ref 4.0–10.5)
nRBC: 0 % (ref 0.0–0.2)

## 2019-08-18 LAB — TECHNOLOGIST SMEAR REVIEW
Plt Morphology: ADEQUATE
RBC Morphology: NORMAL

## 2019-08-18 NOTE — Progress Notes (Signed)
Pt not having any concerns-he states that he does not know why he is here and I told him it was about his elevated wbc count

## 2019-08-23 NOTE — Progress Notes (Signed)
Hematology/Oncology Consult note [facility]  [phone] [phone]  Patient Care Team: [place] Pink, MD as PCP - General (Family Medicine) Jon Billings, RN as [place] Management   Name of the patient: Michael Wright  [id number]  05-Oct-1940   Date of visit: 08/23/19  Diagnosis- leucocytosis likely reactive  Chief complaint/ Reason for visit- routine f/u of leucocytosis  Heme/Onc history: Patient is a 78 year old male with a past medical history significant for hypertension, hyperlipidemia, pulmonary hypertension, chronic respiratory failure on oxygen unknown etiology who has been referred to [place] for leukocytosis. Most recent CBC from 01/19/2019 showed white count of 14.6, H&H of 13.8/44.5 and a platelet count of 245. Of note patient has had chronic leukocytosis for the last 45 years and his white count waxes and wanes between 10.5-17.5. Patient has been a non-smoker. He reports feeling fatigued and has no significant appetite. Reports about 10 pound weight loss over the last couple of months. He had a CT chest in November 2019 which did not show any evidence of lung cancer or PE. He was noted to have pulmonary hypertension as well as advanced three-vessel coronary artery calcifications. Noted to have groundglass opacity in his bilateral lungs for which he sees Dr. Raul Del  Results of blood work from 02/08/2019 were as follows: CBC showed white count of 14.6, H&H of 13.5/42.6 and a platelet count of 247.  Peripheral flow cytometry did not reveal any immunophenotypic abnormality.  There was some eosinophilia noted.  BCR ABL FISH testing was negative as well.   Interval history- he is doing well. He has baseline chronic fatigue that has remained unchanged. Denies other complaints  ECOG PS- 1 Pain scale- 0   Review of systems- Review of Systems  Constitutional: Positive for malaise/fatigue. Negative for  chills, fever and weight loss.  HENT: Negative for congestion, ear discharge and nosebleeds.   Eyes: Negative for blurred vision.  Respiratory: Negative for cough, hemoptysis, sputum production, shortness of breath and wheezing.   Cardiovascular: Negative for chest pain, palpitations, orthopnea and claudication.  Gastrointestinal: Negative for abdominal pain, blood in stool, constipation, diarrhea, heartburn, melena, nausea and vomiting.  Genitourinary: Negative for dysuria, flank pain, frequency, hematuria and urgency.  Musculoskeletal: Negative for back pain, joint pain and myalgias.  Skin: Negative for rash.  Neurological: Negative for dizziness, tingling, focal weakness, seizures, weakness and headaches.  Endo/Heme/Allergies: Does not bruise/bleed easily.  Psychiatric/Behavioral: Negative for depression and suicidal ideas. The patient does not have insomnia.       Allergies  Allergen Reactions  . Tramadol Other (See Comments)    Light Headedness     Past Medical History:  Diagnosis Date  . Anemia   . Arthritis    shoulders, knees, lower spine  . BPH (benign prostatic hyperplasia)   . Chronic diastolic (congestive) heart failure ([place]) 04/19/2018  . Chronic kidney disease (CKD), stage III (moderate)   . Cough   . Dementia ([place])   . Depression   . Diabetes mellitus, type 2 ([place])   . GERD (gastroesophageal reflux disease)   . Gout   . Hyperlipidemia   . Hypertension   . Kidney stones   . Neuropathy    feet  . Neutrophilia   . Sleep apnea    C-PAP, OXYGEN USE AT NIGHT  . TIA (transient ischemic attack) 2016   "several"     Past Surgical History:  Procedure Laterality Date  . ADENOIDECTOMY  at age 48  .  CATARACT EXTRACTION    . CATARACT EXTRACTION W/PHACO Left 05/26/2019   Procedure: CATARACT EXTRACTION PHACO AND INTRAOCULAR LENS PLACEMENT ([place]) LEFT;  Surgeon: Eulogio Bear, MD;  Location: [facility] ORS;  Service: Ophthalmology;  Laterality: Left;  [place] 01:34.8 AP%  17.83 CDE 35.4 Fluid Pack Lot # [id number]  . SHOULDER SURGERY Right 1990'S  . TONSILLECTOMY    . VESICOSTOMY      Social History   Socioeconomic History  . Marital status: Married    Spouse name: married  . Number of children: 3  . Years of education: Not on file  . Highest education level: Not on file  Occupational History  . Occupation: works in a shop at [employer]  . Smoking status: Never Smoker  . Smokeless tobacco: Former Systems developer    Types: Chew  Substance and Sexual Activity  . Alcohol use: No  . Drug use: No  . Sexual activity: Not on file  Other Topics Concern  . Not on file  Social History Narrative  . Not on file   Social Determinants of Health   Financial Resource Strain:   . Difficulty of Paying Living Expenses: Not on file  Food Insecurity: No Food Insecurity  . Worried About Charity fundraiser in the Last Year: Never true  . Ran Out of Food in the Last Year: Never true  Transportation Needs: No Transportation Needs  . Lack of Transportation (Medical): No  . Lack of Transportation (Non-Medical): No  Physical Activity:   . Days of Exercise per Week: Not on file  . Minutes of Exercise per Session: Not on file  Stress:   . Feeling of Stress : Not on file  Social Connections:   . Frequency of Communication with Friends and Family: Not on file  . Frequency of Social Gatherings with Friends and Family: Not on file  . Attends Religious Services: Not on file  . Active Member of Clubs or Organizations: Not on file  . Attends Archivist Meetings: Not on file  . Marital Status: Not on file  Intimate Partner Violence:   . Fear of Current or Ex-Partner: Not on file  . Emotionally Abused: Not on file  . Physically Abused: Not on file  . Sexually Abused: Not on file    Family History  Problem Relation Age of Onset  . Allergies Daughter   . Diabetes Father      Current Outpatient Medications:  .  allopurinol (ZYLOPRIM) 100 MG  tablet, Take 100 mg by mouth 2 (two) times daily. , Disp: , Rfl:  .  aspirin EC 81 MG tablet, Take 81 mg by mouth daily., Disp: , Rfl:  .  bismuth subsalicylate (PEPTO BISMOL) 262 MG/15ML suspension, Take 30 mLs by mouth every 6 (six) hours as needed., Disp: , Rfl:  .  carvedilol (COREG) 3.125 MG tablet, Take 3.125 mg by mouth 2 (two) times daily with a meal., Disp: , Rfl:  .  colchicine 0.6 MG tablet, Take 0.6 mg by mouth 2 (two) times daily as needed (for gout flares)., Disp: , Rfl:  .  Cyanocobalamin 3000 MCG SUBL, Place 3,000 mcg under the tongue daily at 12 noon., Disp: , Rfl:  .  finasteride (PROSCAR) 5 MG tablet, Take 1 tablet (5 mg total) by mouth daily., Disp: 30 tablet, Rfl: 2 .  furosemide (LASIX) 20 MG tablet, Take 20 mg by mouth daily., Disp: , Rfl:  .  insulin glargine (LANTUS) 100 unit/mL SOPN, Inject  0.36 mLs (36 Units total) into the skin at bedtime. (Patient taking differently: Inject 42 Units into the skin at bedtime. ), Disp: 15 mL, Rfl: 2 .  ipratropium-albuterol (DUONEB) 0.5-2.5 (3) MG/3ML SOLN, Take 3 mLs by nebulization every 6 (six) hours as needed (For wheezing and shortness of breath)., Disp: 360 mL, Rfl: 1 .  loratadine (CLARITIN) 10 MG tablet, Take 10 mg by mouth daily., Disp: , Rfl:  .  losartan (COZAAR) 25 MG tablet, Take 25 mg by mouth daily., Disp: , Rfl:  .  metFORMIN (GLUCOPHAGE-XR) 500 MG 24 hr tablet, Take 500 mg by mouth 2 (two) times daily., Disp: , Rfl:  .  OXYGEN, Inhale into the lungs at bedtime as needed., Disp: , Rfl:  .  pantoprazole (PROTONIX) 40 MG tablet, Take 1 tablet (40 mg total) by mouth daily. (Patient taking differently: Take 40 mg by mouth 2 (two) times daily. ), Disp: 30 tablet, Rfl: 2 .  potassium chloride 20 MEQ TBCR, Take 20 mEq by mouth daily. While on lasix, Disp: 30 tablet, Rfl: 2 .  pravastatin (PRAVACHOL) 40 MG tablet, Take 40 mg by mouth at bedtime. , Disp: , Rfl:  .  traZODone (DESYREL) 50 MG tablet, Take 1 tablet (50 mg total) by  mouth at bedtime., Disp: 30 tablet, Rfl: 2 .  donepezil (ARICEPT) 10 MG tablet, Take 10 mg by mouth daily. , Disp: , Rfl:  .  Turmeric 500 MG CAPS, Take 1 Dose by mouth daily., Disp: , Rfl:   Physical exam:  Vitals:   08/18/19 1016  BP: 93/63  Pulse: 82  Resp: 16  Temp: (!) 96.8 F (36 C)  TempSrc: Tympanic  Weight: 219 lb 8 oz (99.6 kg)   Physical Exam Constitutional:      General: He is not in acute distress. HENT:     Head: Normocephalic and atraumatic.  Eyes:     Pupils: Pupils are equal, round, and reactive to light.  Cardiovascular:     Rate and Rhythm: Normal rate and regular rhythm.     Heart sounds: Normal heart sounds.  Pulmonary:     Effort: Pulmonary effort is normal.     Breath sounds: Normal breath sounds.  Abdominal:     General: Bowel sounds are normal.     Palpations: Abdomen is soft.  Musculoskeletal:     Cervical back: Normal range of motion.  Skin:    General: Skin is warm and dry.  Neurological:     Mental Status: He is alert and oriented to person, place, and time.      CMP Latest Ref Rng & Units 07/27/2018  Glucose 70 - 99 mg/dL 149(H)  BUN 8 - 23 mg/dL 33(H)  Creatinine 0.61 - 1.24 mg/dL 1.42(H)  Sodium 135 - 145 mmol/L 142  Potassium 3.5 - 5.1 mmol/L 3.8  Chloride 98 - 111 mmol/L 94(L)  CO2 22 - 32 mmol/L 40(H)  Calcium 8.9 - 10.3 mg/dL 8.9  Total Protein 6.5 - 8.1 g/dL -  Total Bilirubin 0.3 - 1.2 mg/dL -  Alkaline Phos 38 - 126 U/L -  AST 15 - 41 U/L -  ALT 0 - 44 U/L -   CBC Latest Ref Rng & Units 08/18/2019  WBC 4.0 - 10.5 K/uL 13.2(H)  Hemoglobin 13.0 - 17.0 g/dL 14.1  Hematocrit 39.0 - 52.0 % 46.3  Platelets 150 - 400 K/uL 271     Assessment and plan- Patient is a 78 y.o. male here for routine f/u of  leucocytosis likely reactive  Patient has had mild leukocytosis that waxes and wanes between the 218 over the last 4 years apiece.  Today his wife 14.2 again with predominant neutrophilia and absolute neutrophil count of 9.1  even and platelets are within normal limits.  Continue to monitor CBC with differential every 6 months and I will see him back in 1 year   Visit Diagnosis 1. Leukocytosis, unspecified type      Dr. Randa Evens, MD, MPH [facility] at [facility] [id number] 08/23/2019 2:25 PM

## 2019-08-27 DIAGNOSIS — J440 Chronic obstructive pulmonary disease with acute lower respiratory infection: Secondary | ICD-10-CM | POA: Diagnosis not present

## 2019-08-27 DIAGNOSIS — R0689 Other abnormalities of breathing: Secondary | ICD-10-CM | POA: Diagnosis not present

## 2019-08-27 DIAGNOSIS — J449 Chronic obstructive pulmonary disease, unspecified: Secondary | ICD-10-CM | POA: Diagnosis not present

## 2019-08-27 DIAGNOSIS — J45909 Unspecified asthma, uncomplicated: Secondary | ICD-10-CM | POA: Diagnosis not present

## 2019-09-27 DIAGNOSIS — J440 Chronic obstructive pulmonary disease with acute lower respiratory infection: Secondary | ICD-10-CM | POA: Diagnosis not present

## 2019-09-27 DIAGNOSIS — J449 Chronic obstructive pulmonary disease, unspecified: Secondary | ICD-10-CM | POA: Diagnosis not present

## 2019-09-27 DIAGNOSIS — R0689 Other abnormalities of breathing: Secondary | ICD-10-CM | POA: Diagnosis not present

## 2019-09-27 DIAGNOSIS — J45909 Unspecified asthma, uncomplicated: Secondary | ICD-10-CM | POA: Diagnosis not present

## 2019-10-10 DIAGNOSIS — I10 Essential (primary) hypertension: Secondary | ICD-10-CM | POA: Diagnosis not present

## 2019-10-10 DIAGNOSIS — E1165 Type 2 diabetes mellitus with hyperglycemia: Secondary | ICD-10-CM | POA: Diagnosis not present

## 2019-10-10 DIAGNOSIS — E782 Mixed hyperlipidemia: Secondary | ICD-10-CM | POA: Diagnosis not present

## 2019-10-10 DIAGNOSIS — Z794 Long term (current) use of insulin: Secondary | ICD-10-CM | POA: Diagnosis not present

## 2019-10-10 DIAGNOSIS — N1831 Chronic kidney disease, stage 3a: Secondary | ICD-10-CM | POA: Diagnosis not present

## 2019-10-10 DIAGNOSIS — E669 Obesity, unspecified: Secondary | ICD-10-CM | POA: Diagnosis not present

## 2019-10-10 DIAGNOSIS — E1159 Type 2 diabetes mellitus with other circulatory complications: Secondary | ICD-10-CM | POA: Diagnosis not present

## 2019-10-17 ENCOUNTER — Other Ambulatory Visit: Payer: Self-pay

## 2019-10-17 NOTE — Patient Outreach (Signed)
[facility]) Care Management  [place]  10/17/2019   Jabbar L Deutscher [birth year] [id number]  Subjective: Telephone call to patient for quarterly check in. Patient reports he is doing good.  He reports that his breathing is about the same no changes.  Discussed COPD and management.  He states he had his first COVID vaccine about 1-2 weeks ago.  He is not sure of the date and which one.  He states that he is scheduled for the next one 10-19-19.  Discussed with patient COVID-19 safety despite vaccine. He verbalized understanding and voices no concerns.    Objective:   Encounter Medications:  Outpatient Encounter Medications as of 10/17/2019  Medication Sig  . allopurinol (ZYLOPRIM) 100 MG tablet Take 100 mg by mouth 2 (two) times daily.   Marland Kitchen aspirin EC 81 MG tablet Take 81 mg by mouth daily.  Marland Kitchen bismuth subsalicylate (PEPTO BISMOL) 262 MG/15ML suspension Take 30 mLs by mouth every 6 (six) hours as needed.  . carvedilol (COREG) 3.125 MG tablet Take 3.125 mg by mouth 2 (two) times daily with a meal.  . colchicine 0.6 MG tablet Take 0.6 mg by mouth 2 (two) times daily as needed (for gout flares).  . Cyanocobalamin 3000 MCG SUBL Place 3,000 mcg under the tongue daily at 12 noon.  . donepezil (ARICEPT) 10 MG tablet Take 10 mg by mouth daily.   . finasteride (PROSCAR) 5 MG tablet Take 1 tablet (5 mg total) by mouth daily.  . furosemide (LASIX) 20 MG tablet Take 20 mg by mouth daily.  . insulin glargine (LANTUS) 100 unit/mL SOPN Inject 0.36 mLs (36 Units total) into the skin at bedtime. (Patient taking differently: Inject 42 Units into the skin at bedtime. )  . ipratropium-albuterol (DUONEB) 0.5-2.5 (3) MG/3ML SOLN Take 3 mLs by nebulization every 6 (six) hours as needed (For wheezing and shortness of breath).  . loratadine (CLARITIN) 10 MG tablet Take 10 mg by mouth daily.  Marland Kitchen losartan (COZAAR) 25 MG tablet Take 25 mg by mouth daily.  . metFORMIN (GLUCOPHAGE-XR) 500 MG 24  hr tablet Take 500 mg by mouth 2 (two) times daily.  . OXYGEN Inhale into the lungs at bedtime as needed.  . pantoprazole (PROTONIX) 40 MG tablet Take 1 tablet (40 mg total) by mouth daily. (Patient taking differently: Take 40 mg by mouth 2 (two) times daily. )  . potassium chloride 20 MEQ TBCR Take 20 mEq by mouth daily. While on lasix  . pravastatin (PRAVACHOL) 40 MG tablet Take 40 mg by mouth at bedtime.   . traZODone (DESYREL) 50 MG tablet Take 1 tablet (50 mg total) by mouth at bedtime.  . Turmeric 500 MG CAPS Take 1 Dose by mouth daily.   No facility-administered encounter medications on file as of 10/17/2019.    Functional Status:  In your present state of health, do you have any difficulty performing the following activities: 07/18/2019 11/22/2018  Hearing? N N  Vision? N Y  Comment - patient has cataract that he needs surgery and is unable to see out of one eye at this time.   Difficulty concentrating or making decisions? N N  Walking or climbing stairs? Y Y  Comment leg weakness -  Dressing or bathing? N Y  Doing errands, shopping? Tempie Donning  Comment daughter assists -  Conservation officer, nature and eating ? N Y  Using the Toilet? N N  In the past six months, have you accidently leaked urine? N N  Do you have problems with loss of bowel control? N N  Managing your Medications? Tempie Donning  Comment family assists family assists   Managing your Finances? Y Y  Comment - family  Housekeeping or managing your Housekeeping? Y Y  Comment - family  Some recent data might be hidden    Fall/Depression Screening: Fall Risk  07/18/2019 04/25/2019 01/25/2019  Falls in the past year? 1 0 0  Comment - - -  Number falls in past yr: 1 0 -  Injury with Fall? 0 0 -  Comment - - -  Risk for fall due to : History of fall(s) Impaired balance/gait;Impaired mobility -  Risk for fall due to: Comment - - -  Follow up Falls prevention discussed Falls evaluation completed;Education provided -   [facility] 2/9 Scores  07/18/2019 04/25/2019 01/25/2019 11/22/2018 08/02/2018  PHQ - 2 Score 1 2 2 2 2   PHQ- 9 Score - - 6 6 6     Assessment: Patient continues to manage chronic illnesses with no hospitalizations.  Plan:  [facility] CM Care Plan Problem Two     Most Recent Value  Care Plan Problem Two  Ongoing self-health management of chronic disease state of COPD, as evidenced by patient reporting during RN CM outreach  Role Documenting the Problem Two  Care Management Telephonic [place] for Problem Two  Active  Interventions for Problem Two Long Term Goal   Patient in green zone.  Discussed COPD wellness and importance of COVID-19 vaccine.    [facility] Long Term Goal  Over the next 90 days, patient will be able to verbalize signs/ symptoms and actions for COPD exacerbation, as evidenced by patient reporting to RN CM at outreach  [place] Term Goal Start Date  10/17/19     RN CM will contact patient in the month of May and patient agreeable.    Jone Baseman, RN, MSN [place] Management Care Management Coordinator Direct Line [phone] Cell [phone] Toll Free: [phone]  Fax: [phone]

## 2019-10-28 DIAGNOSIS — R0689 Other abnormalities of breathing: Secondary | ICD-10-CM | POA: Diagnosis not present

## 2019-10-28 DIAGNOSIS — J449 Chronic obstructive pulmonary disease, unspecified: Secondary | ICD-10-CM | POA: Diagnosis not present

## 2019-10-28 DIAGNOSIS — J440 Chronic obstructive pulmonary disease with acute lower respiratory infection: Secondary | ICD-10-CM | POA: Diagnosis not present

## 2019-10-28 DIAGNOSIS — J45909 Unspecified asthma, uncomplicated: Secondary | ICD-10-CM | POA: Diagnosis not present

## 2019-11-25 DIAGNOSIS — J449 Chronic obstructive pulmonary disease, unspecified: Secondary | ICD-10-CM | POA: Diagnosis not present

## 2019-11-25 DIAGNOSIS — J440 Chronic obstructive pulmonary disease with acute lower respiratory infection: Secondary | ICD-10-CM | POA: Diagnosis not present

## 2019-11-25 DIAGNOSIS — J45909 Unspecified asthma, uncomplicated: Secondary | ICD-10-CM | POA: Diagnosis not present

## 2019-11-25 DIAGNOSIS — R0689 Other abnormalities of breathing: Secondary | ICD-10-CM | POA: Diagnosis not present

## 2019-12-04 DIAGNOSIS — M1A0690 Idiopathic chronic gout, unspecified knee, without tophus (tophi): Secondary | ICD-10-CM | POA: Diagnosis not present

## 2019-12-04 DIAGNOSIS — I129 Hypertensive chronic kidney disease with stage 1 through stage 4 chronic kidney disease, or unspecified chronic kidney disease: Secondary | ICD-10-CM | POA: Diagnosis not present

## 2019-12-04 DIAGNOSIS — I10 Essential (primary) hypertension: Secondary | ICD-10-CM | POA: Diagnosis not present

## 2019-12-04 DIAGNOSIS — N183 Chronic kidney disease, stage 3 unspecified: Secondary | ICD-10-CM | POA: Diagnosis not present

## 2019-12-04 DIAGNOSIS — E1122 Type 2 diabetes mellitus with diabetic chronic kidney disease: Secondary | ICD-10-CM | POA: Diagnosis not present

## 2019-12-07 DIAGNOSIS — I5032 Chronic diastolic (congestive) heart failure: Secondary | ICD-10-CM | POA: Diagnosis not present

## 2019-12-07 DIAGNOSIS — I10 Essential (primary) hypertension: Secondary | ICD-10-CM | POA: Diagnosis not present

## 2019-12-07 DIAGNOSIS — E1122 Type 2 diabetes mellitus with diabetic chronic kidney disease: Secondary | ICD-10-CM | POA: Diagnosis not present

## 2019-12-07 DIAGNOSIS — N1831 Chronic kidney disease, stage 3a: Secondary | ICD-10-CM | POA: Diagnosis not present

## 2019-12-07 DIAGNOSIS — R809 Proteinuria, unspecified: Secondary | ICD-10-CM | POA: Diagnosis not present

## 2019-12-11 DIAGNOSIS — E1165 Type 2 diabetes mellitus with hyperglycemia: Secondary | ICD-10-CM | POA: Diagnosis not present

## 2019-12-11 DIAGNOSIS — E1169 Type 2 diabetes mellitus with other specified complication: Secondary | ICD-10-CM | POA: Diagnosis not present

## 2019-12-11 DIAGNOSIS — N1831 Chronic kidney disease, stage 3a: Secondary | ICD-10-CM | POA: Diagnosis not present

## 2019-12-11 DIAGNOSIS — E1122 Type 2 diabetes mellitus with diabetic chronic kidney disease: Secondary | ICD-10-CM | POA: Diagnosis not present

## 2019-12-11 DIAGNOSIS — Z1331 Encounter for screening for depression: Secondary | ICD-10-CM | POA: Diagnosis not present

## 2019-12-11 DIAGNOSIS — Z0000 Encounter for general adult medical examination without abnormal findings: Secondary | ICD-10-CM | POA: Diagnosis not present

## 2019-12-11 DIAGNOSIS — E785 Hyperlipidemia, unspecified: Secondary | ICD-10-CM | POA: Diagnosis not present

## 2019-12-11 DIAGNOSIS — I130 Hypertensive heart and chronic kidney disease with heart failure and stage 1 through stage 4 chronic kidney disease, or unspecified chronic kidney disease: Secondary | ICD-10-CM | POA: Diagnosis not present

## 2019-12-22 DIAGNOSIS — I10 Essential (primary) hypertension: Secondary | ICD-10-CM | POA: Diagnosis not present

## 2019-12-22 DIAGNOSIS — N1831 Chronic kidney disease, stage 3a: Secondary | ICD-10-CM | POA: Diagnosis not present

## 2019-12-26 DIAGNOSIS — J440 Chronic obstructive pulmonary disease with acute lower respiratory infection: Secondary | ICD-10-CM | POA: Diagnosis not present

## 2019-12-26 DIAGNOSIS — J45909 Unspecified asthma, uncomplicated: Secondary | ICD-10-CM | POA: Diagnosis not present

## 2019-12-26 DIAGNOSIS — R0689 Other abnormalities of breathing: Secondary | ICD-10-CM | POA: Diagnosis not present

## 2019-12-26 DIAGNOSIS — J449 Chronic obstructive pulmonary disease, unspecified: Secondary | ICD-10-CM | POA: Diagnosis not present

## 2019-12-28 ENCOUNTER — Emergency Department: Payer: Medicare HMO

## 2019-12-28 ENCOUNTER — Encounter: Payer: Self-pay | Admitting: Emergency Medicine

## 2019-12-28 ENCOUNTER — Other Ambulatory Visit: Payer: Self-pay

## 2019-12-28 ENCOUNTER — Emergency Department
Admission: EM | Admit: 2019-12-28 | Discharge: 2019-12-28 | Disposition: A | Discharge status: Home / Self Care | Payer: Medicare HMO | Attending: Emergency Medicine | Admitting: Emergency Medicine

## 2019-12-28 DIAGNOSIS — Z8673 Personal history of transient ischemic attack (TIA), and cerebral infarction without residual deficits: Secondary | ICD-10-CM | POA: Diagnosis not present

## 2019-12-28 DIAGNOSIS — Z7982 Long term (current) use of aspirin: Secondary | ICD-10-CM | POA: Insufficient documentation

## 2019-12-28 DIAGNOSIS — R079 Chest pain, unspecified: Secondary | ICD-10-CM | POA: Diagnosis not present

## 2019-12-28 DIAGNOSIS — E1122 Type 2 diabetes mellitus with diabetic chronic kidney disease: Secondary | ICD-10-CM | POA: Diagnosis not present

## 2019-12-28 DIAGNOSIS — Z794 Long term (current) use of insulin: Secondary | ICD-10-CM | POA: Insufficient documentation

## 2019-12-28 DIAGNOSIS — I130 Hypertensive heart and chronic kidney disease with heart failure and stage 1 through stage 4 chronic kidney disease, or unspecified chronic kidney disease: Secondary | ICD-10-CM | POA: Insufficient documentation

## 2019-12-28 DIAGNOSIS — R0602 Shortness of breath: Secondary | ICD-10-CM | POA: Diagnosis not present

## 2019-12-28 DIAGNOSIS — Z9981 Dependence on supplemental oxygen: Secondary | ICD-10-CM | POA: Insufficient documentation

## 2019-12-28 DIAGNOSIS — E1140 Type 2 diabetes mellitus with diabetic neuropathy, unspecified: Secondary | ICD-10-CM | POA: Insufficient documentation

## 2019-12-28 DIAGNOSIS — Z87891 Personal history of nicotine dependence: Secondary | ICD-10-CM | POA: Insufficient documentation

## 2019-12-28 DIAGNOSIS — I5032 Chronic diastolic (congestive) heart failure: Secondary | ICD-10-CM | POA: Diagnosis not present

## 2019-12-28 DIAGNOSIS — N183 Chronic kidney disease, stage 3 unspecified: Secondary | ICD-10-CM | POA: Insufficient documentation

## 2019-12-28 DIAGNOSIS — N179 Acute kidney failure, unspecified: Secondary | ICD-10-CM

## 2019-12-28 DIAGNOSIS — Z79899 Other long term (current) drug therapy: Secondary | ICD-10-CM | POA: Insufficient documentation

## 2019-12-28 DIAGNOSIS — R635 Abnormal weight gain: Secondary | ICD-10-CM | POA: Insufficient documentation

## 2019-12-28 DIAGNOSIS — R350 Frequency of micturition: Secondary | ICD-10-CM | POA: Insufficient documentation

## 2019-12-28 LAB — BASIC METABOLIC PANEL
Anion gap: 9 (ref 5–15)
BUN: 33 mg/dL — ABNORMAL HIGH (ref 8–23)
CO2: 32 mmol/L (ref 22–32)
Calcium: 8.8 mg/dL — ABNORMAL LOW (ref 8.9–10.3)
Chloride: 101 mmol/L (ref 98–111)
Creatinine, Ser: 1.7 mg/dL — ABNORMAL HIGH (ref 0.61–1.24)
GFR calc Af Amer: 44 mL/min — ABNORMAL LOW (ref >60)
GFR calc non Af Amer: 38 mL/min — ABNORMAL LOW (ref >60)
Glucose, Bld: 176 mg/dL — ABNORMAL HIGH (ref 70–99)
Potassium: 4 mmol/L (ref 3.5–5.1)
Sodium: 142 mmol/L (ref 135–145)

## 2019-12-28 LAB — CBC
HCT: 39.9 % (ref 39.0–52.0)
Hemoglobin: 12.9 g/dL — ABNORMAL LOW (ref 13.0–17.0)
MCH: 30.2 pg (ref 26.0–34.0)
MCHC: 32.3 g/dL (ref 30.0–36.0)
MCV: 93.4 fL (ref 80.0–100.0)
Platelets: 229 10*3/uL (ref 150–400)
RBC: 4.27 MIL/uL (ref 4.22–5.81)
RDW: 12.8 % (ref 11.5–15.5)
WBC: 10.7 10*3/uL — ABNORMAL HIGH (ref 4.0–10.5)
nRBC: 0 % (ref 0.0–0.2)

## 2019-12-28 LAB — TROPONIN I (HIGH SENSITIVITY): Troponin I (High Sensitivity): 9 ng/L (ref <18)

## 2019-12-28 LAB — BRAIN NATRIURETIC PEPTIDE: B Natriuretic Peptide: 45 pg/mL (ref 0.0–100.0)

## 2019-12-28 MED ORDER — FUROSEMIDE 10 MG/ML IJ SOLN
40.0000 mg | Freq: Once | INTRAMUSCULAR | Status: DC
  Filled 2019-12-28: qty 4

## 2019-12-28 MED ORDER — IOHEXOL 350 MG/ML SOLN
60.0000 mL | Freq: Once | INTRAVENOUS | Status: AC | PRN
  Administered 2019-12-28: 18:00:00 60 mL via INTRAVENOUS

## 2019-12-28 NOTE — ED Provider Notes (Signed)
[facility] Emergency Department Provider Note   ____________________________________________   First MD Initiated Contact with Patient 12/28/19 1611     (approximate)  I have reviewed the triage vital signs and the nursing notes.   HISTORY  Chief Complaint Shortness of Breath    HPI Michael Wright is a 79 y.o. male with past medical history of hypertension, hyperlipidemia, diabetes, and CHF who presents to the ED complaining of shortness of breath.  Patient reports that he has had increasing difficulty breathing over the past 2 to 3 weeks associated with a weight gain of about 15 pounds.  He will have occasional pains in his chest that he describes as a sharp stabbing which improves as he gets up and moves around.  He describes current symptoms as similar to when he has dealt with CHF, was recently told that he should drink more water due to lab work showing he was dehydrated.  He had also recently had his Lasix dose decreased at his request due to frequent urination.  He has oxygen at home that he has supposed to wear 2 L at all times, however he typically only wears it at night.  He was referred to the ED after speaking with the nurse at his cardiologist office.        Past Medical History:  Diagnosis Date  . Anemia   . Arthritis    shoulders, knees, lower spine  . BPH (benign prostatic hyperplasia)   . Chronic diastolic (congestive) heart failure ([place]) 04/19/2018  . Chronic kidney disease (CKD), stage III (moderate)   . Cough   . Dementia ([place])   . Depression   . Diabetes mellitus, type 2 ([place])   . GERD (gastroesophageal reflux disease)   . Gout   . Hyperlipidemia   . Hypertension   . Kidney stones   . Neuropathy    feet  . Neutrophilia   . Sleep apnea    C-PAP, OXYGEN USE AT NIGHT  . TIA (transient ischemic attack) 2016   "several"    Patient Active Problem List   Diagnosis Date Noted  . Acute respiratory failure with hypoxia  and hypercarbia ([place]) 07/25/2018  . Acute pulmonary edema (HCC)   . Acute respiratory failure with hypoxia and hypercapnia ([place]) 04/19/2018  . AKI (acute kidney injury) ([place]) 05/15/2015  . Stroke ([place]) 05/15/2015  . Type 2 diabetes mellitus ([place]) 05/15/2015  . HTN (hypertension) 05/15/2015  . HLD (hyperlipidemia) 05/15/2015  . Abdominal pain, right upper quadrant 05/15/2015  . Hyperlipidemia   . Hypertension   . Anemia   . Diabetes mellitus, type 2 ([place])   . Extrinsic asthma, unspecified     Past Surgical History:  Procedure Laterality Date  . ADENOIDECTOMY  at age 89  . CATARACT EXTRACTION    . CATARACT EXTRACTION W/PHACO Left 05/26/2019   Procedure: CATARACT EXTRACTION PHACO AND INTRAOCULAR LENS PLACEMENT ([place]) LEFT;  Surgeon: Eulogio Bear, MD;  Location: [facility] ORS;  Service: Ophthalmology;  Laterality: Left;  [place] 01:34.8 AP% 17.83 CDE 35.4 Fluid Pack Lot # [id number]  . SHOULDER SURGERY Right 1990'S  . TONSILLECTOMY    . VESICOSTOMY      Prior to Admission medications   Medication Sig Start Date End Date Taking? Authorizing Provider  allopurinol (ZYLOPRIM) 100 MG tablet Take 100 mg by mouth 2 (two) times daily.     [provider]  aspirin EC 81 MG tablet Take 81 mg by mouth daily.    [provider]  bismuth subsalicylate (PEPTO BISMOL) 262 MG/15ML suspension Take 30 mLs by mouth every 6 (six) hours as needed.    [provider]  carvedilol (COREG) 3.125 MG tablet Take 3.125 mg by mouth 2 (two) times daily with a meal.    [provider]  colchicine 0.6 MG tablet Take 0.6 mg by mouth 2 (two) times daily as needed (for gout flares).    [provider]  Cyanocobalamin 3000 MCG SUBL Place 3,000 mcg under the tongue daily at 12 noon.    [provider]  donepezil (ARICEPT) 10 MG tablet Take 10 mg by mouth daily.  09/13/15 05/18/19  [provider]  finasteride (PROSCAR) 5 MG tablet Take 1 tablet (5 mg total) by mouth  daily. 04/26/18   Gladstone Lighter, MD  furosemide (LASIX) 20 MG tablet Take 20 mg by mouth daily.    [provider]  insulin glargine (LANTUS) 100 unit/mL SOPN Inject 0.36 mLs (36 Units total) into the skin at bedtime. Patient taking differently: Inject 42 Units into the skin at bedtime.  04/26/18   Gladstone Lighter, MD  ipratropium-albuterol (DUONEB) 0.5-2.5 (3) MG/3ML SOLN Take 3 mLs by nebulization every 6 (six) hours as needed (For wheezing and shortness of breath). 04/26/18   Gladstone Lighter, MD  loratadine (CLARITIN) 10 MG tablet Take 10 mg by mouth daily.    [provider]  losartan (COZAAR) 25 MG tablet Take 25 mg by mouth daily.    [provider]  metFORMIN (GLUCOPHAGE-XR) 500 MG 24 hr tablet Take 500 mg by mouth 2 (two) times daily.    [provider]  OXYGEN Inhale into the lungs at bedtime as needed.    [provider]  pantoprazole (PROTONIX) 40 MG tablet Take 1 tablet (40 mg total) by mouth daily. Patient taking differently: Take 40 mg by mouth 2 (two) times daily.  04/26/18   Gladstone Lighter, MD  potassium chloride 20 MEQ TBCR Take 20 mEq by mouth daily. While on lasix 04/26/18   Gladstone Lighter, MD  pravastatin (PRAVACHOL) 40 MG tablet Take 40 mg by mouth at bedtime.     [provider]  traZODone (DESYREL) 50 MG tablet Take 1 tablet (50 mg total) by mouth at bedtime. 04/26/18   Gladstone Lighter, MD  Turmeric 500 MG CAPS Take 1 Dose by mouth daily.    [provider]    Allergies Tramadol  Family History  Problem Relation Age of Onset  . Allergies Daughter   . Diabetes Father     Social History Social History   Tobacco Use  . Smoking status: Never Smoker  . Smokeless tobacco: Former Systems developer    Types: Chew  Substance Use Topics  . Alcohol use: No  . Drug use: No    Review of Systems  Constitutional: No fever/chills Eyes: No visual changes. ENT: No sore throat. Cardiovascular: Positive  for chest pain. Respiratory: Positive for shortness of breath. Gastrointestinal: No abdominal pain.  No nausea, no vomiting.  No diarrhea.  No constipation. Genitourinary: Negative for dysuria. Musculoskeletal: Negative for back pain. Skin: Negative for rash. Neurological: Negative for headaches, focal weakness or numbness.  ____________________________________________   PHYSICAL EXAM:  VITAL SIGNS: ED Triage Vitals  Enc Vitals Group     BP 12/28/19 1426 (!) 105/58     Pulse Rate 12/28/19 1426 78     Resp 12/28/19 1426 (!) 24     Temp 12/28/19 1426 98.6 F (37 C)  Temp Source 12/28/19 1426 Oral     SpO2 12/28/19 1426 90 %     Weight 12/28/19 1427 222 lb (100.7 kg)     Height 12/28/19 1427 5\' 7"  (1.702 m)     Head Circumference --      Peak Flow --      Pain Score 12/28/19 1427 0     Pain Loc --      Pain Edu? --      Excl. in [place]? --     Constitutional: Alert and oriented. Eyes: Conjunctivae are normal. Head: Atraumatic. Nose: No congestion/rhinnorhea. Mouth/Throat: Mucous membranes are moist. Neck: Normal ROM Cardiovascular: Normal rate, regular rhythm. Grossly normal heart sounds. Respiratory: Mild tachypnea with normal respiratory effort.  No retractions. Lungs CTAB. Gastrointestinal: Soft and nontender. No distention. Genitourinary: deferred Musculoskeletal: No lower extremity tenderness nor edema. Neurologic:  Normal speech and language. No gross focal neurologic deficits are appreciated. Skin:  Skin is warm, dry and intact. No rash noted. Psychiatric: Mood and affect are normal. Speech and behavior are normal.  ____________________________________________   LABS (all labs ordered are listed, but only abnormal results are displayed)  Labs Reviewed  BASIC METABOLIC PANEL - Abnormal; Notable for the following components:      Result Value   Glucose, Bld 176 (*)    BUN 33 (*)    Creatinine, Ser 1.70 (*)    Calcium 8.8 (*)    GFR calc non Af Amer 38 (*)     GFR calc Af Amer 44 (*)    All other components within normal limits  CBC - Abnormal; Notable for the following components:   WBC 10.7 (*)    Hemoglobin 12.9 (*)    All other components within normal limits  RESPIRATORY PANEL BY RT PCR (FLU A&B, COVID)  BRAIN NATRIURETIC PEPTIDE  TROPONIN I (HIGH SENSITIVITY)  TROPONIN I (HIGH SENSITIVITY)   ____________________________________________  EKG  ED ECG REPORT I, Blake Divine, the attending physician, personally viewed and interpreted this ECG.   Date: 12/28/2019  EKG Time: 14:31  Rate: 88  Rhythm: normal sinus rhythm  Axis: Normal  Intervals:right bundle branch block  ST&T Change: None   PROCEDURES  Procedure(s) performed (including Critical Care):  .1-3 Lead EKG Interpretation Performed by: Blake Divine, MD Authorized by: Blake Divine, MD     Interpretation: normal     ECG rate:  64   ECG rate assessment: normal     Rhythm: sinus rhythm     Ectopy: none     Conduction: normal       ____________________________________________   INITIAL IMPRESSION / ASSESSMENT AND PLAN / ED COURSE       79 year old male with history of hypertension, hyperlipidemia, diabetes, and CHF presents to the ED with 2 to 3 weeks of increasing difficulty breathing as well as weight gain.  While his symptoms sound most consistent with CHF, he does not clinically appear fluid overloaded and chest x-ray shows no evidence of pulmonary edema.  There is no evidence of pneumonia and his symptoms sound atypical for ACS.  EKG shows no evidence of arrhythmia or ischemia, we will screen 2 sets troponin given his reported chest pain.  Given no findings to confirm CHF at this point, we will further assess with CTA to rule out PE.  Patient is maintaining his O2 sats on his usual 2 L nasal cannula.  CTA is negative for PE and shows no evidence of pulmonary edema.  Patient was ambulated on his  usual 2 L nasal cannula and briefly desatted to 89%, but  states that he felt well with minimal difficulty breathing.  He was offered admission to address his worsening difficulty breathing as well as mild AKI, but he declined.  He prefers to follow-up with his PCP as well as nephrology, was counseled to return to the ED for new or worsening symptoms.  Patient agrees with plan.        ____________________________________________   FINAL CLINICAL IMPRESSION(S) / ED DIAGNOSES  Final diagnoses:  Shortness of breath  AKI (acute kidney injury) [facility])     ED Discharge Orders    None       Note:  This document was prepared using Dragon voice recognition software and may include unintentional dictation errors.   Blake Divine, MD 12/28/19 2019

## 2019-12-28 NOTE — ED Notes (Signed)
Patient off nit to ct angio

## 2019-12-28 NOTE — ED Notes (Signed)
PT requesting to go home and monitor self. Family in agreement with plan.

## 2019-12-28 NOTE — ED Notes (Signed)
MD at bedside. 

## 2019-12-28 NOTE — ED Triage Notes (Signed)
Patient reports hx of CHF. States he has been gradually gaining weight with a total of 15 lb over 2-3 weeks. Patient reports worsening SOB and fatigue as well. Patient states he has been compliant with meds. Denies any noticeable swelling. Patient 90% on room air with increased WOB. Patient states he wears oxygen at night but should probably wear it continuously.

## 2019-12-28 NOTE — ED Notes (Signed)
Patient was ambulated on 2lnc and pulse ox was showing 95% until we returned back from short walk, patient desated to 89% went back to bed and was winded. Md aware. Awaiting further plan of care.

## 2020-01-09 DIAGNOSIS — I10 Essential (primary) hypertension: Secondary | ICD-10-CM | POA: Diagnosis not present

## 2020-01-09 DIAGNOSIS — E1165 Type 2 diabetes mellitus with hyperglycemia: Secondary | ICD-10-CM | POA: Diagnosis not present

## 2020-01-09 DIAGNOSIS — Z794 Long term (current) use of insulin: Secondary | ICD-10-CM | POA: Diagnosis not present

## 2020-01-09 DIAGNOSIS — E1159 Type 2 diabetes mellitus with other circulatory complications: Secondary | ICD-10-CM | POA: Diagnosis not present

## 2020-01-09 DIAGNOSIS — E782 Mixed hyperlipidemia: Secondary | ICD-10-CM | POA: Diagnosis not present

## 2020-01-09 DIAGNOSIS — N1831 Chronic kidney disease, stage 3a: Secondary | ICD-10-CM | POA: Diagnosis not present

## 2020-01-11 ENCOUNTER — Other Ambulatory Visit: Payer: Self-pay

## 2020-01-11 NOTE — Patient Outreach (Signed)
[facility]) Care Management  [place]  01/11/2020   Michael Wright June 10, 1941 [id number]  Subjective: Telephone call to patient for disease management follow up. Patient reports he is not doing his best. He states he has been feeling bad most of the time.  Patient does not have a specific complaint.  He states he has followed up with physicians and nothing was found. He states that he was to take 2 lasix a day instead of one but he states he felt like that was too much and is back down to one a day.  He states that his weight is at 222 lbs at this time. Discussed with patient chronic illnesses and how they all play a part in overall well being.  He verbalized understanding. Encouraged patient to continue to follow up with doctors as scheduled and take medications as prescribed.  Also discussed his COPD and maintenance of COPD.  He verbalized understanding and voices no needs but appreciative of call and explanation.  Objective:   Encounter Medications:  Outpatient Encounter Medications as of 01/11/2020  Medication Sig  . allopurinol (ZYLOPRIM) 100 MG tablet Take 100 mg by mouth 2 (two) times daily.   Marland Kitchen aspirin EC 81 MG tablet Take 81 mg by mouth daily.  Marland Kitchen bismuth subsalicylate (PEPTO BISMOL) 262 MG/15ML suspension Take 30 mLs by mouth every 6 (six) hours as needed.  . carvedilol (COREG) 3.125 MG tablet Take 3.125 mg by mouth 2 (two) times daily with a meal.  . colchicine 0.6 MG tablet Take 0.6 mg by mouth 2 (two) times daily as needed (for gout flares).  . Cyanocobalamin 3000 MCG SUBL Place 3,000 mcg under the tongue daily at 12 noon.  . donepezil (ARICEPT) 10 MG tablet Take 10 mg by mouth daily.   . finasteride (PROSCAR) 5 MG tablet Take 1 tablet (5 mg total) by mouth daily.  . furosemide (LASIX) 20 MG tablet Take 20 mg by mouth daily.  . insulin glargine (LANTUS) 100 unit/mL SOPN Inject 0.36 mLs (36 Units total) into the skin at bedtime. (Patient taking  differently: Inject 42 Units into the skin at bedtime. )  . ipratropium-albuterol (DUONEB) 0.5-2.5 (3) MG/3ML SOLN Take 3 mLs by nebulization every 6 (six) hours as needed (For wheezing and shortness of breath).  . loratadine (CLARITIN) 10 MG tablet Take 10 mg by mouth daily.  Marland Kitchen losartan (COZAAR) 25 MG tablet Take 25 mg by mouth daily.  . metFORMIN (GLUCOPHAGE-XR) 500 MG 24 hr tablet Take 500 mg by mouth 2 (two) times daily.  . OXYGEN Inhale into the lungs at bedtime as needed.  . pantoprazole (PROTONIX) 40 MG tablet Take 1 tablet (40 mg total) by mouth daily. (Patient taking differently: Take 40 mg by mouth 2 (two) times daily. )  . potassium chloride 20 MEQ TBCR Take 20 mEq by mouth daily. While on lasix  . pravastatin (PRAVACHOL) 40 MG tablet Take 40 mg by mouth at bedtime.   . traZODone (DESYREL) 50 MG tablet Take 1 tablet (50 mg total) by mouth at bedtime.  . Turmeric 500 MG CAPS Take 1 Dose by mouth daily.   No facility-administered encounter medications on file as of 01/11/2020.    Functional Status:  In your present state of health, do you have any difficulty performing the following activities: 01/11/2020 07/18/2019  Hearing? N N  Vision? N N  Difficulty concentrating or making decisions? N N  Walking or climbing stairs? Y Y  Comment leg weakness  leg weakness  Dressing or bathing? N N  Doing errands, shopping? Tempie Donning  Comment daughter assists daughter assists  Conservation officer, nature and eating ? N N  Using the Toilet? N N  In the past six months, have you accidently leaked urine? N N  Do you have problems with loss of bowel control? N N  Managing your Medications? Tempie Donning  Comment family assists family assists  Managing your Finances? Tempie Donning  Comment family assists -  Housekeeping or managing your Housekeeping? Tempie Donning  Comment family assists -  Some recent data might be hidden    Fall/Depression Screening: Fall Risk  01/11/2020 07/18/2019 04/25/2019  Falls in the past year? 1 1 0  Comment -  - -  Number falls in past yr: 1 1 0  Injury with Fall? 0 0 0  Comment - - -  Risk for fall due to : History of fall(s) History of fall(s) Impaired balance/gait;Impaired mobility  Risk for fall due to: Comment - - -  Follow up Falls prevention discussed Falls prevention discussed Falls evaluation completed;Education provided   [facility] 2/9 Scores 01/11/2020 07/18/2019 04/25/2019 01/25/2019 11/22/2018 08/02/2018  PHQ - 2 Score 1 1 2 2 2 2   PHQ- 9 Score - - - 6 6 6     Assessment: Patient managing chronic illnesses, patient follows up with physicians as scheduled.  Patient continues to benefit from disease management follow up and support.   Plan:  [facility] CM Care Plan Problem Two     Most Recent Value  Care Plan Problem Two  Ongoing self-health management of chronic disease state of COPD, as evidenced by patient reporting during RN CM outreach  Role Documenting the Problem Two  Care Management Telephonic [place] for Problem Two  Active  Interventions for Problem Two Long Term Goal   Patient continues to take medications as prescribed and follow up with physicians as scheduled.  Patient reports being in green zone today.  Patient able to describe yellow and red zone feelings.  Reiterated with patient actions for COPD zones.    [facility] Long Term Goal  Over the next 90 days, patient will be able to verbalize signs/ symptoms and actions for COPD exacerbation, as evidenced by patient reporting to RN CM at outreach  [place] Term Goal Start Date  01/11/20     RN CM will contact patient again in the month of August and patient agreeable.    Jone Baseman, RN, MSN [place] Management Care Management Coordinator Direct Line [phone] Cell [phone] Toll Free: [phone]  Fax: [phone]

## 2020-01-25 DIAGNOSIS — J45909 Unspecified asthma, uncomplicated: Secondary | ICD-10-CM | POA: Diagnosis not present

## 2020-01-25 DIAGNOSIS — J440 Chronic obstructive pulmonary disease with acute lower respiratory infection: Secondary | ICD-10-CM | POA: Diagnosis not present

## 2020-01-25 DIAGNOSIS — R0689 Other abnormalities of breathing: Secondary | ICD-10-CM | POA: Diagnosis not present

## 2020-01-25 DIAGNOSIS — J449 Chronic obstructive pulmonary disease, unspecified: Secondary | ICD-10-CM | POA: Diagnosis not present

## 2020-02-16 ENCOUNTER — Inpatient Hospital Stay: Payer: Medicare HMO | Attending: Oncology

## 2020-02-16 ENCOUNTER — Other Ambulatory Visit: Payer: Self-pay

## 2020-02-16 DIAGNOSIS — D72829 Elevated white blood cell count, unspecified: Secondary | ICD-10-CM | POA: Insufficient documentation

## 2020-02-16 LAB — CBC WITH DIFFERENTIAL/PLATELET
Abs Immature Granulocytes: 0.06 10*3/uL (ref 0.00–0.07)
Basophils Absolute: 0.1 10*3/uL (ref 0.0–0.1)
Basophils Relative: 1 %
Eosinophils Absolute: 0.7 10*3/uL — ABNORMAL HIGH (ref 0.0–0.5)
Eosinophils Relative: 6 %
HCT: 41.8 % (ref 39.0–52.0)
Hemoglobin: 13.2 g/dL (ref 13.0–17.0)
Immature Granulocytes: 1 %
Lymphocytes Relative: 17 %
Lymphs Abs: 2.2 10*3/uL (ref 0.7–4.0)
MCH: 30 pg (ref 26.0–34.0)
MCHC: 31.6 g/dL (ref 30.0–36.0)
MCV: 95 fL (ref 80.0–100.0)
Monocytes Absolute: 1 10*3/uL (ref 0.1–1.0)
Monocytes Relative: 8 %
Neutro Abs: 8.8 10*3/uL — ABNORMAL HIGH (ref 1.7–7.7)
Neutrophils Relative %: 67 %
Platelets: 227 10*3/uL (ref 150–400)
RBC: 4.4 MIL/uL (ref 4.22–5.81)
RDW: 12.7 % (ref 11.5–15.5)
WBC: 12.9 10*3/uL — ABNORMAL HIGH (ref 4.0–10.5)
nRBC: 0 % (ref 0.0–0.2)

## 2020-02-19 DIAGNOSIS — E782 Mixed hyperlipidemia: Secondary | ICD-10-CM | POA: Diagnosis not present

## 2020-02-19 DIAGNOSIS — R0602 Shortness of breath: Secondary | ICD-10-CM | POA: Diagnosis not present

## 2020-02-19 DIAGNOSIS — R0789 Other chest pain: Secondary | ICD-10-CM | POA: Diagnosis not present

## 2020-02-19 DIAGNOSIS — I5032 Chronic diastolic (congestive) heart failure: Secondary | ICD-10-CM | POA: Diagnosis not present

## 2020-02-19 DIAGNOSIS — I2510 Atherosclerotic heart disease of native coronary artery without angina pectoris: Secondary | ICD-10-CM | POA: Diagnosis not present

## 2020-02-19 DIAGNOSIS — I700 Atherosclerosis of aorta: Secondary | ICD-10-CM | POA: Diagnosis not present

## 2020-02-19 DIAGNOSIS — I10 Essential (primary) hypertension: Secondary | ICD-10-CM | POA: Diagnosis not present

## 2020-02-25 DIAGNOSIS — J440 Chronic obstructive pulmonary disease with acute lower respiratory infection: Secondary | ICD-10-CM | POA: Diagnosis not present

## 2020-02-25 DIAGNOSIS — R0689 Other abnormalities of breathing: Secondary | ICD-10-CM | POA: Diagnosis not present

## 2020-02-25 DIAGNOSIS — J45909 Unspecified asthma, uncomplicated: Secondary | ICD-10-CM | POA: Diagnosis not present

## 2020-02-25 DIAGNOSIS — J449 Chronic obstructive pulmonary disease, unspecified: Secondary | ICD-10-CM | POA: Diagnosis not present

## 2020-03-26 DIAGNOSIS — J45909 Unspecified asthma, uncomplicated: Secondary | ICD-10-CM | POA: Diagnosis not present

## 2020-03-26 DIAGNOSIS — J440 Chronic obstructive pulmonary disease with acute lower respiratory infection: Secondary | ICD-10-CM | POA: Diagnosis not present

## 2020-03-26 DIAGNOSIS — R0689 Other abnormalities of breathing: Secondary | ICD-10-CM | POA: Diagnosis not present

## 2020-03-26 DIAGNOSIS — J449 Chronic obstructive pulmonary disease, unspecified: Secondary | ICD-10-CM | POA: Diagnosis not present

## 2020-04-08 DIAGNOSIS — R0602 Shortness of breath: Secondary | ICD-10-CM | POA: Diagnosis not present

## 2020-04-11 ENCOUNTER — Other Ambulatory Visit: Payer: Self-pay

## 2020-04-11 NOTE — Patient Outreach (Signed)
[facility] [facility]) Care Management  [place]  04/11/2020   Michael Wright November 08, 1940 [id number]  Subjective: Telephone call to patient for disease management follow up.  Patient reports he is feeling some better today than he has in a long time.  He reports he has not worn his oxygen as he feels some better.  Patient does reports having s echocardiogram done for ongoing shortness of breath and waiting for results. He states his sugars are good unless he eats late.  Discussed COPD and maintenance.  He verbalized understanding.    Objective:   Encounter Medications:  Outpatient Encounter Medications as of 04/11/2020  Medication Sig  . allopurinol (ZYLOPRIM) 100 MG tablet Take 100 mg by mouth 2 (two) times daily.   Marland Kitchen aspirin EC 81 MG tablet Take 81 mg by mouth daily.  Marland Kitchen bismuth subsalicylate (PEPTO BISMOL) 262 MG/15ML suspension Take 30 mLs by mouth every 6 (six) hours as needed.  . carvedilol (COREG) 3.125 MG tablet Take 3.125 mg by mouth 2 (two) times daily with a meal.  . colchicine 0.6 MG tablet Take 0.6 mg by mouth 2 (two) times daily as needed (for gout flares).  . Cyanocobalamin 3000 MCG SUBL Place 3,000 mcg under the tongue daily at 12 noon.  . donepezil (ARICEPT) 10 MG tablet Take 10 mg by mouth daily.   . finasteride (PROSCAR) 5 MG tablet Take 1 tablet (5 mg total) by mouth daily.  . furosemide (LASIX) 20 MG tablet Take 20 mg by mouth daily.  . insulin glargine (LANTUS) 100 unit/mL SOPN Inject 0.36 mLs (36 Units total) into the skin at bedtime. (Patient taking differently: Inject 42 Units into the skin at bedtime. )  . ipratropium-albuterol (DUONEB) 0.5-2.5 (3) MG/3ML SOLN Take 3 mLs by nebulization every 6 (six) hours as needed (For wheezing and shortness of breath).  . loratadine (CLARITIN) 10 MG tablet Take 10 mg by mouth daily.  Marland Kitchen losartan (COZAAR) 25 MG tablet Take 25 mg by mouth daily.  . metFORMIN (GLUCOPHAGE-XR) 500 MG 24 hr tablet Take 500 mg by  mouth 2 (two) times daily.  . OXYGEN Inhale into the lungs at bedtime as needed.  . pantoprazole (PROTONIX) 40 MG tablet Take 1 tablet (40 mg total) by mouth daily. (Patient taking differently: Take 40 mg by mouth 2 (two) times daily. )  . potassium chloride 20 MEQ TBCR Take 20 mEq by mouth daily. While on lasix  . pravastatin (PRAVACHOL) 40 MG tablet Take 40 mg by mouth at bedtime.   . traZODone (DESYREL) 50 MG tablet Take 1 tablet (50 mg total) by mouth at bedtime.  . Turmeric 500 MG CAPS Take 1 Dose by mouth daily.   No facility-administered encounter medications on file as of 04/11/2020.    Functional Status:  In your present state of health, do you have any difficulty performing the following activities: 01/11/2020 07/18/2019  Hearing? N N  Vision? N N  Difficulty concentrating or making decisions? N N  Walking or climbing stairs? Y Y  Comment leg weakness leg weakness  Dressing or bathing? N N  Doing errands, shopping? Tempie Donning  Comment daughter assists daughter assists  Conservation officer, nature and eating ? N N  Using the Toilet? N N  In the past six months, have you accidently leaked urine? N N  Do you have problems with loss of bowel control? N N  Managing your Medications? Tempie Donning  Comment family assists family assists  Managing your Finances? Darreld Mclean  Y  Comment family assists -  Housekeeping or managing your Housekeeping? Tempie Donning  Comment family assists -  Some recent data might be hidden    Fall/Depression Screening: Fall Risk  04/11/2020 01/11/2020 07/18/2019  Falls in the past year? 1 1 1   Comment - - -  Number falls in past yr: 1 1 1   Injury with Fall? 0 0 0  Comment - - -  Risk for fall due to : History of fall(s) History of fall(s) History of fall(s)  Risk for fall due to: Comment - - -  Follow up Falls prevention discussed Falls prevention discussed Falls prevention discussed   PHQ 2/9 Scores 04/11/2020 01/11/2020 07/18/2019 04/25/2019 01/25/2019 11/22/2018 08/02/2018  PHQ - 2 Score 1 1 1 2  2 2 2   PHQ- 9 Score - - - - 6 6 6     Assessment: Patient continues to manage chronic conditions.  No recent hospitalizations.    Plan:  [facility] CM Care Plan Problem Two     Most Recent Value  Care Plan Problem Two Ongoing self-health management of chronic disease state of COPD, as evidenced by patient reporting during RN CM outreach  Role Documenting the Problem Two Care Management Telephonic [place] for Problem Two Active  Interventions for Problem Two Long Term Goal  Patient continues to have some shortness of breath. Echocardiogram completed recently to help explain some of the shortness of breath.  REiterated with patient signs of worsening COPD and when to notify physician.   [facility] Long Term Goal Over the next 90 days, patient will be able to verbalize signs/ symptoms and actions for COPD exacerbation, as evidenced by patient reporting to RN CM at outreach  [place] Term Goal Start Date 04/11/20     RN CM will contact in the month of November and patient agreeable.   Jone Baseman, RN, MSN [place] Management Care Management Coordinator Direct Line [phone] Cell [phone] Toll Free: [phone]  Fax: [phone]

## 2020-04-12 DIAGNOSIS — N1831 Chronic kidney disease, stage 3a: Secondary | ICD-10-CM | POA: Diagnosis not present

## 2020-04-12 DIAGNOSIS — E1165 Type 2 diabetes mellitus with hyperglycemia: Secondary | ICD-10-CM | POA: Diagnosis not present

## 2020-04-12 DIAGNOSIS — E669 Obesity, unspecified: Secondary | ICD-10-CM | POA: Diagnosis not present

## 2020-04-12 DIAGNOSIS — E782 Mixed hyperlipidemia: Secondary | ICD-10-CM | POA: Diagnosis not present

## 2020-04-12 DIAGNOSIS — E1159 Type 2 diabetes mellitus with other circulatory complications: Secondary | ICD-10-CM | POA: Diagnosis not present

## 2020-04-12 DIAGNOSIS — I152 Hypertension secondary to endocrine disorders: Secondary | ICD-10-CM | POA: Diagnosis not present

## 2020-04-12 DIAGNOSIS — Z794 Long term (current) use of insulin: Secondary | ICD-10-CM | POA: Diagnosis not present

## 2020-04-15 DIAGNOSIS — I5032 Chronic diastolic (congestive) heart failure: Secondary | ICD-10-CM | POA: Diagnosis not present

## 2020-04-15 DIAGNOSIS — Z01818 Encounter for other preprocedural examination: Secondary | ICD-10-CM | POA: Diagnosis not present

## 2020-04-15 DIAGNOSIS — I10 Essential (primary) hypertension: Secondary | ICD-10-CM | POA: Diagnosis not present

## 2020-04-15 DIAGNOSIS — I700 Atherosclerosis of aorta: Secondary | ICD-10-CM | POA: Diagnosis not present

## 2020-04-15 DIAGNOSIS — I2510 Atherosclerotic heart disease of native coronary artery without angina pectoris: Secondary | ICD-10-CM | POA: Diagnosis not present

## 2020-04-15 DIAGNOSIS — E782 Mixed hyperlipidemia: Secondary | ICD-10-CM | POA: Diagnosis not present

## 2020-04-15 DIAGNOSIS — I5033 Acute on chronic diastolic (congestive) heart failure: Secondary | ICD-10-CM | POA: Diagnosis not present

## 2020-04-16 DIAGNOSIS — I5041 Acute combined systolic (congestive) and diastolic (congestive) heart failure: Secondary | ICD-10-CM | POA: Diagnosis present

## 2020-04-18 DIAGNOSIS — I5032 Chronic diastolic (congestive) heart failure: Secondary | ICD-10-CM | POA: Diagnosis not present

## 2020-04-18 DIAGNOSIS — N1831 Chronic kidney disease, stage 3a: Secondary | ICD-10-CM | POA: Diagnosis not present

## 2020-04-18 DIAGNOSIS — E1122 Type 2 diabetes mellitus with diabetic chronic kidney disease: Secondary | ICD-10-CM | POA: Diagnosis not present

## 2020-04-18 DIAGNOSIS — I10 Essential (primary) hypertension: Secondary | ICD-10-CM | POA: Diagnosis not present

## 2020-04-22 ENCOUNTER — Other Ambulatory Visit
Admission: RE | Admit: 2020-04-22 | Discharge: 2020-04-22 | Disposition: A | Discharge status: Home / Self Care | Payer: Medicare HMO | Source: Ambulatory Visit | Attending: Internal Medicine | Admitting: Internal Medicine

## 2020-04-22 ENCOUNTER — Other Ambulatory Visit: Payer: Self-pay

## 2020-04-22 DIAGNOSIS — Z20822 Contact with and (suspected) exposure to covid-19: Secondary | ICD-10-CM | POA: Insufficient documentation

## 2020-04-22 DIAGNOSIS — Z01812 Encounter for preprocedural laboratory examination: Secondary | ICD-10-CM | POA: Diagnosis not present

## 2020-04-23 LAB — SARS CORONAVIRUS 2 (TAT 6-24 HRS): SARS Coronavirus 2: NEGATIVE

## 2020-04-24 ENCOUNTER — Other Ambulatory Visit: Payer: Self-pay

## 2020-04-24 ENCOUNTER — Encounter: Payer: Self-pay | Admitting: Internal Medicine

## 2020-04-24 ENCOUNTER — Encounter
Admission: RE | Disposition: A | Discharge status: Home / Self Care | Payer: Self-pay | Source: Home / Self Care | Attending: Internal Medicine

## 2020-04-24 ENCOUNTER — Ambulatory Visit
Admission: RE | Admit: 2020-04-24 | Discharge: 2020-04-24 | Disposition: A | Discharge status: Home / Self Care | Payer: Medicare HMO | Attending: Internal Medicine | Admitting: Internal Medicine

## 2020-04-24 DIAGNOSIS — Z79899 Other long term (current) drug therapy: Secondary | ICD-10-CM | POA: Insufficient documentation

## 2020-04-24 DIAGNOSIS — R079 Chest pain, unspecified: Secondary | ICD-10-CM | POA: Insufficient documentation

## 2020-04-24 DIAGNOSIS — Z7982 Long term (current) use of aspirin: Secondary | ICD-10-CM | POA: Diagnosis not present

## 2020-04-24 DIAGNOSIS — Z794 Long term (current) use of insulin: Secondary | ICD-10-CM | POA: Insufficient documentation

## 2020-04-24 DIAGNOSIS — R0602 Shortness of breath: Secondary | ICD-10-CM | POA: Insufficient documentation

## 2020-04-24 DIAGNOSIS — I5089 Other heart failure: Secondary | ICD-10-CM

## 2020-04-24 DIAGNOSIS — D649 Anemia, unspecified: Secondary | ICD-10-CM | POA: Diagnosis not present

## 2020-04-24 DIAGNOSIS — I110 Hypertensive heart disease with heart failure: Secondary | ICD-10-CM | POA: Insufficient documentation

## 2020-04-24 DIAGNOSIS — I700 Atherosclerosis of aorta: Secondary | ICD-10-CM | POA: Insufficient documentation

## 2020-04-24 DIAGNOSIS — I272 Pulmonary hypertension, unspecified: Secondary | ICD-10-CM | POA: Insufficient documentation

## 2020-04-24 DIAGNOSIS — I2582 Chronic total occlusion of coronary artery: Secondary | ICD-10-CM | POA: Insufficient documentation

## 2020-04-24 DIAGNOSIS — I2510 Atherosclerotic heart disease of native coronary artery without angina pectoris: Secondary | ICD-10-CM | POA: Diagnosis not present

## 2020-04-24 DIAGNOSIS — I5032 Chronic diastolic (congestive) heart failure: Secondary | ICD-10-CM | POA: Diagnosis not present

## 2020-04-24 DIAGNOSIS — I25110 Atherosclerotic heart disease of native coronary artery with unstable angina pectoris: Secondary | ICD-10-CM | POA: Diagnosis not present

## 2020-04-24 DIAGNOSIS — E119 Type 2 diabetes mellitus without complications: Secondary | ICD-10-CM | POA: Insufficient documentation

## 2020-04-24 DIAGNOSIS — I5041 Acute combined systolic (congestive) and diastolic (congestive) heart failure: Secondary | ICD-10-CM | POA: Diagnosis present

## 2020-04-24 DIAGNOSIS — E782 Mixed hyperlipidemia: Secondary | ICD-10-CM | POA: Insufficient documentation

## 2020-04-24 HISTORY — PX: RIGHT/LEFT HEART CATH AND CORONARY ANGIOGRAPHY: CATH118266

## 2020-04-24 LAB — GLUCOSE, CAPILLARY: Glucose-Capillary: 122 mg/dL — ABNORMAL HIGH (ref 70–99)

## 2020-04-24 SURGERY — RIGHT/LEFT HEART CATH AND CORONARY ANGIOGRAPHY
Anesthesia: Moderate Sedation

## 2020-04-24 MED ORDER — SODIUM CHLORIDE 0.9% FLUSH: 3.0000 mL | Freq: Two times a day (BID) | INTRAVENOUS | Status: DC

## 2020-04-24 MED ORDER — LIDOCAINE HCL (PF) 1 % IJ SOLN
INTRAMUSCULAR | Status: AC
  Filled 2020-04-24: qty 30

## 2020-04-24 MED ORDER — HYDRALAZINE HCL 20 MG/ML IJ SOLN: 10.0000 mg | INTRAMUSCULAR | Status: DC | PRN

## 2020-04-24 MED ORDER — MIDAZOLAM HCL 2 MG/2ML IJ SOLN
INTRAMUSCULAR | Status: DC | PRN
  Administered 2020-04-24: 1 mg via INTRAVENOUS

## 2020-04-24 MED ORDER — MIDAZOLAM HCL 2 MG/2ML IJ SOLN
INTRAMUSCULAR | Status: AC
  Filled 2020-04-24: qty 2

## 2020-04-24 MED ORDER — IOHEXOL 300 MG/ML  SOLN
INTRAMUSCULAR | Status: DC | PRN
  Administered 2020-04-24: 110 mL

## 2020-04-24 MED ORDER — LIDOCAINE HCL (PF) 1 % IJ SOLN
INTRAMUSCULAR | Status: DC | PRN
  Administered 2020-04-24: 10 mL via SUBCUTANEOUS

## 2020-04-24 MED ORDER — ONDANSETRON HCL 4 MG/2ML IJ SOLN: 4.0000 mg | Freq: Four times a day (QID) | INTRAMUSCULAR | Status: DC | PRN

## 2020-04-24 MED ORDER — HEPARIN (PORCINE) IN NACL 1000-0.9 UT/500ML-% IV SOLN
INTRAVENOUS | Status: AC
  Filled 2020-04-24: qty 1000

## 2020-04-24 MED ORDER — SODIUM CHLORIDE 0.9 % WEIGHT BASED INFUSION
300.0000 mL/h | INTRAVENOUS | Status: AC
  Administered 2020-04-24: 3 mL/kg/h via INTRAVENOUS

## 2020-04-24 MED ORDER — ACETAMINOPHEN 325 MG PO TABS: 650.0000 mg | ORAL_TABLET | ORAL | Status: DC | PRN

## 2020-04-24 MED ORDER — SODIUM CHLORIDE 0.9 % WEIGHT BASED INFUSION: 1.0000 mL/kg/h | INTRAVENOUS | Status: DC

## 2020-04-24 MED ORDER — SODIUM CHLORIDE 0.9 % IV SOLN: 250.0000 mL | INTRAVENOUS | Status: DC | PRN

## 2020-04-24 MED ORDER — FENTANYL CITRATE (PF) 100 MCG/2ML IJ SOLN
INTRAMUSCULAR | Status: DC | PRN
  Administered 2020-04-24: 25 ug via INTRAVENOUS

## 2020-04-24 MED ORDER — LABETALOL HCL 5 MG/ML IV SOLN: 10.0000 mg | INTRAVENOUS | Status: DC | PRN

## 2020-04-24 MED ORDER — FENTANYL CITRATE (PF) 100 MCG/2ML IJ SOLN
INTRAMUSCULAR | Status: AC
  Filled 2020-04-24: qty 2

## 2020-04-24 MED ORDER — ASPIRIN 81 MG PO CHEW: 81.0000 mg | CHEWABLE_TABLET | ORAL | Status: DC

## 2020-04-24 MED ORDER — SODIUM CHLORIDE 0.9% FLUSH: 3.0000 mL | INTRAVENOUS | Status: DC | PRN

## 2020-04-24 SURGICAL SUPPLY — 13 items
CATH INFINITI 5FR ANG PIGTAIL (CATHETERS) ×3 IMPLANT
CATH INFINITI 5FR JL4 (CATHETERS) ×3 IMPLANT
CATH INFINITI JR4 5F (CATHETERS) ×3 IMPLANT
CATH SWANZ 7F THERMO (CATHETERS) ×3 IMPLANT
DEVICE CLOSURE MYNXGRIP 5F (Vascular Products) ×3 IMPLANT
GUIDEWIRE EMER 3M J .025X150CM (WIRE) ×3 IMPLANT
KIT MANI 3VAL PERCEP (MISCELLANEOUS) ×3 IMPLANT
KIT RIGHT HEART (MISCELLANEOUS) ×3 IMPLANT
NEEDLE PERC 18GX7CM (NEEDLE) ×3 IMPLANT
PACK CARDIAC CATH (CUSTOM PROCEDURE TRAY) ×3 IMPLANT
SHEATH AVANTI 5FR X 11CM (SHEATH) ×3 IMPLANT
SHEATH AVANTI 7FRX11 (SHEATH) ×3 IMPLANT
WIRE GUIDERIGHT .035X150 (WIRE) ×3 IMPLANT

## 2020-04-24 NOTE — Discharge Instructions (Signed)
Femoral Site Care °This sheet gives you information about how to care for yourself after your procedure. Your health care provider may also give you more specific instructions. If you have problems or questions, contact your health care provider. °What can I expect after the procedure? °After the procedure, it is common to have: °· Bruising that usually fades within 1-2 weeks. °· Tenderness at the site. °Follow these instructions at home: °Wound care °· Follow instructions from your health care provider about how to take care of your insertion site. Make sure you: °? Wash your hands with soap and water before you change your bandage (dressing). If soap and water are not available, use hand sanitizer. °? Change your dressing as told by your health care provider. °? Leave stitches (sutures), skin glue, or adhesive strips in place. These skin closures may need to stay in place for 2 weeks or longer. If adhesive strip edges start to loosen and curl up, you may trim the loose edges. Do not remove adhesive strips completely unless your health care provider tells you to do that. °· Do not take baths, swim, or use a hot tub until your health care provider approves. °· You may shower 24-48 hours after the procedure or as told by your health care provider. °? Gently wash the site with plain soap and water. °? Pat the area dry with a clean towel. °? Do not rub the site. This may cause bleeding. °· Do not apply powder or lotion to the site. Keep the site clean and dry. °· Check your femoral site every day for signs of infection. Check for: °? Redness, swelling, or pain. °? Fluid or blood. °? Warmth. °? Pus or a bad smell. °Activity °· For the first 2-3 days after your procedure, or as long as directed: °? Avoid climbing stairs as much as possible. °? Do not squat. °· Do not lift anything that is heavier than 10 lb (4.5 kg), or the limit that you are told, until your health care provider says that it is safe. °· Rest as  directed. °? Avoid sitting for a long time without moving. Get up to take short walks every 1-2 hours. °· Do not drive for 24 hours if you were given a medicine to help you relax (sedative). °General instructions °· Take over-the-counter and prescription medicines only as told by your health care provider. °· Keep all follow-up visits as told by your health care provider. This is important. °Contact a health care provider if you have: °· A fever or chills. °· You have redness, swelling, or pain around your insertion site. °Get help right away if: °· The catheter insertion area swells very fast. °· You pass out. °· You suddenly start to sweat or your skin gets clammy. °· The catheter insertion area is bleeding, and the bleeding does not stop when you hold steady pressure on the area. °· The area near or just beyond the catheter insertion site becomes pale, cool, tingly, or numb. °These symptoms may represent a serious problem that is an emergency. Do not wait to see if the symptoms will go away. Get medical help right away. Call your local emergency services (911 in the [place] Do not drive yourself to the hospital. °Summary °· After the procedure, it is common to have bruising that usually fades within 1-2 weeks. °· Check your femoral site every day for signs of infection. °· Do not lift anything that is heavier than 10 lb (4.5 kg), or the   limit that you are told, until your health care provider says that it is safe. °This information is not intended to replace advice given to you by your health care provider. Make sure you discuss any questions you have with your health care provider. °Document Revised: 08/30/2017 Document Reviewed: 08/30/2017 °Elsevier Patient Education © 2020 [employer] °Moderate Conscious Sedation, Adult, Care After °These instructions provide you with information about caring for yourself after your procedure. Your health care provider may also give you more specific instructions. Your  treatment has been planned according to current medical practices, but problems sometimes occur. Call your health care provider if you have any problems or questions after your procedure. °What can I expect after the procedure? °After your procedure, it is common: °· To feel sleepy for several hours. °· To feel clumsy and have poor balance for several hours. °· To have poor judgment for several hours. °· To vomit if you eat too soon. °Follow these instructions at home: °For at least 24 hours after the procedure: ° °· Do not: °? Participate in activities where you could fall or become injured. °? Drive. °? Use heavy machinery. °? Drink alcohol. °? Take sleeping pills or medicines that cause drowsiness. °? Make important decisions or sign legal documents. °? Take care of children on your own. °· Rest. °Eating and drinking °· Follow the diet recommended by your health care provider. °· If you vomit: °? Drink water, juice, or soup when you can drink without vomiting. °? Make sure you have little or no nausea before eating solid foods. °General instructions °· Have a responsible adult stay with you until you are awake and alert. °· Take over-the-counter and prescription medicines only as told by your health care provider. °· If you smoke, do not smoke without supervision. °· Keep all follow-up visits as told by your health care provider. This is important. °Contact a health care provider if: °· You keep feeling nauseous or you keep vomiting. °· You feel light-headed. °· You develop a rash. °· You have a fever. °Get help right away if: °· You have trouble breathing. °This information is not intended to replace advice given to you by your health care provider. Make sure you discuss any questions you have with your health care provider. °Document Revised: 07/30/2017 Document Reviewed: 12/07/2015 °Elsevier Patient Education © 2020 [employer] °Coronary Angiogram °A coronary angiogram is an X-ray procedure that is used to  examine the arteries in the heart. Contrast dye is injected through a long, thin tube (catheter) into these arteries. Then X-rays are taken to show any blockage in these arteries. °You may have this procedure if you: °· Are having chest pain, or other symptoms of angina, and you are at risk for heart disease. °· Have an abnormal stress test or test of your heart's electrical activity (electrocardiogram, or ECG). °· Have chest pain and heart failure. °· Are having irregular heart rhythms. °A coronary angiogram or heart catheterization can show if you have valve disease or a disease of the aorta. This procedure can also be used to check the overall function of your heart muscle. °Let your health care provider know about: °· Any allergies you have, including allergies to medicines or contrast dye. °· All medicines you are taking, including vitamins, herbs, eye drops, creams, and over-the-counter medicines. °· Any problems you or family members have had with anesthetic medicines. °· Any blood disorders you have. °· Any surgeries you have had. °· Any history of kidney   problems or kidney failure. °· Any medical conditions you have. °· Whether you are pregnant or may be pregnant. °· Whether you are breastfeeding. °What are the risks? °Generally, this is a safe procedure. However, problems may occur, including: °· Infection. °· Allergic reaction to medicines or dyes that are used. °· Bleeding from the insertion site or other places. °· Damage to nearby structures, such as blood vessels, or damage to kidneys from contrast dye. °· Irregular heart rhythms. °· Stroke (rare). °· Heart attack (rare). °What happens before the procedure? °Staying hydrated °Follow instructions from your health care provider about hydration, which may include: °· Up to 2 hours before the procedure - you may continue to drink clear liquids, such as water, clear fruit juice, black coffee, and plain tea. ° °Eating and drinking restrictions °Follow  instructions from your health care provider about eating and drinking, which may include: °· 8 hours before the procedure - stop eating heavy meals or foods, such as meat, fried foods, or fatty foods. °· 6 hours before the procedure - stop eating light meals or foods, such as toast or cereal. °· 6 hours before the procedure - stop drinking milk or drinks that contain milk. °· 2 hours before the procedure - stop drinking clear liquids. °Medicines °Ask your health care provider about: °· Changing or stopping your regular medicines. This is especially important if you are taking diabetes medicines or blood thinners. °· Taking medicines such as aspirin and ibuprofen. These medicines can thin your blood. Do not take these medicines unless your health care provider tells you to take them. Aspirin may be recommended before coronary angiograms even if you do not normally take it. °· Taking over-the-counter medicines, vitamins, herbs, and supplements. °General instructions °· Do not use any products that contain nicotine or tobacco for at least 4 weeks before the procedure. These products include cigarettes, e-cigarettes, and chewing tobacco. If you need help quitting, ask your health care provider. °· You may have an exam or testing. °· Plan to have someone take you home from the hospital or clinic. °· If you will be going home right after the procedure, plan to have someone with you for 24 hours. °· Ask your health care provider: °? How your insertion site will be marked. °? What steps will be taken to help prevent infection. These may include: °§ Removing hair at the insertion site. °§ Washing skin with a germ-killing soap. °§ Taking antibiotic medicine. °What happens during the procedure? ° °· You will lie on your back on an X-ray table. °· An IV will be inserted into one of your veins. °· Electrodes will be placed on your chest. °· You will be given one or more of the following: °? A medicine to help you relax  (sedative). °? A medicine to numb the catheter insertion area (local anesthetic). °· You will be connected to a continuous ECG monitor. °· The catheter will be inserted into an artery in one of these areas: °? Your groin area in your upper thigh. °? Your wrist. °? The fold of your arm, near your elbow. °· An X-ray procedure (fluoroscopy) will be used to help guide the catheter to the opening of the blood vessel to be used. °· A dye will be injected into the catheter and X-rays will be taken. The dye will help to show any narrowing or blockages in the heart arteries. °· Tell your health care provider if you have chest pain or trouble breathing. °· If   blockages are found, another procedure may be done to open the artery. °· The catheter will be removed after the fluoroscopy is complete. °· A bandage (dressing) will be placed over the insertion site. Pressure will be applied to stop bleeding. °· The IV will be removed. °The procedure may vary among health care providers and hospitals. °What happens after the procedure? °· Your blood pressure, heart rate, breathing rate, and blood oxygen level will be monitored until you leave the hospital or clinic. °· You will need to lie still for a few hours, or for as long as told by your health care provider. °? If the procedure is done through the groin, you will be told not to bend or cross your legs. °· The insertion site and the pulse in your foot or wrist will be checked often. °· More blood tests, X-rays, and an ECG may be done. °· Do not drive for 24 hours if you were given a sedative during your procedure. °Summary °· A coronary angiogram is an X-ray procedure that is used to examine the arteries in the heart. °· Contrast dye is injected through a long, thin tube (catheter) into each artery. °· Tell your health care provider about any allergies you have, including allergies to contrast dye. °· After the procedure, you will need to lie still for a few hours and drink plenty  of fluids. °This information is not intended to replace advice given to you by your health care provider. Make sure you discuss any questions you have with your health care provider. °Document Revised: 03/09/2019 Document Reviewed: 03/09/2019 °Elsevier Patient Education © 2020 [employer]. ° °

## 2020-04-26 DIAGNOSIS — J45909 Unspecified asthma, uncomplicated: Secondary | ICD-10-CM | POA: Diagnosis not present

## 2020-04-26 DIAGNOSIS — J449 Chronic obstructive pulmonary disease, unspecified: Secondary | ICD-10-CM | POA: Diagnosis not present

## 2020-04-26 DIAGNOSIS — Z01818 Encounter for other preprocedural examination: Secondary | ICD-10-CM | POA: Diagnosis not present

## 2020-04-26 DIAGNOSIS — J440 Chronic obstructive pulmonary disease with acute lower respiratory infection: Secondary | ICD-10-CM | POA: Diagnosis not present

## 2020-04-26 DIAGNOSIS — R0689 Other abnormalities of breathing: Secondary | ICD-10-CM | POA: Diagnosis not present

## 2020-04-29 ENCOUNTER — Encounter (INDEPENDENT_AMBULATORY_CARE_PROVIDER_SITE_OTHER): Payer: Self-pay

## 2020-04-30 DIAGNOSIS — E7800 Pure hypercholesterolemia, unspecified: Secondary | ICD-10-CM | POA: Diagnosis not present

## 2020-04-30 DIAGNOSIS — E1159 Type 2 diabetes mellitus with other circulatory complications: Secondary | ICD-10-CM | POA: Diagnosis not present

## 2020-04-30 DIAGNOSIS — I2510 Atherosclerotic heart disease of native coronary artery without angina pectoris: Secondary | ICD-10-CM | POA: Diagnosis not present

## 2020-04-30 DIAGNOSIS — I152 Hypertension secondary to endocrine disorders: Secondary | ICD-10-CM | POA: Diagnosis not present

## 2020-04-30 DIAGNOSIS — I5033 Acute on chronic diastolic (congestive) heart failure: Secondary | ICD-10-CM | POA: Diagnosis not present

## 2020-05-01 DIAGNOSIS — Z01818 Encounter for other preprocedural examination: Secondary | ICD-10-CM | POA: Diagnosis not present

## 2020-05-13 DIAGNOSIS — I8393 Asymptomatic varicose veins of bilateral lower extremities: Secondary | ICD-10-CM | POA: Diagnosis not present

## 2020-05-13 DIAGNOSIS — I272 Pulmonary hypertension, unspecified: Secondary | ICD-10-CM | POA: Diagnosis not present

## 2020-05-13 DIAGNOSIS — Z575 Occupational exposure to toxic agents in other industries: Secondary | ICD-10-CM | POA: Diagnosis not present

## 2020-05-13 DIAGNOSIS — J439 Emphysema, unspecified: Secondary | ICD-10-CM | POA: Diagnosis not present

## 2020-05-13 DIAGNOSIS — J449 Chronic obstructive pulmonary disease, unspecified: Secondary | ICD-10-CM | POA: Diagnosis not present

## 2020-05-13 DIAGNOSIS — R0609 Other forms of dyspnea: Secondary | ICD-10-CM | POA: Diagnosis not present

## 2020-05-13 DIAGNOSIS — I152 Hypertension secondary to endocrine disorders: Secondary | ICD-10-CM | POA: Diagnosis not present

## 2020-05-13 DIAGNOSIS — E7849 Other hyperlipidemia: Secondary | ICD-10-CM | POA: Diagnosis not present

## 2020-05-13 DIAGNOSIS — I5032 Chronic diastolic (congestive) heart failure: Secondary | ICD-10-CM | POA: Diagnosis not present

## 2020-05-13 DIAGNOSIS — Z8673 Personal history of transient ischemic attack (TIA), and cerebral infarction without residual deficits: Secondary | ICD-10-CM | POA: Diagnosis not present

## 2020-05-13 DIAGNOSIS — I2510 Atherosclerotic heart disease of native coronary artery without angina pectoris: Secondary | ICD-10-CM | POA: Diagnosis not present

## 2020-05-13 DIAGNOSIS — E1169 Type 2 diabetes mellitus with other specified complication: Secondary | ICD-10-CM | POA: Diagnosis not present

## 2020-05-19 DIAGNOSIS — I152 Hypertension secondary to endocrine disorders: Secondary | ICD-10-CM | POA: Diagnosis not present

## 2020-05-19 DIAGNOSIS — Z01818 Encounter for other preprocedural examination: Secondary | ICD-10-CM | POA: Diagnosis not present

## 2020-05-19 DIAGNOSIS — I482 Chronic atrial fibrillation, unspecified: Secondary | ICD-10-CM | POA: Diagnosis not present

## 2020-05-19 DIAGNOSIS — I10 Essential (primary) hypertension: Secondary | ICD-10-CM | POA: Diagnosis not present

## 2020-05-19 DIAGNOSIS — I2510 Atherosclerotic heart disease of native coronary artery without angina pectoris: Secondary | ICD-10-CM | POA: Diagnosis not present

## 2020-05-19 DIAGNOSIS — J90 Pleural effusion, not elsewhere classified: Secondary | ICD-10-CM | POA: Diagnosis not present

## 2020-05-19 DIAGNOSIS — Z9889 Other specified postprocedural states: Secondary | ICD-10-CM | POA: Diagnosis not present

## 2020-05-19 DIAGNOSIS — I130 Hypertensive heart and chronic kidney disease with heart failure and stage 1 through stage 4 chronic kidney disease, or unspecified chronic kidney disease: Secondary | ICD-10-CM | POA: Diagnosis not present

## 2020-05-19 DIAGNOSIS — G8918 Other acute postprocedural pain: Secondary | ICD-10-CM | POA: Diagnosis not present

## 2020-05-19 DIAGNOSIS — Z743 Need for continuous supervision: Secondary | ICD-10-CM | POA: Diagnosis not present

## 2020-05-19 DIAGNOSIS — Z8673 Personal history of transient ischemic attack (TIA), and cerebral infarction without residual deficits: Secondary | ICD-10-CM | POA: Diagnosis not present

## 2020-05-19 DIAGNOSIS — Z794 Long term (current) use of insulin: Secondary | ICD-10-CM | POA: Diagnosis not present

## 2020-05-19 DIAGNOSIS — J9622 Acute and chronic respiratory failure with hypercapnia: Secondary | ICD-10-CM | POA: Diagnosis not present

## 2020-05-19 DIAGNOSIS — I25810 Atherosclerosis of coronary artery bypass graft(s) without angina pectoris: Secondary | ICD-10-CM | POA: Diagnosis not present

## 2020-05-19 DIAGNOSIS — I5033 Acute on chronic diastolic (congestive) heart failure: Secondary | ICD-10-CM | POA: Diagnosis not present

## 2020-05-19 DIAGNOSIS — I8393 Asymptomatic varicose veins of bilateral lower extremities: Secondary | ICD-10-CM | POA: Diagnosis not present

## 2020-05-19 DIAGNOSIS — I5032 Chronic diastolic (congestive) heart failure: Secondary | ICD-10-CM | POA: Diagnosis not present

## 2020-05-19 DIAGNOSIS — Z4682 Encounter for fitting and adjustment of non-vascular catheter: Secondary | ICD-10-CM | POA: Diagnosis not present

## 2020-05-19 DIAGNOSIS — F0151 Vascular dementia with behavioral disturbance: Secondary | ICD-10-CM | POA: Diagnosis not present

## 2020-05-19 DIAGNOSIS — E65 Localized adiposity: Secondary | ICD-10-CM | POA: Diagnosis not present

## 2020-05-19 DIAGNOSIS — R279 Unspecified lack of coordination: Secondary | ICD-10-CM | POA: Diagnosis not present

## 2020-05-19 DIAGNOSIS — E1159 Type 2 diabetes mellitus with other circulatory complications: Secondary | ICD-10-CM | POA: Diagnosis not present

## 2020-05-19 DIAGNOSIS — M47814 Spondylosis without myelopathy or radiculopathy, thoracic region: Secondary | ICD-10-CM | POA: Diagnosis not present

## 2020-05-19 DIAGNOSIS — J189 Pneumonia, unspecified organism: Secondary | ICD-10-CM | POA: Diagnosis not present

## 2020-05-19 DIAGNOSIS — I517 Cardiomegaly: Secondary | ICD-10-CM | POA: Diagnosis not present

## 2020-05-19 DIAGNOSIS — J9811 Atelectasis: Secondary | ICD-10-CM | POA: Diagnosis not present

## 2020-05-19 DIAGNOSIS — R143 Flatulence: Secondary | ICD-10-CM | POA: Diagnosis not present

## 2020-05-19 DIAGNOSIS — R1011 Right upper quadrant pain: Secondary | ICD-10-CM | POA: Diagnosis not present

## 2020-05-19 DIAGNOSIS — J440 Chronic obstructive pulmonary disease with acute lower respiratory infection: Secondary | ICD-10-CM | POA: Diagnosis not present

## 2020-05-19 DIAGNOSIS — J9621 Acute and chronic respiratory failure with hypoxia: Secondary | ICD-10-CM | POA: Diagnosis not present

## 2020-05-19 DIAGNOSIS — J811 Chronic pulmonary edema: Secondary | ICD-10-CM | POA: Diagnosis not present

## 2020-05-19 DIAGNOSIS — E1165 Type 2 diabetes mellitus with hyperglycemia: Secondary | ICD-10-CM | POA: Diagnosis not present

## 2020-05-19 DIAGNOSIS — Z951 Presence of aortocoronary bypass graft: Secondary | ICD-10-CM | POA: Diagnosis not present

## 2020-05-19 DIAGNOSIS — N179 Acute kidney failure, unspecified: Secondary | ICD-10-CM | POA: Diagnosis not present

## 2020-05-19 DIAGNOSIS — E538 Deficiency of other specified B group vitamins: Secondary | ICD-10-CM | POA: Diagnosis not present

## 2020-05-19 DIAGNOSIS — R0689 Other abnormalities of breathing: Secondary | ICD-10-CM | POA: Diagnosis not present

## 2020-05-19 DIAGNOSIS — R846 Abnormal cytological findings in specimens from respiratory organs and thorax: Secondary | ICD-10-CM | POA: Diagnosis not present

## 2020-05-19 DIAGNOSIS — F05 Delirium due to known physiological condition: Secondary | ICD-10-CM | POA: Diagnosis not present

## 2020-05-19 DIAGNOSIS — I700 Atherosclerosis of aorta: Secondary | ICD-10-CM | POA: Diagnosis not present

## 2020-05-19 DIAGNOSIS — J45909 Unspecified asthma, uncomplicated: Secondary | ICD-10-CM | POA: Diagnosis not present

## 2020-05-19 DIAGNOSIS — I340 Nonrheumatic mitral (valve) insufficiency: Secondary | ICD-10-CM | POA: Diagnosis not present

## 2020-05-19 DIAGNOSIS — R918 Other nonspecific abnormal finding of lung field: Secondary | ICD-10-CM | POA: Diagnosis not present

## 2020-05-19 DIAGNOSIS — J449 Chronic obstructive pulmonary disease, unspecified: Secondary | ICD-10-CM | POA: Diagnosis not present

## 2020-05-19 DIAGNOSIS — D72829 Elevated white blood cell count, unspecified: Secondary | ICD-10-CM | POA: Diagnosis not present

## 2020-05-19 DIAGNOSIS — N1831 Chronic kidney disease, stage 3a: Secondary | ICD-10-CM | POA: Diagnosis not present

## 2020-05-20 DIAGNOSIS — E1165 Type 2 diabetes mellitus with hyperglycemia: Secondary | ICD-10-CM | POA: Diagnosis not present

## 2020-05-20 DIAGNOSIS — Z951 Presence of aortocoronary bypass graft: Secondary | ICD-10-CM | POA: Diagnosis not present

## 2020-05-20 DIAGNOSIS — I25810 Atherosclerosis of coronary artery bypass graft(s) without angina pectoris: Secondary | ICD-10-CM | POA: Diagnosis not present

## 2020-05-20 DIAGNOSIS — I340 Nonrheumatic mitral (valve) insufficiency: Secondary | ICD-10-CM | POA: Diagnosis not present

## 2020-05-20 DIAGNOSIS — Z4682 Encounter for fitting and adjustment of non-vascular catheter: Secondary | ICD-10-CM | POA: Diagnosis not present

## 2020-05-20 DIAGNOSIS — I2510 Atherosclerotic heart disease of native coronary artery without angina pectoris: Secondary | ICD-10-CM | POA: Diagnosis not present

## 2020-05-20 DIAGNOSIS — Z8673 Personal history of transient ischemic attack (TIA), and cerebral infarction without residual deficits: Secondary | ICD-10-CM | POA: Diagnosis not present

## 2020-05-20 DIAGNOSIS — N1831 Chronic kidney disease, stage 3a: Secondary | ICD-10-CM | POA: Diagnosis not present

## 2020-05-20 DIAGNOSIS — I10 Essential (primary) hypertension: Secondary | ICD-10-CM | POA: Diagnosis not present

## 2020-05-20 DIAGNOSIS — I5032 Chronic diastolic (congestive) heart failure: Secondary | ICD-10-CM | POA: Diagnosis not present

## 2020-05-20 DIAGNOSIS — Z794 Long term (current) use of insulin: Secondary | ICD-10-CM | POA: Diagnosis not present

## 2020-05-21 DIAGNOSIS — I2510 Atherosclerotic heart disease of native coronary artery without angina pectoris: Secondary | ICD-10-CM | POA: Diagnosis not present

## 2020-05-21 DIAGNOSIS — Z8673 Personal history of transient ischemic attack (TIA), and cerebral infarction without residual deficits: Secondary | ICD-10-CM | POA: Diagnosis not present

## 2020-05-21 DIAGNOSIS — I5033 Acute on chronic diastolic (congestive) heart failure: Secondary | ICD-10-CM | POA: Diagnosis not present

## 2020-05-21 DIAGNOSIS — R918 Other nonspecific abnormal finding of lung field: Secondary | ICD-10-CM | POA: Diagnosis not present

## 2020-05-21 DIAGNOSIS — Z794 Long term (current) use of insulin: Secondary | ICD-10-CM | POA: Diagnosis not present

## 2020-05-21 DIAGNOSIS — E1165 Type 2 diabetes mellitus with hyperglycemia: Secondary | ICD-10-CM | POA: Diagnosis not present

## 2020-05-21 DIAGNOSIS — J9811 Atelectasis: Secondary | ICD-10-CM | POA: Diagnosis not present

## 2020-05-21 DIAGNOSIS — I5032 Chronic diastolic (congestive) heart failure: Secondary | ICD-10-CM | POA: Diagnosis not present

## 2020-05-21 DIAGNOSIS — N1831 Chronic kidney disease, stage 3a: Secondary | ICD-10-CM | POA: Diagnosis not present

## 2020-05-21 DIAGNOSIS — Z4682 Encounter for fitting and adjustment of non-vascular catheter: Secondary | ICD-10-CM | POA: Diagnosis not present

## 2020-05-22 DIAGNOSIS — Z4682 Encounter for fitting and adjustment of non-vascular catheter: Secondary | ICD-10-CM | POA: Diagnosis not present

## 2020-05-22 DIAGNOSIS — Z8673 Personal history of transient ischemic attack (TIA), and cerebral infarction without residual deficits: Secondary | ICD-10-CM | POA: Diagnosis not present

## 2020-05-22 DIAGNOSIS — N1831 Chronic kidney disease, stage 3a: Secondary | ICD-10-CM | POA: Diagnosis not present

## 2020-05-22 DIAGNOSIS — I5032 Chronic diastolic (congestive) heart failure: Secondary | ICD-10-CM | POA: Diagnosis not present

## 2020-05-23 DIAGNOSIS — Z4682 Encounter for fitting and adjustment of non-vascular catheter: Secondary | ICD-10-CM | POA: Diagnosis not present

## 2020-05-23 DIAGNOSIS — I5032 Chronic diastolic (congestive) heart failure: Secondary | ICD-10-CM | POA: Diagnosis not present

## 2020-05-23 DIAGNOSIS — I2510 Atherosclerotic heart disease of native coronary artery without angina pectoris: Secondary | ICD-10-CM | POA: Diagnosis not present

## 2020-05-23 DIAGNOSIS — R143 Flatulence: Secondary | ICD-10-CM | POA: Diagnosis not present

## 2020-05-23 DIAGNOSIS — Z951 Presence of aortocoronary bypass graft: Secondary | ICD-10-CM | POA: Diagnosis not present

## 2020-05-23 DIAGNOSIS — J9811 Atelectasis: Secondary | ICD-10-CM | POA: Diagnosis not present

## 2020-05-23 DIAGNOSIS — J90 Pleural effusion, not elsewhere classified: Secondary | ICD-10-CM | POA: Diagnosis not present

## 2020-05-23 DIAGNOSIS — I517 Cardiomegaly: Secondary | ICD-10-CM | POA: Diagnosis not present

## 2020-05-23 DIAGNOSIS — N1831 Chronic kidney disease, stage 3a: Secondary | ICD-10-CM | POA: Diagnosis not present

## 2020-05-24 DIAGNOSIS — R918 Other nonspecific abnormal finding of lung field: Secondary | ICD-10-CM | POA: Diagnosis not present

## 2020-05-24 DIAGNOSIS — Z8673 Personal history of transient ischemic attack (TIA), and cerebral infarction without residual deficits: Secondary | ICD-10-CM | POA: Diagnosis not present

## 2020-05-24 DIAGNOSIS — N1831 Chronic kidney disease, stage 3a: Secondary | ICD-10-CM | POA: Diagnosis not present

## 2020-05-24 DIAGNOSIS — I5032 Chronic diastolic (congestive) heart failure: Secondary | ICD-10-CM | POA: Diagnosis not present

## 2020-05-24 DIAGNOSIS — Z4682 Encounter for fitting and adjustment of non-vascular catheter: Secondary | ICD-10-CM | POA: Diagnosis not present

## 2020-05-25 DIAGNOSIS — N1831 Chronic kidney disease, stage 3a: Secondary | ICD-10-CM | POA: Diagnosis not present

## 2020-05-25 DIAGNOSIS — Z8673 Personal history of transient ischemic attack (TIA), and cerebral infarction without residual deficits: Secondary | ICD-10-CM | POA: Diagnosis not present

## 2020-05-25 DIAGNOSIS — I5032 Chronic diastolic (congestive) heart failure: Secondary | ICD-10-CM | POA: Diagnosis not present

## 2020-05-25 DIAGNOSIS — J90 Pleural effusion, not elsewhere classified: Secondary | ICD-10-CM | POA: Diagnosis not present

## 2020-05-26 DIAGNOSIS — Z8673 Personal history of transient ischemic attack (TIA), and cerebral infarction without residual deficits: Secondary | ICD-10-CM | POA: Diagnosis not present

## 2020-05-26 DIAGNOSIS — J90 Pleural effusion, not elsewhere classified: Secondary | ICD-10-CM | POA: Diagnosis not present

## 2020-05-26 DIAGNOSIS — I517 Cardiomegaly: Secondary | ICD-10-CM | POA: Diagnosis not present

## 2020-05-27 DIAGNOSIS — Z9889 Other specified postprocedural states: Secondary | ICD-10-CM | POA: Diagnosis not present

## 2020-05-27 DIAGNOSIS — E1165 Type 2 diabetes mellitus with hyperglycemia: Secondary | ICD-10-CM | POA: Diagnosis not present

## 2020-05-27 DIAGNOSIS — J45909 Unspecified asthma, uncomplicated: Secondary | ICD-10-CM | POA: Diagnosis not present

## 2020-05-27 DIAGNOSIS — J90 Pleural effusion, not elsewhere classified: Secondary | ICD-10-CM | POA: Diagnosis not present

## 2020-05-27 DIAGNOSIS — J811 Chronic pulmonary edema: Secondary | ICD-10-CM | POA: Diagnosis not present

## 2020-05-27 DIAGNOSIS — I5032 Chronic diastolic (congestive) heart failure: Secondary | ICD-10-CM | POA: Diagnosis not present

## 2020-05-27 DIAGNOSIS — Z951 Presence of aortocoronary bypass graft: Secondary | ICD-10-CM | POA: Diagnosis not present

## 2020-05-27 DIAGNOSIS — J449 Chronic obstructive pulmonary disease, unspecified: Secondary | ICD-10-CM | POA: Diagnosis not present

## 2020-05-27 DIAGNOSIS — R0689 Other abnormalities of breathing: Secondary | ICD-10-CM | POA: Diagnosis not present

## 2020-05-27 DIAGNOSIS — Z794 Long term (current) use of insulin: Secondary | ICD-10-CM | POA: Diagnosis not present

## 2020-05-27 DIAGNOSIS — N1831 Chronic kidney disease, stage 3a: Secondary | ICD-10-CM | POA: Diagnosis not present

## 2020-05-27 DIAGNOSIS — J9811 Atelectasis: Secondary | ICD-10-CM | POA: Diagnosis not present

## 2020-05-27 DIAGNOSIS — J440 Chronic obstructive pulmonary disease with acute lower respiratory infection: Secondary | ICD-10-CM | POA: Diagnosis not present

## 2020-05-27 DIAGNOSIS — Z4682 Encounter for fitting and adjustment of non-vascular catheter: Secondary | ICD-10-CM | POA: Diagnosis not present

## 2020-05-28 DIAGNOSIS — J90 Pleural effusion, not elsewhere classified: Secondary | ICD-10-CM | POA: Diagnosis not present

## 2020-05-28 DIAGNOSIS — Z951 Presence of aortocoronary bypass graft: Secondary | ICD-10-CM | POA: Diagnosis not present

## 2020-05-28 DIAGNOSIS — J9811 Atelectasis: Secondary | ICD-10-CM | POA: Diagnosis not present

## 2020-05-28 DIAGNOSIS — E1165 Type 2 diabetes mellitus with hyperglycemia: Secondary | ICD-10-CM | POA: Diagnosis not present

## 2020-05-28 DIAGNOSIS — F05 Delirium due to known physiological condition: Secondary | ICD-10-CM | POA: Diagnosis not present

## 2020-05-28 DIAGNOSIS — Z9889 Other specified postprocedural states: Secondary | ICD-10-CM | POA: Diagnosis not present

## 2020-05-28 DIAGNOSIS — F0151 Vascular dementia with behavioral disturbance: Secondary | ICD-10-CM | POA: Diagnosis not present

## 2020-05-28 DIAGNOSIS — Z794 Long term (current) use of insulin: Secondary | ICD-10-CM | POA: Diagnosis not present

## 2020-05-29 DIAGNOSIS — Z8673 Personal history of transient ischemic attack (TIA), and cerebral infarction without residual deficits: Secondary | ICD-10-CM | POA: Diagnosis not present

## 2020-05-29 DIAGNOSIS — R143 Flatulence: Secondary | ICD-10-CM | POA: Diagnosis not present

## 2020-05-29 DIAGNOSIS — Z794 Long term (current) use of insulin: Secondary | ICD-10-CM | POA: Diagnosis not present

## 2020-05-29 DIAGNOSIS — I5032 Chronic diastolic (congestive) heart failure: Secondary | ICD-10-CM | POA: Diagnosis not present

## 2020-05-29 DIAGNOSIS — Z9889 Other specified postprocedural states: Secondary | ICD-10-CM | POA: Diagnosis not present

## 2020-05-29 DIAGNOSIS — Z951 Presence of aortocoronary bypass graft: Secondary | ICD-10-CM | POA: Diagnosis not present

## 2020-05-29 DIAGNOSIS — Z4682 Encounter for fitting and adjustment of non-vascular catheter: Secondary | ICD-10-CM | POA: Diagnosis not present

## 2020-05-29 DIAGNOSIS — N1831 Chronic kidney disease, stage 3a: Secondary | ICD-10-CM | POA: Diagnosis not present

## 2020-05-29 DIAGNOSIS — E1165 Type 2 diabetes mellitus with hyperglycemia: Secondary | ICD-10-CM | POA: Diagnosis not present

## 2020-05-29 DIAGNOSIS — J9811 Atelectasis: Secondary | ICD-10-CM | POA: Diagnosis not present

## 2020-05-29 DIAGNOSIS — J811 Chronic pulmonary edema: Secondary | ICD-10-CM | POA: Diagnosis not present

## 2020-05-29 DIAGNOSIS — J90 Pleural effusion, not elsewhere classified: Secondary | ICD-10-CM | POA: Diagnosis not present

## 2020-05-30 DIAGNOSIS — Z8673 Personal history of transient ischemic attack (TIA), and cerebral infarction without residual deficits: Secondary | ICD-10-CM | POA: Diagnosis not present

## 2020-05-30 DIAGNOSIS — E1165 Type 2 diabetes mellitus with hyperglycemia: Secondary | ICD-10-CM | POA: Diagnosis not present

## 2020-05-30 DIAGNOSIS — J90 Pleural effusion, not elsewhere classified: Secondary | ICD-10-CM | POA: Diagnosis not present

## 2020-05-30 DIAGNOSIS — Z9889 Other specified postprocedural states: Secondary | ICD-10-CM | POA: Diagnosis not present

## 2020-05-30 DIAGNOSIS — F0151 Vascular dementia with behavioral disturbance: Secondary | ICD-10-CM | POA: Diagnosis not present

## 2020-05-30 DIAGNOSIS — Z794 Long term (current) use of insulin: Secondary | ICD-10-CM | POA: Diagnosis not present

## 2020-05-30 DIAGNOSIS — Z951 Presence of aortocoronary bypass graft: Secondary | ICD-10-CM | POA: Diagnosis not present

## 2020-05-30 DIAGNOSIS — F05 Delirium due to known physiological condition: Secondary | ICD-10-CM | POA: Diagnosis not present

## 2020-05-31 DIAGNOSIS — E1165 Type 2 diabetes mellitus with hyperglycemia: Secondary | ICD-10-CM | POA: Diagnosis not present

## 2020-05-31 DIAGNOSIS — R1011 Right upper quadrant pain: Secondary | ICD-10-CM | POA: Diagnosis not present

## 2020-05-31 DIAGNOSIS — I2510 Atherosclerotic heart disease of native coronary artery without angina pectoris: Secondary | ICD-10-CM | POA: Diagnosis not present

## 2020-05-31 DIAGNOSIS — Z794 Long term (current) use of insulin: Secondary | ICD-10-CM | POA: Diagnosis not present

## 2020-05-31 DIAGNOSIS — Z951 Presence of aortocoronary bypass graft: Secondary | ICD-10-CM | POA: Diagnosis not present

## 2020-05-31 DIAGNOSIS — J90 Pleural effusion, not elsewhere classified: Secondary | ICD-10-CM | POA: Diagnosis not present

## 2020-05-31 DIAGNOSIS — Z8673 Personal history of transient ischemic attack (TIA), and cerebral infarction without residual deficits: Secondary | ICD-10-CM | POA: Diagnosis not present

## 2020-05-31 DIAGNOSIS — Z9889 Other specified postprocedural states: Secondary | ICD-10-CM | POA: Diagnosis not present

## 2020-05-31 DIAGNOSIS — J811 Chronic pulmonary edema: Secondary | ICD-10-CM | POA: Diagnosis not present

## 2020-06-01 DIAGNOSIS — J90 Pleural effusion, not elsewhere classified: Secondary | ICD-10-CM | POA: Diagnosis not present

## 2020-06-01 DIAGNOSIS — J9811 Atelectasis: Secondary | ICD-10-CM | POA: Diagnosis not present

## 2020-06-01 DIAGNOSIS — Z951 Presence of aortocoronary bypass graft: Secondary | ICD-10-CM | POA: Diagnosis not present

## 2020-06-01 DIAGNOSIS — Z8673 Personal history of transient ischemic attack (TIA), and cerebral infarction without residual deficits: Secondary | ICD-10-CM | POA: Diagnosis not present

## 2020-06-02 DIAGNOSIS — I2510 Atherosclerotic heart disease of native coronary artery without angina pectoris: Secondary | ICD-10-CM | POA: Diagnosis not present

## 2020-06-02 DIAGNOSIS — Z951 Presence of aortocoronary bypass graft: Secondary | ICD-10-CM | POA: Diagnosis not present

## 2020-06-02 DIAGNOSIS — Z8673 Personal history of transient ischemic attack (TIA), and cerebral infarction without residual deficits: Secondary | ICD-10-CM | POA: Diagnosis not present

## 2020-06-02 DIAGNOSIS — J90 Pleural effusion, not elsewhere classified: Secondary | ICD-10-CM | POA: Diagnosis not present

## 2020-06-02 DIAGNOSIS — Z9889 Other specified postprocedural states: Secondary | ICD-10-CM | POA: Diagnosis not present

## 2020-06-02 DIAGNOSIS — J811 Chronic pulmonary edema: Secondary | ICD-10-CM | POA: Diagnosis not present

## 2020-06-03 DIAGNOSIS — E1165 Type 2 diabetes mellitus with hyperglycemia: Secondary | ICD-10-CM | POA: Diagnosis not present

## 2020-06-03 DIAGNOSIS — J9622 Acute and chronic respiratory failure with hypercapnia: Secondary | ICD-10-CM | POA: Diagnosis not present

## 2020-06-03 DIAGNOSIS — J90 Pleural effusion, not elsewhere classified: Secondary | ICD-10-CM | POA: Diagnosis not present

## 2020-06-03 DIAGNOSIS — I482 Chronic atrial fibrillation, unspecified: Secondary | ICD-10-CM | POA: Diagnosis not present

## 2020-06-03 DIAGNOSIS — E1159 Type 2 diabetes mellitus with other circulatory complications: Secondary | ICD-10-CM | POA: Diagnosis not present

## 2020-06-03 DIAGNOSIS — Z951 Presence of aortocoronary bypass graft: Secondary | ICD-10-CM | POA: Diagnosis not present

## 2020-06-03 DIAGNOSIS — Z8673 Personal history of transient ischemic attack (TIA), and cerebral infarction without residual deficits: Secondary | ICD-10-CM | POA: Diagnosis not present

## 2020-06-03 DIAGNOSIS — N179 Acute kidney failure, unspecified: Secondary | ICD-10-CM | POA: Diagnosis not present

## 2020-06-03 DIAGNOSIS — Z4682 Encounter for fitting and adjustment of non-vascular catheter: Secondary | ICD-10-CM | POA: Diagnosis not present

## 2020-06-03 DIAGNOSIS — J9811 Atelectasis: Secondary | ICD-10-CM | POA: Diagnosis not present

## 2020-06-03 DIAGNOSIS — R846 Abnormal cytological findings in specimens from respiratory organs and thorax: Secondary | ICD-10-CM | POA: Diagnosis not present

## 2020-06-03 DIAGNOSIS — Z794 Long term (current) use of insulin: Secondary | ICD-10-CM | POA: Diagnosis not present

## 2020-06-03 DIAGNOSIS — G8918 Other acute postprocedural pain: Secondary | ICD-10-CM | POA: Diagnosis not present

## 2020-06-03 DIAGNOSIS — R0689 Other abnormalities of breathing: Secondary | ICD-10-CM | POA: Diagnosis not present

## 2020-06-03 DIAGNOSIS — J9621 Acute and chronic respiratory failure with hypoxia: Secondary | ICD-10-CM | POA: Diagnosis not present

## 2020-06-03 DIAGNOSIS — I152 Hypertension secondary to endocrine disorders: Secondary | ICD-10-CM | POA: Diagnosis not present

## 2020-06-04 DIAGNOSIS — J90 Pleural effusion, not elsewhere classified: Secondary | ICD-10-CM | POA: Diagnosis not present

## 2020-06-04 DIAGNOSIS — J9811 Atelectasis: Secondary | ICD-10-CM | POA: Diagnosis not present

## 2020-06-04 DIAGNOSIS — E1165 Type 2 diabetes mellitus with hyperglycemia: Secondary | ICD-10-CM | POA: Diagnosis not present

## 2020-06-04 DIAGNOSIS — J9622 Acute and chronic respiratory failure with hypercapnia: Secondary | ICD-10-CM | POA: Diagnosis not present

## 2020-06-04 DIAGNOSIS — J9621 Acute and chronic respiratory failure with hypoxia: Secondary | ICD-10-CM | POA: Diagnosis not present

## 2020-06-04 DIAGNOSIS — I482 Chronic atrial fibrillation, unspecified: Secondary | ICD-10-CM | POA: Diagnosis not present

## 2020-06-04 DIAGNOSIS — Z951 Presence of aortocoronary bypass graft: Secondary | ICD-10-CM | POA: Diagnosis not present

## 2020-06-04 DIAGNOSIS — N179 Acute kidney failure, unspecified: Secondary | ICD-10-CM | POA: Diagnosis not present

## 2020-06-04 DIAGNOSIS — Z4682 Encounter for fitting and adjustment of non-vascular catheter: Secondary | ICD-10-CM | POA: Diagnosis not present

## 2020-06-04 DIAGNOSIS — Z794 Long term (current) use of insulin: Secondary | ICD-10-CM | POA: Diagnosis not present

## 2020-06-04 DIAGNOSIS — Z8673 Personal history of transient ischemic attack (TIA), and cerebral infarction without residual deficits: Secondary | ICD-10-CM | POA: Diagnosis not present

## 2020-06-05 DIAGNOSIS — R918 Other nonspecific abnormal finding of lung field: Secondary | ICD-10-CM | POA: Diagnosis not present

## 2020-06-05 DIAGNOSIS — E1165 Type 2 diabetes mellitus with hyperglycemia: Secondary | ICD-10-CM | POA: Diagnosis not present

## 2020-06-05 DIAGNOSIS — Z794 Long term (current) use of insulin: Secondary | ICD-10-CM | POA: Diagnosis not present

## 2020-06-05 DIAGNOSIS — J90 Pleural effusion, not elsewhere classified: Secondary | ICD-10-CM | POA: Diagnosis not present

## 2020-06-05 DIAGNOSIS — Z4682 Encounter for fitting and adjustment of non-vascular catheter: Secondary | ICD-10-CM | POA: Diagnosis not present

## 2020-06-05 DIAGNOSIS — Z951 Presence of aortocoronary bypass graft: Secondary | ICD-10-CM | POA: Diagnosis not present

## 2020-06-05 DIAGNOSIS — Z8673 Personal history of transient ischemic attack (TIA), and cerebral infarction without residual deficits: Secondary | ICD-10-CM | POA: Diagnosis not present

## 2020-06-06 DIAGNOSIS — E1165 Type 2 diabetes mellitus with hyperglycemia: Secondary | ICD-10-CM | POA: Diagnosis not present

## 2020-06-06 DIAGNOSIS — J9811 Atelectasis: Secondary | ICD-10-CM | POA: Diagnosis not present

## 2020-06-06 DIAGNOSIS — Z4682 Encounter for fitting and adjustment of non-vascular catheter: Secondary | ICD-10-CM | POA: Diagnosis not present

## 2020-06-06 DIAGNOSIS — J90 Pleural effusion, not elsewhere classified: Secondary | ICD-10-CM | POA: Diagnosis not present

## 2020-06-06 DIAGNOSIS — Z951 Presence of aortocoronary bypass graft: Secondary | ICD-10-CM | POA: Diagnosis not present

## 2020-06-06 DIAGNOSIS — Z794 Long term (current) use of insulin: Secondary | ICD-10-CM | POA: Diagnosis not present

## 2020-06-07 DIAGNOSIS — E1165 Type 2 diabetes mellitus with hyperglycemia: Secondary | ICD-10-CM | POA: Diagnosis not present

## 2020-06-07 DIAGNOSIS — R0689 Other abnormalities of breathing: Secondary | ICD-10-CM | POA: Diagnosis not present

## 2020-06-07 DIAGNOSIS — E65 Localized adiposity: Secondary | ICD-10-CM | POA: Diagnosis not present

## 2020-06-07 DIAGNOSIS — Z951 Presence of aortocoronary bypass graft: Secondary | ICD-10-CM | POA: Diagnosis not present

## 2020-06-07 DIAGNOSIS — Z9889 Other specified postprocedural states: Secondary | ICD-10-CM | POA: Diagnosis not present

## 2020-06-07 DIAGNOSIS — R918 Other nonspecific abnormal finding of lung field: Secondary | ICD-10-CM | POA: Diagnosis not present

## 2020-06-07 DIAGNOSIS — J90 Pleural effusion, not elsewhere classified: Secondary | ICD-10-CM | POA: Diagnosis not present

## 2020-06-07 DIAGNOSIS — Z794 Long term (current) use of insulin: Secondary | ICD-10-CM | POA: Diagnosis not present

## 2020-06-08 DIAGNOSIS — E1165 Type 2 diabetes mellitus with hyperglycemia: Secondary | ICD-10-CM | POA: Diagnosis not present

## 2020-06-08 DIAGNOSIS — Z794 Long term (current) use of insulin: Secondary | ICD-10-CM | POA: Diagnosis not present

## 2020-06-08 DIAGNOSIS — R918 Other nonspecific abnormal finding of lung field: Secondary | ICD-10-CM | POA: Diagnosis not present

## 2020-06-08 DIAGNOSIS — J90 Pleural effusion, not elsewhere classified: Secondary | ICD-10-CM | POA: Diagnosis not present

## 2020-06-09 DIAGNOSIS — D72829 Elevated white blood cell count, unspecified: Secondary | ICD-10-CM | POA: Diagnosis not present

## 2020-06-09 DIAGNOSIS — Z9889 Other specified postprocedural states: Secondary | ICD-10-CM | POA: Diagnosis not present

## 2020-06-09 DIAGNOSIS — J9811 Atelectasis: Secondary | ICD-10-CM | POA: Diagnosis not present

## 2020-06-09 DIAGNOSIS — J90 Pleural effusion, not elsewhere classified: Secondary | ICD-10-CM | POA: Diagnosis not present

## 2020-06-10 DIAGNOSIS — J9811 Atelectasis: Secondary | ICD-10-CM | POA: Diagnosis not present

## 2020-06-10 DIAGNOSIS — E1165 Type 2 diabetes mellitus with hyperglycemia: Secondary | ICD-10-CM | POA: Diagnosis not present

## 2020-06-10 DIAGNOSIS — J90 Pleural effusion, not elsewhere classified: Secondary | ICD-10-CM | POA: Diagnosis not present

## 2020-06-10 DIAGNOSIS — Z794 Long term (current) use of insulin: Secondary | ICD-10-CM | POA: Diagnosis not present

## 2020-06-11 DIAGNOSIS — J90 Pleural effusion, not elsewhere classified: Secondary | ICD-10-CM | POA: Diagnosis not present

## 2020-06-11 DIAGNOSIS — E1165 Type 2 diabetes mellitus with hyperglycemia: Secondary | ICD-10-CM | POA: Diagnosis not present

## 2020-06-11 DIAGNOSIS — Z794 Long term (current) use of insulin: Secondary | ICD-10-CM | POA: Diagnosis not present

## 2020-06-12 DIAGNOSIS — I5032 Chronic diastolic (congestive) heart failure: Secondary | ICD-10-CM | POA: Diagnosis not present

## 2020-06-12 DIAGNOSIS — J811 Chronic pulmonary edema: Secondary | ICD-10-CM | POA: Diagnosis not present

## 2020-06-12 DIAGNOSIS — N1831 Chronic kidney disease, stage 3a: Secondary | ICD-10-CM | POA: Diagnosis not present

## 2020-06-12 DIAGNOSIS — Z4682 Encounter for fitting and adjustment of non-vascular catheter: Secondary | ICD-10-CM | POA: Diagnosis not present

## 2020-06-12 DIAGNOSIS — E1165 Type 2 diabetes mellitus with hyperglycemia: Secondary | ICD-10-CM | POA: Diagnosis not present

## 2020-06-12 DIAGNOSIS — Z794 Long term (current) use of insulin: Secondary | ICD-10-CM | POA: Diagnosis not present

## 2020-06-12 DIAGNOSIS — J9811 Atelectasis: Secondary | ICD-10-CM | POA: Diagnosis not present

## 2020-06-12 DIAGNOSIS — J90 Pleural effusion, not elsewhere classified: Secondary | ICD-10-CM | POA: Diagnosis not present

## 2020-06-12 DIAGNOSIS — Z9889 Other specified postprocedural states: Secondary | ICD-10-CM | POA: Diagnosis not present

## 2020-06-12 DIAGNOSIS — Z951 Presence of aortocoronary bypass graft: Secondary | ICD-10-CM | POA: Diagnosis not present

## 2020-06-13 DIAGNOSIS — Z794 Long term (current) use of insulin: Secondary | ICD-10-CM | POA: Diagnosis not present

## 2020-06-13 DIAGNOSIS — J90 Pleural effusion, not elsewhere classified: Secondary | ICD-10-CM | POA: Diagnosis not present

## 2020-06-13 DIAGNOSIS — E1165 Type 2 diabetes mellitus with hyperglycemia: Secondary | ICD-10-CM | POA: Diagnosis not present

## 2020-06-14 DIAGNOSIS — Z951 Presence of aortocoronary bypass graft: Secondary | ICD-10-CM | POA: Diagnosis not present

## 2020-06-14 DIAGNOSIS — E1165 Type 2 diabetes mellitus with hyperglycemia: Secondary | ICD-10-CM | POA: Diagnosis not present

## 2020-06-14 DIAGNOSIS — Z794 Long term (current) use of insulin: Secondary | ICD-10-CM | POA: Diagnosis not present

## 2020-06-15 DIAGNOSIS — Z951 Presence of aortocoronary bypass graft: Secondary | ICD-10-CM | POA: Diagnosis not present

## 2020-06-16 DIAGNOSIS — Z951 Presence of aortocoronary bypass graft: Secondary | ICD-10-CM | POA: Diagnosis not present

## 2020-06-16 DIAGNOSIS — Z9889 Other specified postprocedural states: Secondary | ICD-10-CM | POA: Diagnosis not present

## 2020-06-16 DIAGNOSIS — M47814 Spondylosis without myelopathy or radiculopathy, thoracic region: Secondary | ICD-10-CM | POA: Diagnosis not present

## 2020-06-16 DIAGNOSIS — J90 Pleural effusion, not elsewhere classified: Secondary | ICD-10-CM | POA: Diagnosis not present

## 2020-06-17 DIAGNOSIS — G939 Disorder of brain, unspecified: Secondary | ICD-10-CM | POA: Diagnosis not present

## 2020-06-17 DIAGNOSIS — E1122 Type 2 diabetes mellitus with diabetic chronic kidney disease: Secondary | ICD-10-CM | POA: Diagnosis present

## 2020-06-17 DIAGNOSIS — G4730 Sleep apnea, unspecified: Secondary | ICD-10-CM | POA: Diagnosis present

## 2020-06-17 DIAGNOSIS — I5043 Acute on chronic combined systolic (congestive) and diastolic (congestive) heart failure: Secondary | ICD-10-CM | POA: Diagnosis not present

## 2020-06-17 DIAGNOSIS — I469 Cardiac arrest, cause unspecified: Secondary | ICD-10-CM | POA: Diagnosis not present

## 2020-06-17 DIAGNOSIS — I672 Cerebral atherosclerosis: Secondary | ICD-10-CM | POA: Diagnosis not present

## 2020-06-17 DIAGNOSIS — E1165 Type 2 diabetes mellitus with hyperglycemia: Secondary | ICD-10-CM | POA: Diagnosis not present

## 2020-06-17 DIAGNOSIS — I509 Heart failure, unspecified: Secondary | ICD-10-CM | POA: Diagnosis not present

## 2020-06-17 DIAGNOSIS — F0390 Unspecified dementia without behavioral disturbance: Secondary | ICD-10-CM | POA: Diagnosis present

## 2020-06-17 DIAGNOSIS — J9602 Acute respiratory failure with hypercapnia: Secondary | ICD-10-CM | POA: Diagnosis not present

## 2020-06-17 DIAGNOSIS — I4891 Unspecified atrial fibrillation: Secondary | ICD-10-CM | POA: Diagnosis not present

## 2020-06-17 DIAGNOSIS — N184 Chronic kidney disease, stage 4 (severe): Secondary | ICD-10-CM | POA: Diagnosis present

## 2020-06-17 DIAGNOSIS — I130 Hypertensive heart and chronic kidney disease with heart failure and stage 1 through stage 4 chronic kidney disease, or unspecified chronic kidney disease: Secondary | ICD-10-CM | POA: Diagnosis present

## 2020-06-17 DIAGNOSIS — R4020 Unspecified coma: Secondary | ICD-10-CM | POA: Diagnosis not present

## 2020-06-17 DIAGNOSIS — N179 Acute kidney failure, unspecified: Secondary | ICD-10-CM | POA: Diagnosis present

## 2020-06-17 DIAGNOSIS — I462 Cardiac arrest due to underlying cardiac condition: Secondary | ICD-10-CM | POA: Diagnosis present

## 2020-06-17 DIAGNOSIS — Z794 Long term (current) use of insulin: Secondary | ICD-10-CM | POA: Diagnosis not present

## 2020-06-17 DIAGNOSIS — Z20822 Contact with and (suspected) exposure to covid-19: Secondary | ICD-10-CM | POA: Diagnosis not present

## 2020-06-17 DIAGNOSIS — J9811 Atelectasis: Secondary | ICD-10-CM | POA: Diagnosis not present

## 2020-06-17 DIAGNOSIS — Z951 Presence of aortocoronary bypass graft: Secondary | ICD-10-CM | POA: Diagnosis not present

## 2020-06-17 DIAGNOSIS — I255 Ischemic cardiomyopathy: Secondary | ICD-10-CM | POA: Diagnosis not present

## 2020-06-17 DIAGNOSIS — Z66 Do not resuscitate: Secondary | ICD-10-CM | POA: Diagnosis not present

## 2020-06-17 DIAGNOSIS — Z87442 Personal history of urinary calculi: Secondary | ICD-10-CM | POA: Diagnosis not present

## 2020-06-17 DIAGNOSIS — J90 Pleural effusion, not elsewhere classified: Secondary | ICD-10-CM | POA: Diagnosis not present

## 2020-06-17 DIAGNOSIS — R0689 Other abnormalities of breathing: Secondary | ICD-10-CM | POA: Diagnosis not present

## 2020-06-17 DIAGNOSIS — R404 Transient alteration of awareness: Secondary | ICD-10-CM | POA: Diagnosis not present

## 2020-06-17 DIAGNOSIS — J9601 Acute respiratory failure with hypoxia: Secondary | ICD-10-CM | POA: Diagnosis not present

## 2020-06-17 DIAGNOSIS — E785 Hyperlipidemia, unspecified: Secondary | ICD-10-CM | POA: Diagnosis present

## 2020-06-17 DIAGNOSIS — Z6835 Body mass index (BMI) 35.0-35.9, adult: Secondary | ICD-10-CM | POA: Diagnosis not present

## 2020-06-17 DIAGNOSIS — Z743 Need for continuous supervision: Secondary | ICD-10-CM | POA: Diagnosis not present

## 2020-06-17 DIAGNOSIS — N400 Enlarged prostate without lower urinary tract symptoms: Secondary | ICD-10-CM | POA: Diagnosis present

## 2020-06-17 DIAGNOSIS — I2510 Atherosclerotic heart disease of native coronary artery without angina pectoris: Secondary | ICD-10-CM | POA: Diagnosis not present

## 2020-06-17 DIAGNOSIS — Z8673 Personal history of transient ischemic attack (TIA), and cerebral infarction without residual deficits: Secondary | ICD-10-CM | POA: Diagnosis not present

## 2020-06-17 DIAGNOSIS — G928 Other toxic encephalopathy: Secondary | ICD-10-CM | POA: Diagnosis not present

## 2020-06-17 DIAGNOSIS — F32A Depression, unspecified: Secondary | ICD-10-CM | POA: Diagnosis present

## 2020-06-17 DIAGNOSIS — I6782 Cerebral ischemia: Secondary | ICD-10-CM | POA: Diagnosis not present

## 2020-06-17 DIAGNOSIS — G319 Degenerative disease of nervous system, unspecified: Secondary | ICD-10-CM | POA: Diagnosis not present

## 2020-06-17 DIAGNOSIS — Z515 Encounter for palliative care: Secondary | ICD-10-CM | POA: Diagnosis not present

## 2020-06-17 DIAGNOSIS — G931 Anoxic brain damage, not elsewhere classified: Secondary | ICD-10-CM | POA: Diagnosis not present

## 2020-06-17 DIAGNOSIS — R279 Unspecified lack of coordination: Secondary | ICD-10-CM | POA: Diagnosis not present

## 2020-06-17 DIAGNOSIS — I110 Hypertensive heart disease with heart failure: Secondary | ICD-10-CM | POA: Diagnosis not present

## 2020-06-17 DIAGNOSIS — K219 Gastro-esophageal reflux disease without esophagitis: Secondary | ICD-10-CM | POA: Diagnosis present

## 2020-06-18 ENCOUNTER — Inpatient Hospital Stay: Payer: Medicare HMO

## 2020-06-18 ENCOUNTER — Emergency Department: Payer: Medicare HMO

## 2020-06-18 ENCOUNTER — Other Ambulatory Visit: Payer: Self-pay

## 2020-06-18 ENCOUNTER — Inpatient Hospital Stay
Admission: EM | Admit: 2020-06-18 | Discharge: 2020-07-01 | DRG: 302 | Disposition: E | Payer: Medicare HMO | Source: Skilled Nursing Facility | Attending: Internal Medicine | Admitting: Internal Medicine

## 2020-06-18 DIAGNOSIS — Z6835 Body mass index (BMI) 35.0-35.9, adult: Secondary | ICD-10-CM | POA: Diagnosis not present

## 2020-06-18 DIAGNOSIS — Z720 Tobacco use: Secondary | ICD-10-CM

## 2020-06-18 DIAGNOSIS — J9602 Acute respiratory failure with hypercapnia: Secondary | ICD-10-CM | POA: Diagnosis present

## 2020-06-18 DIAGNOSIS — R0689 Other abnormalities of breathing: Secondary | ICD-10-CM | POA: Diagnosis not present

## 2020-06-18 DIAGNOSIS — Z515 Encounter for palliative care: Secondary | ICD-10-CM

## 2020-06-18 DIAGNOSIS — Z87442 Personal history of urinary calculi: Secondary | ICD-10-CM | POA: Diagnosis not present

## 2020-06-18 DIAGNOSIS — F32A Depression, unspecified: Secondary | ICD-10-CM | POA: Diagnosis present

## 2020-06-18 DIAGNOSIS — D631 Anemia in chronic kidney disease: Secondary | ICD-10-CM | POA: Diagnosis present

## 2020-06-18 DIAGNOSIS — I4891 Unspecified atrial fibrillation: Secondary | ICD-10-CM | POA: Diagnosis not present

## 2020-06-18 DIAGNOSIS — J9601 Acute respiratory failure with hypoxia: Principal | ICD-10-CM | POA: Diagnosis present

## 2020-06-18 DIAGNOSIS — G4730 Sleep apnea, unspecified: Secondary | ICD-10-CM | POA: Diagnosis present

## 2020-06-18 DIAGNOSIS — Z66 Do not resuscitate: Secondary | ICD-10-CM | POA: Diagnosis not present

## 2020-06-18 DIAGNOSIS — L89152 Pressure ulcer of sacral region, stage 2: Secondary | ICD-10-CM | POA: Diagnosis present

## 2020-06-18 DIAGNOSIS — N400 Enlarged prostate without lower urinary tract symptoms: Secondary | ICD-10-CM | POA: Diagnosis present

## 2020-06-18 DIAGNOSIS — F0390 Unspecified dementia without behavioral disturbance: Secondary | ICD-10-CM | POA: Diagnosis present

## 2020-06-18 DIAGNOSIS — I462 Cardiac arrest due to underlying cardiac condition: Secondary | ICD-10-CM | POA: Diagnosis present

## 2020-06-18 DIAGNOSIS — R4020 Unspecified coma: Secondary | ICD-10-CM | POA: Diagnosis not present

## 2020-06-18 DIAGNOSIS — Z7901 Long term (current) use of anticoagulants: Secondary | ICD-10-CM

## 2020-06-18 DIAGNOSIS — G931 Anoxic brain damage, not elsewhere classified: Secondary | ICD-10-CM | POA: Diagnosis not present

## 2020-06-18 DIAGNOSIS — M109 Gout, unspecified: Secondary | ICD-10-CM | POA: Diagnosis present

## 2020-06-18 DIAGNOSIS — M1990 Unspecified osteoarthritis, unspecified site: Secondary | ICD-10-CM | POA: Diagnosis present

## 2020-06-18 DIAGNOSIS — J9811 Atelectasis: Secondary | ICD-10-CM | POA: Diagnosis not present

## 2020-06-18 DIAGNOSIS — I5043 Acute on chronic combined systolic (congestive) and diastolic (congestive) heart failure: Secondary | ICD-10-CM | POA: Diagnosis present

## 2020-06-18 DIAGNOSIS — Z20822 Contact with and (suspected) exposure to covid-19: Secondary | ICD-10-CM | POA: Diagnosis not present

## 2020-06-18 DIAGNOSIS — N179 Acute kidney failure, unspecified: Secondary | ICD-10-CM | POA: Diagnosis present

## 2020-06-18 DIAGNOSIS — Z87891 Personal history of nicotine dependence: Secondary | ICD-10-CM

## 2020-06-18 DIAGNOSIS — R0602 Shortness of breath: Secondary | ICD-10-CM | POA: Diagnosis not present

## 2020-06-18 DIAGNOSIS — I509 Heart failure, unspecified: Secondary | ICD-10-CM | POA: Diagnosis not present

## 2020-06-18 DIAGNOSIS — N184 Chronic kidney disease, stage 4 (severe): Secondary | ICD-10-CM | POA: Diagnosis present

## 2020-06-18 DIAGNOSIS — I130 Hypertensive heart and chronic kidney disease with heart failure and stage 1 through stage 4 chronic kidney disease, or unspecified chronic kidney disease: Secondary | ICD-10-CM | POA: Diagnosis present

## 2020-06-18 DIAGNOSIS — Z794 Long term (current) use of insulin: Secondary | ICD-10-CM

## 2020-06-18 DIAGNOSIS — I6782 Cerebral ischemia: Secondary | ICD-10-CM | POA: Diagnosis not present

## 2020-06-18 DIAGNOSIS — Z951 Presence of aortocoronary bypass graft: Secondary | ICD-10-CM

## 2020-06-18 DIAGNOSIS — E1122 Type 2 diabetes mellitus with diabetic chronic kidney disease: Secondary | ICD-10-CM | POA: Diagnosis present

## 2020-06-18 DIAGNOSIS — R404 Transient alteration of awareness: Secondary | ICD-10-CM | POA: Diagnosis not present

## 2020-06-18 DIAGNOSIS — I255 Ischemic cardiomyopathy: Principal | ICD-10-CM | POA: Diagnosis present

## 2020-06-18 DIAGNOSIS — E785 Hyperlipidemia, unspecified: Secondary | ICD-10-CM | POA: Diagnosis present

## 2020-06-18 DIAGNOSIS — Z8673 Personal history of transient ischemic attack (TIA), and cerebral infarction without residual deficits: Secondary | ICD-10-CM

## 2020-06-18 DIAGNOSIS — Z79899 Other long term (current) drug therapy: Secondary | ICD-10-CM

## 2020-06-18 DIAGNOSIS — K219 Gastro-esophageal reflux disease without esophagitis: Secondary | ICD-10-CM | POA: Diagnosis present

## 2020-06-18 DIAGNOSIS — I469 Cardiac arrest, cause unspecified: Secondary | ICD-10-CM | POA: Diagnosis not present

## 2020-06-18 DIAGNOSIS — I672 Cerebral atherosclerosis: Secondary | ICD-10-CM | POA: Diagnosis not present

## 2020-06-18 DIAGNOSIS — G928 Other toxic encephalopathy: Secondary | ICD-10-CM | POA: Diagnosis present

## 2020-06-18 DIAGNOSIS — J90 Pleural effusion, not elsewhere classified: Secondary | ICD-10-CM | POA: Diagnosis not present

## 2020-06-18 DIAGNOSIS — G319 Degenerative disease of nervous system, unspecified: Secondary | ICD-10-CM | POA: Diagnosis not present

## 2020-06-18 DIAGNOSIS — R570 Cardiogenic shock: Secondary | ICD-10-CM | POA: Diagnosis present

## 2020-06-18 DIAGNOSIS — E1165 Type 2 diabetes mellitus with hyperglycemia: Secondary | ICD-10-CM | POA: Diagnosis not present

## 2020-06-18 DIAGNOSIS — I110 Hypertensive heart disease with heart failure: Secondary | ICD-10-CM | POA: Diagnosis not present

## 2020-06-18 DIAGNOSIS — G939 Disorder of brain, unspecified: Secondary | ICD-10-CM | POA: Diagnosis not present

## 2020-06-18 LAB — BASIC METABOLIC PANEL
Anion gap: 20 — ABNORMAL HIGH (ref 5–15)
BUN: 48 mg/dL — ABNORMAL HIGH (ref 8–23)
CO2: 28 mmol/L (ref 22–32)
Calcium: 8.8 mg/dL — ABNORMAL LOW (ref 8.9–10.3)
Chloride: 93 mmol/L — ABNORMAL LOW (ref 98–111)
Creatinine, Ser: 3.31 mg/dL — ABNORMAL HIGH (ref 0.61–1.24)
GFR, Estimated: 17 mL/min — ABNORMAL LOW (ref >60)
Glucose, Bld: 389 mg/dL — ABNORMAL HIGH (ref 70–99)
Potassium: 4.4 mmol/L (ref 3.5–5.1)
Sodium: 141 mmol/L (ref 135–145)

## 2020-06-18 LAB — GLUCOSE, CAPILLARY
Glucose-Capillary: 238 mg/dL — ABNORMAL HIGH (ref 70–99)
Glucose-Capillary: 292 mg/dL — ABNORMAL HIGH (ref 70–99)
Glucose-Capillary: 213 mg/dL — ABNORMAL HIGH (ref 70–99)

## 2020-06-18 LAB — CBC
HCT: 39.5 % (ref 39.0–52.0)
Hemoglobin: 11.4 g/dL — ABNORMAL LOW (ref 13.0–17.0)
MCH: 30.2 pg (ref 26.0–34.0)
MCHC: 28.9 g/dL — ABNORMAL LOW (ref 30.0–36.0)
MCV: 104.5 fL — ABNORMAL HIGH (ref 80.0–100.0)
Platelets: 290 10*3/uL (ref 150–400)
RBC: 3.78 MIL/uL — ABNORMAL LOW (ref 4.22–5.81)
RDW: 14.7 % (ref 11.5–15.5)
WBC: 13.3 10*3/uL — ABNORMAL HIGH (ref 4.0–10.5)
nRBC: 0 % (ref 0.0–0.2)

## 2020-06-18 LAB — MRSA PCR SCREENING: MRSA by PCR: NEGATIVE

## 2020-06-18 LAB — TROPONIN I (HIGH SENSITIVITY)
Troponin I (High Sensitivity): 190 ng/L (ref <18)
Troponin I (High Sensitivity): 395 ng/L (ref <18)

## 2020-06-18 LAB — RESP PANEL BY RT PCR (RSV, FLU A&B, COVID)
Influenza A by PCR: NEGATIVE
Influenza B by PCR: NEGATIVE
Respiratory Syncytial Virus by PCR: NEGATIVE
SARS Coronavirus 2 by RT PCR: NEGATIVE

## 2020-06-18 LAB — CREATININE, SERUM
Creatinine, Ser: 3.12 mg/dL — ABNORMAL HIGH (ref 0.61–1.24)
GFR, Estimated: 18 mL/min — ABNORMAL LOW (ref >60)

## 2020-06-18 MED ORDER — PHENYLEPHRINE HCL-NACL 10-0.9 MG/250ML-% IV SOLN
0.0000 ug/min | INTRAVENOUS | Status: DC
  Administered 2020-06-18: 65 ug/min via INTRAVENOUS
  Administered 2020-06-18: 70 ug/min via INTRAVENOUS
  Administered 2020-06-19: 50 ug/min via INTRAVENOUS
  Administered 2020-06-19 (×2): 240 ug/min via INTRAVENOUS
  Administered 2020-06-19: 210 ug/min via INTRAVENOUS
  Administered 2020-06-19: 240 ug/min via INTRAVENOUS
  Administered 2020-06-19: 210 ug/min via INTRAVENOUS
  Administered 2020-06-19: 230 ug/min via INTRAVENOUS
  Administered 2020-06-19: 240 ug/min via INTRAVENOUS
  Administered 2020-06-19: 180 ug/min via INTRAVENOUS
  Administered 2020-06-19: 260 ug/min via INTRAVENOUS
  Administered 2020-06-19 (×2): 240 ug/min via INTRAVENOUS
  Filled 2020-06-18 (×16): qty 250

## 2020-06-18 MED ORDER — ENOXAPARIN SODIUM 30 MG/0.3ML ~~LOC~~ SOLN
30.0000 mg | SUBCUTANEOUS | Status: DC
  Administered 2020-06-18 – 2020-06-19 (×2): 30 mg via SUBCUTANEOUS
  Filled 2020-06-18 (×2): qty 0.3

## 2020-06-18 MED ORDER — SODIUM CHLORIDE 0.9% FLUSH: 3.0000 mL | INTRAVENOUS | Status: DC | PRN

## 2020-06-18 MED ORDER — SODIUM CHLORIDE 0.9 % IV SOLN
250.0000 mL | INTRAVENOUS | Status: DC
  Administered 2020-06-18: 250 mL via INTRAVENOUS

## 2020-06-18 MED ORDER — SODIUM CHLORIDE 0.9% FLUSH
3.0000 mL | Freq: Two times a day (BID) | INTRAVENOUS | Status: DC
  Administered 2020-06-18 – 2020-06-19 (×4): 3 mL via INTRAVENOUS

## 2020-06-18 MED ORDER — ACETAMINOPHEN 325 MG PO TABS
650.0000 mg | ORAL_TABLET | ORAL | Status: DC | PRN
  Administered 2020-06-18: 650 mg via ORAL
  Filled 2020-06-18: qty 2

## 2020-06-18 MED ORDER — FAMOTIDINE IN NACL 20-0.9 MG/50ML-% IV SOLN: 20.0000 mg | Freq: Two times a day (BID) | INTRAVENOUS | Status: DC

## 2020-06-18 MED ORDER — POLYETHYLENE GLYCOL 3350 17 G PO PACK: 17.0000 g | PACK | Freq: Every day | ORAL | Status: DC | PRN

## 2020-06-18 MED ORDER — DOCUSATE SODIUM 100 MG PO CAPS: 100.0000 mg | ORAL_CAPSULE | Freq: Two times a day (BID) | ORAL | Status: DC | PRN

## 2020-06-18 MED ORDER — PHENYLEPHRINE HCL-NACL 10-0.9 MG/250ML-% IV SOLN
25.0000 ug/min | INTRAVENOUS | Status: DC
  Administered 2020-06-18: 100 ug/min via INTRAVENOUS
  Administered 2020-06-18: 25 ug/min via INTRAVENOUS
  Administered 2020-06-18: 80 ug/min via INTRAVENOUS
  Filled 2020-06-18 (×3): qty 250

## 2020-06-18 MED ORDER — CHLORHEXIDINE GLUCONATE CLOTH 2 % EX PADS
6.0000 | MEDICATED_PAD | Freq: Every day | CUTANEOUS | Status: DC
  Administered 2020-06-18: 6 via TOPICAL

## 2020-06-18 MED ORDER — PROPOFOL 1000 MG/100ML IV EMUL: 5.0000 ug/kg/min | INTRAVENOUS | Status: DC

## 2020-06-18 MED ORDER — PHENYLEPHRINE HCL-NACL 10-0.9 MG/250ML-% IV SOLN: 25.0000 ug/min | INTRAVENOUS | Status: DC

## 2020-06-18 MED ORDER — DOPAMINE-DEXTROSE 3.2-5 MG/ML-% IV SOLN
0.0000 ug/kg/min | INTRAVENOUS | Status: DC
  Administered 2020-06-18: 5 ug/kg/min via INTRAVENOUS

## 2020-06-18 MED ORDER — FAMOTIDINE IN NACL 20-0.9 MG/50ML-% IV SOLN
20.0000 mg | INTRAVENOUS | Status: DC
  Administered 2020-06-19 – 2020-06-20 (×2): 20 mg via INTRAVENOUS
  Filled 2020-06-18 (×2): qty 50

## 2020-06-18 MED ORDER — NOREPINEPHRINE 4 MG/250ML-% IV SOLN
INTRAVENOUS | Status: AC | PRN
  Administered 2020-06-18: 10 ug/min via INTRAVENOUS

## 2020-06-18 MED ORDER — FAMOTIDINE IN NACL 20-0.9 MG/50ML-% IV SOLN
20.0000 mg | Freq: Two times a day (BID) | INTRAVENOUS | Status: DC
  Administered 2020-06-18: 20 mg via INTRAVENOUS
  Filled 2020-06-18: qty 50

## 2020-06-18 MED ORDER — ONDANSETRON HCL 4 MG/2ML IJ SOLN: 4.0000 mg | Freq: Four times a day (QID) | INTRAMUSCULAR | Status: DC | PRN

## 2020-06-18 MED ORDER — PROPOFOL 1000 MG/100ML IV EMUL
INTRAVENOUS | Status: AC
  Filled 2020-06-18: qty 100

## 2020-06-18 MED ORDER — DOPAMINE-DEXTROSE 3.2-5 MG/ML-% IV SOLN
0.0000 ug/kg/min | INTRAVENOUS | Status: DC
  Administered 2020-06-18: 10 ug/kg/min via INTRAVENOUS
  Filled 2020-06-18: qty 250

## 2020-06-18 MED ORDER — ENOXAPARIN SODIUM 40 MG/0.4ML ~~LOC~~ SOLN: 40.0000 mg | SUBCUTANEOUS | Status: DC

## 2020-06-18 MED ORDER — SODIUM CHLORIDE 0.9 % IV SOLN
INTRAVENOUS | Status: AC | PRN
  Administered 2020-06-18: 1000 mL via INTRAVENOUS

## 2020-06-18 MED ORDER — SODIUM CHLORIDE 0.9 % IV SOLN: 250.0000 mL | INTRAVENOUS | Status: DC | PRN

## 2020-06-18 NOTE — Progress Notes (Signed)
GOALS OF CARE DISCUSSION  The Clinical status was relayed to family in detail.  Updated and notified of patients medical condition.  Patient remains unresponsive and will not open eyes to command.   Upon assessment his breath sounds are course crackles with significant secretions to oral pharyngeal region.  Nasopharyngeal suction produced copious sanguineous secretions.    patient with increased WOB and using accessory muscles to breathe Explained to family course of therapy and the modalities     Patient with Progressive multiorgan failure with very low chance of meaningful recovery despite all aggressive and optimal medical therapy. Patient is in the Dying  Process associated with Suffering.  Family understands the situation.  They have consented and agreed to DNR/DNI    Family are satisfied with Plan of action and management. All questions answered  Additional CC time 32 mins   [place]bottom Patricia Pesa, M.D.  [facility]  Medical Director [place] Director [facility] Cardio-Pulmonary Department

## 2020-06-18 NOTE — ED Triage Notes (Signed)
Pt found with "weird breathing" this morning by facility staff. Staff returned to patient 20 minutes later and patient was found pulseless, hx of afib and recent CABG. Rosc was achieved with EMS after CPR and 2 epis, pulses were lost again and ROSC was achieved again after CPR and 1 epi. Pt was in PEA in both arrests. Now in a-fib with a pulse on arrival.

## 2020-06-18 NOTE — Progress Notes (Signed)
[facility] followed up on referral from overnight chaplain to support pt.'s family in ED; when [facility] arrived, pt.'s three dtrs. and son-in-law present in room praying around bed; pt. intubated.  [place] spoke w/family --> learned pt. has been at [facility] recovering from a triple bypass procedure and infection in his legs connected to this procedure for the past several weeks; he was just discharged yesterday to [facility] and was found unresponsive this AM --> coded @ [facility] and in ambulance.  Shortly after [facility] arrived pt. taken to CT on the way to transfer to ICU; [place] prayed briefly for pt. and family and then brought family to ICU waiting room.  Five total family present in waiting room, aware that policy is one visitor but waiting to consult w/ICU MD to see if this is EOL situation.  [place] made RN, ICU secretary, ICU MD aware of family's presence; [place] remains available to support family if needed and plans to check in this PM.

## 2020-06-18 NOTE — ED Provider Notes (Signed)
[facility] Emergency Department Provider Note  ____________________________________________   First MD Initiated Contact with Patient 06/11/2020 [phone]     (approximate)  I have reviewed the triage vital signs and the nursing notes.  Level 5 caveat history review of system limited secondary to cardiac arrest HISTORY  Chief Complaint Cardiac Arrest    HPI Michael Wright is a 79 y.o. male of his medical conditions including chronic kidney disease hypertension hyperlipidemia dementia and recent coronary artery bypass graft x3 vessels on 05/13/2020 at [facility] by Dr. Silvio Pate   presents to the emergency department via EMS secondary to cardiac arrest.  Per EMS patient recently arrived to the nursing facility yesterday.  When the med tech went to check on the patient at 5:15AM this morning the patient was noted to be unresponsive.  On EMS arrival patient was noted to be in pulseless electrical activity on the monitor.  Patient received 2 doses of epinephrine with return of  circulation.  However while in route to the emergency department EMS lost pulse again with the patient becoming asystolic.  Patient was given an additional dose of epinephrine while CPR was being performed with return of pulses before arrival to the emergency department.  On arrival patient with palpable pulse, blood pressure 105/89        Past Medical History:  Diagnosis Date  . Anemia   . Arthritis    shoulders, knees, lower spine  . BPH (benign prostatic hyperplasia)   . Chronic diastolic (congestive) heart failure ([place]) 04/19/2018  . Chronic kidney disease (CKD), stage III (moderate)   . Cough   . Dementia ([place])   . Depression   . Diabetes mellitus, type 2 ([place])   . GERD (gastroesophageal reflux disease)   . Gout   . Hyperlipidemia   . Hypertension   . Kidney stones   . Neuropathy    feet  . Neutrophilia   . Sleep apnea    C-PAP, history of OXYGEN USE AT NIGHT    . TIA (transient ischemic attack) 2016   "several"    Patient Active Problem List   Diagnosis Date Noted  . Acute combined systolic and diastolic heart failure ([place]) 04/16/2020  . Acute respiratory failure with hypoxia and hypercarbia ([place]) 07/25/2018  . Acute pulmonary edema (HCC)   . Acute respiratory failure with hypoxia and hypercapnia ([place]) 04/19/2018  . AKI (acute kidney injury) ([place]) 05/15/2015  . Stroke ([place]) 05/15/2015  . Type 2 diabetes mellitus ([place]) 05/15/2015  . HTN (hypertension) 05/15/2015  . HLD (hyperlipidemia) 05/15/2015  . Abdominal pain, right upper quadrant 05/15/2015  . Hyperlipidemia   . Hypertension   . Anemia   . Diabetes mellitus, type 2 ([place])   . Extrinsic asthma, unspecified     Past Surgical History:  Procedure Laterality Date  . ADENOIDECTOMY  at age 39  . CATARACT EXTRACTION    . CATARACT EXTRACTION W/PHACO Left 05/26/2019   Procedure: CATARACT EXTRACTION PHACO AND INTRAOCULAR LENS PLACEMENT ([place]) LEFT;  Surgeon: Eulogio Bear, MD;  Location: [facility] ORS;  Service: Ophthalmology;  Laterality: Left;  [place] 01:34.8 AP% 17.83 CDE 35.4 Fluid Pack Lot # [id number]  . RIGHT/LEFT HEART CATH AND CORONARY ANGIOGRAPHY N/A 04/24/2020   Procedure: RIGHT/LEFT HEART CATH AND CORONARY ANGIOGRAPHY;  Surgeon: Corey Skains, MD;  Location: [facility];  Service: Cardiovascular;  Laterality: N/A;  . SHOULDER SURGERY Right 1990'S  . TONSILLECTOMY    . VESICOSTOMY  Prior to Admission medications   Medication Sig Start Date End Date Taking? Authorizing Provider  acetaminophen (TYLENOL) 325 MG tablet Take 650 mg by mouth every 6 (six) hours as needed for mild pain, fever or headache.   Yes [provider]  amiodarone (PACERONE) 200 MG tablet Take 200 mg by mouth daily.   Yes [provider]  apixaban (ELIQUIS) 5 MG TABS tablet Take 5 mg by mouth 2 (two) times daily.   Yes [provider]  aspirin EC 81 MG tablet Take 81 mg by  mouth daily.   Yes [provider]  atorvastatin (LIPITOR) 40 MG tablet Take 40 mg by mouth at bedtime.   Yes [provider]  cyanocobalamin 1000 MCG tablet Take 3,000 mcg by mouth daily.   Yes [provider]  donepezil (ARICEPT) 10 MG tablet Take 10 mg by mouth at bedtime.   Yes [provider]  fluticasone (FLONASE) 50 MCG/ACT nasal spray Place 2 sprays into both nostrils daily.   Yes [provider]  furosemide (LASIX) 40 MG tablet Take 40 mg by mouth 2 (two) times daily.    Yes [provider]  guaifenesin (ROBITUSSIN) 100 MG/5ML syrup Take 200 mg by mouth every 6 (six) hours as needed for cough. 06/17/20 07/01/20 Yes [provider]  insulin glargine (LANTUS) 100 unit/mL SOPN Inject 0.36 mLs (36 Units total) into the skin at bedtime. Patient taking differently: Inject 8 Units into the skin at bedtime.  04/26/18  Yes Gladstone Lighter, MD  insulin lispro (HUMALOG) 100 UNIT/ML injection Inject 10-14 Units into the skin 3 (three) times daily. 10 units at 0730 14 units at 1230 12 units at 1800   Yes [provider]  insulin lispro (HUMALOG) 100 UNIT/ML injection Inject 0-6 Units into the skin 3 (three) times daily. Given at 0630, 1230, 1730 If blood sugar is less than 60, call MD. 61-200 give 0 units 201-250 give 2 units 251-300 give 3 units 301-350 give 4 units 351-400 give 5 units If blood sugar is greater than 400 give 6 units and call MD.   Yes [provider]  Ipratropium-Albuterol (COMBIVENT RESPIMAT) 20-100 MCG/ACT AERS respimat Inhale 1 puff into the lungs every 6 (six) hours as needed for wheezing.   Yes [provider]  loratadine (CLARITIN) 10 MG tablet Take 10 mg by mouth daily.   Yes [provider]  magnesium oxide (MAG-OX) 400 MG tablet Take 400 mg by mouth 2 (two) times daily.   Yes [provider]  melatonin 3 MG TABS tablet Take 6 mg by mouth at bedtime.   Yes  [provider]  metoprolol tartrate (LOPRESSOR) 50 MG tablet Take 50 mg by mouth 2 (two) times daily.   Yes [provider]  pantoprazole (PROTONIX) 40 MG tablet Take 1 tablet (40 mg total) by mouth daily. Patient taking differently: Take 40 mg by mouth 2 (two) times daily.  04/26/18  Yes Gladstone Lighter, MD  polyethylene glycol (MIRALAX / GLYCOLAX) 17 g packet Take 17 g by mouth daily as needed for mild constipation.   Yes [provider]  potassium chloride 20 MEQ TBCR Take 20 mEq by mouth daily. While on lasix Patient taking differently: Take 20 mEq by mouth daily.  04/26/18  Yes Gladstone Lighter, MD  Psyllium (METAMUCIL) 0.36 g CAPS Take 1 capsule by mouth daily.    Yes [provider]  senna-docusate (SENOKOT-S) 8.6-50 MG tablet Take 2 tablets by mouth 2 (two) times  daily as needed for mild constipation.   Yes [provider]  sodium chloride (OCEAN) 0.65 % SOLN nasal spray Place 1 spray into both nostrils daily as needed for congestion.   Yes [provider]  traZODone (DESYREL) 50 MG tablet Take 1 tablet (50 mg total) by mouth at bedtime. 04/26/18  Yes Gladstone Lighter, MD    Allergies Cyclobenzaprine and Tramadol  Family History  Problem Relation Age of Onset  . Allergies Daughter   . Diabetes Father     Social History Social History   Tobacco Use  . Smoking status: Never Smoker  . Smokeless tobacco: Former Systems developer    Types: Secondary school teacher  . Vaping Use: Never used  Substance Use Topics  . Alcohol use: No  . Drug use: No    Review of Systems Constitutional: No fever/chills Eyes: No visual changes. ENT: No sore throat. Cardiovascular: Denies chest pain. Respiratory: Denies shortness of breath. Gastrointestinal: No abdominal pain.  No nausea, no vomiting.  No diarrhea.  No constipation. Genitourinary: Negative for dysuria. Musculoskeletal: Negative for neck pain.  Negative for back pain. Integumentary:  Negative for rash. Neurological: Negative for headaches, focal weakness or numbness.   ____________________________________________   PHYSICAL EXAM:  VITAL SIGNS: ED Triage Vitals  Enc Vitals Group     BP      Pulse      Resp      Temp      Temp src      SpO2      Weight      Height      Head Circumference      Peak Flow      Pain Score      Pain Loc      Pain Edu?      Excl. in [place]?     Constitutional: Unresponsive Eyes: Conjunctivae are normal.  Head: Atraumatic Mouth/Throat: No gross normality mouth or posterior oropharynx Neck: No stridor.  No meningeal signs.   Cardiovascular: Normal rate, regular rhythm. Good peripheral circulation. Grossly normal heart sounds. Respiratory: Bilateral breath sounds noted with bag-valve-mask Gastrointestinal: Soft and nontender. No distention.  Musculoskeletal: No lower extremity tenderness nor edema. No gross deformities of extremities. Neurologic:  Normal speech and language. No gross focal neurologic deficits are appreciated.  Skin:  Skin is warm, dry and intact.   ____________________________________________   LABS (all labs ordered are listed, but only abnormal results are displayed)  Labs Reviewed  BASIC METABOLIC PANEL - Abnormal; Notable for the following components:      Result Value   Chloride 93 (*)    Glucose, Bld 389 (*)    BUN 48 (*)    Creatinine, Ser 3.31 (*)    Calcium 8.8 (*)    GFR, Estimated 17 (*)    Anion gap 20 (*)    All other components within normal limits  CBC - Abnormal; Notable for the following components:   WBC 13.3 (*)    RBC 3.78 (*)    Hemoglobin 11.4 (*)    MCV 104.5 (*)    MCHC 28.9 (*)    All other components within normal limits  GLUCOSE, CAPILLARY - Abnormal; Notable for the following components:   Glucose-Capillary 238 (*)    All other components within normal limits  TROPONIN I (HIGH SENSITIVITY) - Abnormal; Notable for the following components:   Troponin I (High  Sensitivity) 190 (*)    All other components within normal limits  RESP PANEL BY  RT PCR (RSV, FLU A&B, COVID)   ____________________________________________  EKG  ED ECG REPORT I, [place] Jaton Eilers, the attending physician, personally viewed and interpreted this ECG.   Date: 06/23/2020  EKG Time: 6:28 AM  Rate: 70  Rhythm: Normal sinus rhythm  Axis: Normal  Intervals: Prolonged PR  ST&T Change: None  ____________________________________________  RADIOLOGY I, [place] Teegan Brandis, personally viewed and evaluated these images (plain radiographs) as part of my medical decision making, as well as reviewing the written report by the radiologist.  ED MD interpretation: Endotracheal tube tip at 3.4 cm above the carina child respiratory atelectasis in the lung bases.  Official radiology report(s): DG Chest Port 1 View  Result Date: 06/15/2020 CLINICAL DATA:  Cardiac arrest and resuscitation EXAM: PORTABLE CHEST 1 VIEW COMPARISON:  12/28/2019 FINDINGS: Postoperative changes in the mediastinum with sternotomy wires, vascular markers, and skin clips present. Endotracheal tube with tip measuring 3.4 cm above the carina. Enteric tube tip is below the left hemidiaphragm. Shallow inspiration with linear atelectasis in the lung bases. Small left pleural effusion. Cardiac enlargement. IMPRESSION: Appliances appear in satisfactory location. Shallow inspiration with atelectasis in the lung bases and small left pleural effusion. Electronically Signed   By: Lucienne Capers M.D.   On: 06/30/2020 06:46    ____________________________________________     .Critical Care Performed by: Gregor Hams, MD Authorized by: Gregor Hams, MD   Critical care provider statement:    Critical care time (minutes):  60   Critical care time was exclusive of:  Separately billable procedures and treating other patients   Critical care was necessary to treat or prevent imminent or life-threatening  deterioration of the following conditions:  Cardiac failure   Critical care was time spent personally by me on the following activities:  Development of treatment plan with patient or surrogate, discussions with consultants, evaluation of patient's response to treatment, examination of patient, obtaining history from patient or surrogate, ordering and performing treatments and interventions, ordering and review of laboratory studies, ordering and review of radiographic studies, pulse oximetry, re-evaluation of patient's condition and review of old charts Procedure Name: Intubation Date/Time: 06/23/2020 7:37 AM Performed by: Gregor Hams, MD Pre-anesthesia Checklist: Patient identified, Emergency Drugs available, Suction available and Patient being monitored Preoxygenation: Pre-oxygenation with 100% oxygen Induction Type: IV induction and Rapid sequence Laryngoscope Size: Mac and 4 Tube size: 7.5 mm Placement Confirmation: ETT inserted through vocal cords under direct vision,  CO2 detector and Breath sounds checked- equal and bilateral Dental Injury: Teeth and Oropharynx as per pre-operative assessment         ____________________________________________   INITIAL IMPRESSION / MDM / ASSESSMENT AND PLAN / ED COURSE  As part of my medical decision making, I reviewed the following data within the electronic MEDICAL RECORD NUMBER  79 year old male presenting with above-stated history and physical exam secondary to cardiac arrest.  Patient underwent endotracheal intubation on arrival to the emergency department.  Spontaneous circulation remained intact while under my care.  Patient's blood pressure noted to be low and as such Levophed was initiated with improvement of blood pressure with current BP of 101/57.  Laboratory data notable thus far for a BUN of 48 creatinine of 3.31 glucose of 389 troponin of 190.  Patient discussed with Dr. Stoney Bang for hospital admission for further evaluation and  management.  ____________________________________________  FINAL CLINICAL IMPRESSION(S) / ED DIAGNOSES  Final diagnoses:  None     MEDICATIONS GIVEN DURING THIS VISIT:  Medications  propofol (  DIPRIVAN) 1000 MG/100ML infusion (0 mcg/kg/min Intravenous Hold 05/31/2020 0636)  DOPamine (INTROPIN) 800 mg in dextrose 5 % 250 mL (3.2 mg/mL) infusion (0 mcg/kg/min Intravenous Stopped 06/29/2020 0635)  0.9 %  sodium chloride infusion (0 mLs Intravenous Stopping Infusion hung by another clincian 06/03/2020 0721)  norepinephrine (LEVOPHED) 39m in 2532mpremix infusion (20 mcg/min Intravenous Rate/Dose Change 06/26/2020 0645)     ED Discharge Orders    None      *Please note:  Michael Wright was evaluated in Emergency Department on 06/22/2020 for the symptoms described in the history of present illness. He was evaluated in the context of the global COVID-19 pandemic, which necessitated consideration that the patient might be at risk for infection with the SARS-CoV-2 virus that causes COVID-19. Institutional protocols and algorithms that pertain to the evaluation of patients at risk for COVID-19 are in a state of rapid change based on information released by regulatory bodies including the CDC and federal and state organizations. These policies and algorithms were followed during the patient's care in the ED.  Some ED evaluations and interventions may be delayed as a result of limited staffing during and after the pandemic.*  Note:  This document was prepared using Dragon voice recognition software and may include unintentional dictation errors.   BrGregor HamsMD 06/03/2020 [phone]

## 2020-06-18 NOTE — Progress Notes (Addendum)
0942 Patient received from ED via stretcher. Moved to ICU bed. Patients core body temperature low -warming blanket applied. Patient unresponsive to pain and pupils 65mm and fixed bilaterally.  Patient was residing at a SNF and had been discharged from [facility] post CABG yesterday. Misline incision as well as chest tube and lef incisions noted on anterior body. Family reports he had an infection in left leg graft site upon discharge from [place]. Multiple odd looking moles noted on arms and legs noted too. Unstageable pressure wound noted on sacrum and anus with peeling skin in the same area on admission. Left nare NG and ETT both have bloody thick secretions noted. 1200 Patient still unresponsive to all painful stimuli except mouth care. Pupils remain 50mm bilaterally and non reactive. Neosynephrine infusing to increase blood pressure without success. See MAR for titration. Family made patient DNR/DNI after talking with Dr. Mortimer Fries.1830 Patient now has temp even after bair huggar turned off x 2 hours. Given Tylenol 650mg  per NG tube without much results. Patient now reacts a little to mouth care but has no gag or corneal reflexes. Family aware of no improvement.

## 2020-06-18 NOTE — Progress Notes (Addendum)
Inpatient Diabetes Program Recommendations  AACE/ADA: New Consensus Statement on Inpatient Glycemic Control   Target Ranges:  Prepandial:   less than 140 mg/dL      Peak postprandial:   less than 180 mg/dL (1-2 hours)      Critically ill patients:  140 - 180 mg/dL   Results for Michael Wright, Michael Wright (MRN [id number]) as of 06/28/2020 10:32  Ref. Range 06/06/2020 06:39 06/04/2020 09:22  Glucose-Capillary Latest Ref Range: 70 - 99 mg/dL 238 (H) 292 (H)  Results for Michael Wright, Michael Wright (MRN [id number]) as of 06/12/2020 10:32  Ref. Range 06/02/2020 06:23  Glucose Latest Ref Range: 70 - 99 mg/dL 389 (H)   Review of Glycemic Control  Diabetes history: DM2 Outpatient Diabetes medications: Lantus 8 units QHS, Humalog 10 units with breakfast, Humalog 14 units with lunch, Humalog 12 units with supper, plus Humalog 0-6 units TID for correction Current orders for Inpatient glycemic control: None  Inpatient Diabetes Program Recommendations:    Insulin: Please consider using ICU Phase 1 Glycemic Control order set to order CBGs Q4H with Novolog 1-3 units Q4H.  NOTE: Per chart, patient admitted from facility following cardiac arrest. Patient recently inpatient at [facility] and was discharged to facility on 06/13/20. Patient is currently on ventilator. Recommend using ICU Glycemic Control order set Phase 1 for CBG monitoring and Novolog correction. Will follow along.  Thanks, Barnie Alderman, RN, MSN, CDE Diabetes Coordinator Inpatient Diabetes Program [phone] (Team Pager from 8am to 5pm)

## 2020-06-18 NOTE — H&P (Signed)
Name: Michael Wright MRN: [id number] DOB: 10-09-1940     CONSULTATION DATE: 06/19/2020  REFERRING MD :  Owens Shark  CHIEF COMPLAINT:  Cardiac arrest     HISTORY OF PRESENT ILLNESS:   79 y.o. male of his medical conditions including chronic kidney disease hypertension hyperlipidemia dementia and recent coronary artery bypass graft x3 vessels on 05/13/2020 at [facility] - presents to the emergency department via EMS secondary to cardiac arrest.    Per EMS patient recently arrived to the nursing facility yesterday.   check on the patient at 5:15AM this morning the patient was noted to be unresponsive  UNKNOWN DOWNTIME.    On EMS arrival patient was noted to be in pulseless electrical activity on the monitor.  Patient received 2 doses of epinephrine with return of  circulation.  However while in route to the emergency department EMS lost pulse again with the patient becoming asystolic.  Patient was given an additional dose of epinephrine while CPR was being performed with return of pulses before arrival to the emergency department.  On arrival patient with palpable pulse, blood pressure 105/89    PATIENT WITH UNKNOWN DOWNTIME AND 2 CARDIAC ARREST WITH ACLS AND PROLONGED CPR WITH MULTIORGAN FAILURE, PATIENT HAS VERY UNFAVORABLE OUTCOME AND DOES NOT MEET CRITERIA FOR HYPOTHERMIA PROTOCOL  PAST MEDICAL HISTORY :   has a past medical history of Anemia, Arthritis, BPH (benign prostatic hyperplasia), Chronic diastolic (congestive) heart failure ([place]) (04/19/2018), Chronic kidney disease (CKD), stage III (moderate), Cough, Dementia ([place]), Depression, Diabetes mellitus, type 2 ([place]), GERD (gastroesophageal reflux disease), Gout, Hyperlipidemia, Hypertension, Kidney stones, Neuropathy, Neutrophilia, Sleep apnea, and TIA (transient ischemic attack) (2016).  has a past surgical history that includes Tonsillectomy; Adenoidectomy (at age 79); Vesicostomy; Shoulder surgery (Right, 1990'S); Cataract  extraction; Cataract extraction w/PHACO (Left, 05/26/2019); and RIGHT/LEFT HEART CATH AND CORONARY ANGIOGRAPHY (N/A, 04/24/2020). Prior to Admission medications   Medication Sig Start Date End Date Taking? Authorizing Provider  acetaminophen (TYLENOL) 325 MG tablet Take 650 mg by mouth every 6 (six) hours as needed for mild pain, fever or headache.   Yes [provider]  amiodarone (PACERONE) 200 MG tablet Take 200 mg by mouth daily.   Yes [provider]  apixaban (ELIQUIS) 5 MG TABS tablet Take 5 mg by mouth 2 (two) times daily.   Yes [provider]  aspirin EC 81 MG tablet Take 81 mg by mouth daily.   Yes [provider]  atorvastatin (LIPITOR) 40 MG tablet Take 40 mg by mouth at bedtime.   Yes [provider]  cyanocobalamin 1000 MCG tablet Take 3,000 mcg by mouth daily.   Yes [provider]  donepezil (ARICEPT) 10 MG tablet Take 10 mg by mouth at bedtime.   Yes [provider]  fluticasone (FLONASE) 50 MCG/ACT nasal spray Place 2 sprays into both nostrils daily.   Yes [provider]  furosemide (LASIX) 40 MG tablet Take 40 mg by mouth 2 (two) times daily.    Yes [provider]  guaifenesin (ROBITUSSIN) 100 MG/5ML syrup Take 200 mg by mouth every 6 (six) hours as needed for cough. 06/17/20 07/01/20 Yes [provider]  insulin glargine (LANTUS) 100 unit/mL SOPN Inject 0.36 mLs (36 Units total) into the skin at bedtime. Patient taking differently: Inject 8 Units into the skin at bedtime.  04/26/18  Yes Gladstone Lighter, MD  insulin lispro (HUMALOG) 100 UNIT/ML injection Inject 10-14 Units into the skin 3 (three) times daily. 10 units at  0730 14 units at 1230 12 units at 1800   Yes [provider]  insulin lispro (HUMALOG) 100 UNIT/ML injection Inject 0-6 Units into the skin 3 (three) times daily. Given at 0630, 1230, 1730 If blood sugar is less than 60, call MD. 61-200 give 0 units 201-250  give 2 units 251-300 give 3 units 301-350 give 4 units 351-400 give 5 units If blood sugar is greater than 400 give 6 units and call MD.   Yes [provider]  Ipratropium-Albuterol (COMBIVENT RESPIMAT) 20-100 MCG/ACT AERS respimat Inhale 1 puff into the lungs every 6 (six) hours as needed for wheezing.   Yes [provider]  loratadine (CLARITIN) 10 MG tablet Take 10 mg by mouth daily.   Yes [provider]  magnesium oxide (MAG-OX) 400 MG tablet Take 400 mg by mouth 2 (two) times daily.   Yes [provider]  melatonin 3 MG TABS tablet Take 6 mg by mouth at bedtime.   Yes [provider]  metoprolol tartrate (LOPRESSOR) 50 MG tablet Take 50 mg by mouth 2 (two) times daily.   Yes [provider]  pantoprazole (PROTONIX) 40 MG tablet Take 1 tablet (40 mg total) by mouth daily. Patient taking differently: Take 40 mg by mouth 2 (two) times daily.  04/26/18  Yes Gladstone Lighter, MD  polyethylene glycol (MIRALAX / GLYCOLAX) 17 g packet Take 17 g by mouth daily as needed for mild constipation.   Yes [provider]  potassium chloride 20 MEQ TBCR Take 20 mEq by mouth daily. While on lasix Patient taking differently: Take 20 mEq by mouth daily.  04/26/18  Yes Gladstone Lighter, MD  Psyllium (METAMUCIL) 0.36 g CAPS Take 1 capsule by mouth daily.    Yes [provider]  senna-docusate (SENOKOT-S) 8.6-50 MG tablet Take 2 tablets by mouth 2 (two) times daily as needed for mild constipation.   Yes [provider]  sodium chloride (OCEAN) 0.65 % SOLN nasal spray Place 1 spray into both nostrils daily as needed for congestion.   Yes [provider]  traZODone (DESYREL) 50 MG tablet Take 1 tablet (50 mg total) by mouth at bedtime. 04/26/18  Yes Gladstone Lighter, MD   Allergies  Allergen Reactions  . Cyclobenzaprine   . Tramadol Other (See Comments)    Light Headedness, hallucinations     FAMILY HISTORY:  family  history includes Allergies in his daughter; Diabetes in his father. SOCIAL HISTORY:  reports that he has never smoked. He quit smokeless tobacco use about 41 years ago.  His smokeless tobacco use included chew. He reports that he does not drink alcohol and does not use drugs.  REVIEW OF SYSTEMS:   Unable to obtain due to critical illness      Estimated body mass index is 33.42 kg/m as calculated from the following:   Height as of this encounter: 5' 7.01" (1.702 m).   Weight as of this encounter: 96.8 kg.    VITAL SIGNS: Temp:  [94.4 F (34.7 C)-96.3 F (35.7 C)] 96.3 F (35.7 C) (10/19 1000) Pulse Rate:  [63-91] 63 (10/19 1000) Resp:  [9-20] 9 (10/19 1000) BP: (61-120)/(36-69) 120/51 (10/19 1000) SpO2:  [80 %-100 %] 88 % (10/19 1000) FiO2 (%):  [100 %] 100 % (10/19 0942) Weight:  [96.8 kg-100.7 kg] 96.8 kg (10/19 0942)   No intake/output data recorded. No intake/output data recorded.   SpO2: (!) 88 % FiO2 (%): 100 %   Physical Examination:  GENERAL:critically ill  appearing, +resp distress HEAD: Normocephalic, atraumatic.  EYES: Pupils equal, round, reactive to light.  No scleral icterus.  MOUTH: Moist mucosal membrane. NECK: Supple. No JVD.  PULMONARY: +rhonchi, +wheezing CARDIOVASCULAR: S1 and S2. Regular rate and rhythm. No murmurs, rubs, or gallops.  GASTROINTESTINAL: Soft, nontender, -distended.  Positive bowel sounds.  MUSCULOSKELETAL: No swelling, clubbing, or edema.  NEUROLOGIC: obtunded SKIN:intact,warm,dry  MEDICATIONS: I have reviewed all medications and confirmed regimen as documented   CULTURE RESULTS   Recent Results (from the past 240 hour(s))  Resp Panel by RT PCR (RSV, Flu A&B, Covid) - Nasopharyngeal Swab     Status: None   Collection Time: 06/07/2020  6:55 AM   Specimen: Nasopharyngeal Swab  Result Value Ref Range Status   SARS Coronavirus 2 by RT PCR NEGATIVE NEGATIVE Final    Comment: (NOTE) SARS-CoV-2 target nucleic acids are NOT  DETECTED.  The SARS-CoV-2 RNA is generally detectable in upper respiratoy specimens during the acute phase of infection. The lowest concentration of SARS-CoV-2 viral copies this assay can detect is 131 copies/mL. A negative result does not preclude SARS-Cov-2 infection and should not be used as the sole basis for treatment or other patient management decisions. A negative result may occur with  improper specimen collection/handling, submission of specimen other than nasopharyngeal swab, presence of viral mutation(s) within the areas targeted by this assay, and inadequate number of viral copies (<131 copies/mL). A negative result must be combined with clinical observations, patient history, and epidemiological information. The expected result is Negative.  Fact Sheet for Patients:  PinkCheek.be  Fact Sheet for Healthcare Providers:  GravelBags.it  This test is no t yet approved or cleared by the [place] FDA and  has been authorized for detection and/or diagnosis of SARS-CoV-2 by FDA under an Emergency Use Authorization (EUA). This EUA will remain  in effect (meaning this test can be used) for the duration of the COVID-19 declaration under Section 564(b)(1) of the Act, 21 U.S.C. section 360bbb-3(b)(1), unless the authorization is terminated or revoked sooner.     Influenza A by PCR NEGATIVE NEGATIVE Final   Influenza B by PCR NEGATIVE NEGATIVE Final    Comment: (NOTE) The Xpert Xpress SARS-CoV-2/FLU/RSV assay is intended as an aid in  the diagnosis of influenza from Nasopharyngeal swab specimens and  should not be used as a sole basis for treatment. Nasal washings and  aspirates are unacceptable for Xpert Xpress SARS-CoV-2/FLU/RSV  testing.  Fact Sheet for Patients: PinkCheek.be  Fact Sheet for Healthcare Providers: GravelBags.it  This test is not yet  approved or cleared by the [place] FDA and  has been authorized for detection and/or diagnosis of SARS-CoV-2 by  FDA under an Emergency Use Authorization (EUA). This EUA will remain  in effect (meaning this test can be used) for the duration of the  Covid-19 declaration under Section 564(b)(1) of the Act, 21  U.S.C. section 360bbb-3(b)(1), unless the authorization is  terminated or revoked.    Respiratory Syncytial Virus by PCR NEGATIVE NEGATIVE Final    Comment: (NOTE) Fact Sheet for Patients: PinkCheek.be  Fact Sheet for Healthcare Providers: GravelBags.it  This test is not yet approved or cleared by the [place] FDA and  has been authorized for detection and/or diagnosis of SARS-CoV-2 by  FDA under an Emergency Use Authorization (EUA). This EUA will remain  in effect (meaning this test can be used) for the duration of the  COVID-19 declaration under Section 564(b)(1) of the Act, 21 U.S.C.  section 360bbb-3(b)(1),  unless the authorization is terminated or  revoked. Performed at [facility], [address] [place], [place] [address]           IMAGING    CT HEAD WO CONTRAST  Result Date: 06/10/2020 CLINICAL DATA:  Cerebral hemorrhage suspected. EXAM: CT HEAD WITHOUT CONTRAST TECHNIQUE: Contiguous axial images were obtained from the base of the skull through the vertex without intravenous contrast. COMPARISON:  04/19/2018 head CT and prior. FINDINGS: Brain: No acute infarct or intracranial hemorrhage. No mass lesion. No midline shift, ventriculomegaly or extra-axial fluid collection. Cavum septum pellucidum et vergae. Mild diffuse cerebral atrophy with ex vacuo dilatation. Chronic microvascular ischemic changes. Vascular: No hyperdense vessel or unexpected calcification. Bilateral skull base atherosclerotic calcifications. Skull: No acute finding. Sinuses/Orbits: Normal orbits. Clear paranasal sinuses.  No mastoid effusion. Other: None. IMPRESSION: No acute intracranial process. Mild cerebral atrophy and chronic microvascular ischemic changes. Electronically Signed   By: Primitivo Gauze M.D.   On: 06/17/2020 09:22   DG Chest Port 1 View  Result Date: 06/01/2020 CLINICAL DATA:  Cardiac arrest and resuscitation EXAM: PORTABLE CHEST 1 VIEW COMPARISON:  12/28/2019 FINDINGS: Postoperative changes in the mediastinum with sternotomy wires, vascular markers, and skin clips present. Endotracheal tube with tip measuring 3.4 cm above the carina. Enteric tube tip is below the left hemidiaphragm. Shallow inspiration with linear atelectasis in the lung bases. Small left pleural effusion. Cardiac enlargement. IMPRESSION: Appliances appear in satisfactory location. Shallow inspiration with atelectasis in the lung bases and small left pleural effusion. Electronically Signed   By: Lucienne Capers M.D.   On: 06/09/2020 06:46     Nutrition Status:           Indwelling Urinary Catheter continued, requirement due to   Reason to continue Indwelling Urinary Catheter strict Intake/Output monitoring for hemodynamic instability         Ventilator continued, requirement due to severe respiratory failure   Ventilator Sedation RASS 0 to -2      ASSESSMENT AND PLAN SYNOPSIS  79 yo male with acute and severe cardiac arrest due to sudden cardiac death and ischemic cardiomyopathy with severe resp failure with multiorgan failure Patient is actively dying process and high risk for recurrent cardiac arrest and death   Severe ACUTE Hypoxic and Hypercapnic Respiratory Failure -continue Full MV support -continue Bronchodilator Therapy -Wean Fio2 and PEEP as tolerated -VAP/VENT bundle implementation  ACUTE SYSTOLIC CARDIAC FAILURE- -oxygen as needed -Lasix as tolerated -follow up cardiac enzymes as indicated -follow up cardiology recs   Morbid obesity, possible OSA.   Will certainly impact respiratory  mechanics, ventilator weaning Suspect will need to consider additional PEEP   ACUTE KIDNEY INJURY/Renal Failure -continue Foley Catheter-assess need -Avoid nephrotoxic agents -Follow urine output, BMP -Ensure adequate renal perfusion, optimize oxygenation -Renal dose medications     NEUROLOGY Acute toxic metabolic encephalopathy Signs of anoxic brain injury   SHOCK-CARDIOGENIC -use vasopressors to keep MAP>65   CARDIAC ICU monitoring  ID -continue IV abx as prescibed -follow up cultures  GI GI PROPHYLAXIS as indicated  NUTRITIONAL STATUS DIET-->NPO Constipation protocol as indicated   ENDO - will use ICU hypoglycemic\Hyperglycemia protocol if needed    ELECTROLYTES -follow labs as needed -replace as needed -pharmacy consultation and following    DVT/GI PRX ordered and assessed TRANSFUSIONS AS NEEDED MONITOR FSBS I Assessed the need for Labs I Assessed the need for Foley I Assessed the need for Central Venous Line Family Discussion when available I Assessed the need for Mobilization  I made an Assessment of medications to be adjusted accordingly Safety Risk assessment Completed  CASE DISCUSSED IN MULTIDISCIPLINARY ROUNDS WITH ICU TEAM   Critical Care Time devoted to patient care services described in this note is 78 minutes.   Overall, patient is critically ill, prognosis is guarded.  Patient with Multiorgan failure and at high risk for cardiac arrest and death.    Corrin Parker, M.D.  [facility]  Medical Director [place] [facility] Cardio-Pulmonary Department

## 2020-06-18 NOTE — Progress Notes (Signed)
Initial Nutrition Assessment  DOCUMENTATION CODES:   Obesity unspecified  INTERVENTION:  Once patient stable enough for tube feeds recommend: -Vital AF 1.2 Cal at 40 mL/hr (960 mL goal daily volume) -PROSource 90 mL TID per tube -Goal regimen provides 1342 kcal, 138 grams of protein, 778 mL H2O daily  NUTRITION DIAGNOSIS:   Inadequate oral intake related to inability to eat as evidenced by NPO status.  GOAL:   Patient will meet greater than or equal to 90% of their needs  MONITOR:   Vent status, Labs, Weight trends, TF tolerance, Skin, I & O's  REASON FOR ASSESSMENT:   Ventilator    ASSESSMENT:   79 year old male with PMHx of HLD, HTN, DM, BPH, gout, dementia, CKD, hx TIA, arthritis, GERD, CHF, depression, sleep apnea, recent CABG x3 vessels on 05/13/2020 at [facility] admitted after PEA arrest with unknown downtime.   10/19 intubated  Patient is currently intubated on ventilator support MV: 10 L/min Temp (24hrs), Avg:97.5 F (36.4 C), Min:94.4 F (34.7 C), Max:101.8 F (38.8 C)  Medications reviewed and include: dopamine gtt at 10 mcg/kg/min, phenylephrine gtt at 70 mcg/min.  Labs reviewed: CBG 238-292, Chloride 93, BUN 48, Creatinine 3.31, Anion gap 20.  I/O: 15 mL UOP so far today  Enteral Access: 16 Fr. OGT placed 10/19; tip below left hemidiaphragm per chest x-ray 10/19  Discussed with RN and on rounds. Patient too unstable for tube feeds at this time.  NUTRITION - FOCUSED PHYSICAL EXAM:    Most Recent Value  Orbital Region No depletion  Upper Arm Region No depletion  Thoracic and Lumbar Region No depletion  Buccal Region Unable to assess  Temple Region No depletion  Clavicle Bone Region No depletion  Clavicle and Acromion Bone Region No depletion  Scapular Bone Region Unable to assess  Dorsal Hand Mild depletion  Patellar Region Mild depletion  Anterior Thigh Region Mild depletion  Posterior Calf Region Mild depletion  Edema (RD  Assessment) Mild  Hair Reviewed  Eyes Unable to assess  Mouth Unable to assess  Skin Reviewed  Nails Reviewed     Diet Order:   Diet Order            Diet NPO time specified  Diet effective now                EDUCATION NEEDS:   No education needs have been identified at this time  Skin:  Skin Assessment: Skin Integrity Issues: Skin Integrity Issues:: DTI, Stage II DTI: perineum Stage II: anus and sacrum  Last BM:  Unknown  Height:   Ht Readings from Last 1 Encounters:  06/27/2020 5' 7.01" (1.702 m)   Weight:   Wt Readings from Last 1 Encounters:  06/09/2020 96.8 kg   Ideal Body Weight:  67.3 kg  BMI:  Body mass index is 33.42 kg/m.  Estimated Nutritional Needs:   Kcal:  5102-5852 (11-14 kcal/kg)  Protein:  135 grams (2 grams/kg)  Fluid:  1.7 L/day  Jacklynn Barnacle, MS, RD, LDN Pager number available on Amion

## 2020-06-19 ENCOUNTER — Inpatient Hospital Stay: Payer: Medicare HMO

## 2020-06-19 DIAGNOSIS — I469 Cardiac arrest, cause unspecified: Secondary | ICD-10-CM | POA: Diagnosis not present

## 2020-06-19 DIAGNOSIS — G939 Disorder of brain, unspecified: Secondary | ICD-10-CM

## 2020-06-19 DIAGNOSIS — G931 Anoxic brain damage, not elsewhere classified: Secondary | ICD-10-CM

## 2020-06-19 LAB — BASIC METABOLIC PANEL
Anion gap: 13 (ref 5–15)
BUN: 59 mg/dL — ABNORMAL HIGH (ref 8–23)
CO2: 28 mmol/L (ref 22–32)
Calcium: 7.6 mg/dL — ABNORMAL LOW (ref 8.9–10.3)
Chloride: 99 mmol/L (ref 98–111)
Creatinine, Ser: 3.99 mg/dL — ABNORMAL HIGH (ref 0.61–1.24)
GFR, Estimated: 13 mL/min — ABNORMAL LOW (ref >60)
Glucose, Bld: 262 mg/dL — ABNORMAL HIGH (ref 70–99)
Potassium: 6.1 mmol/L — ABNORMAL HIGH (ref 3.5–5.1)
Sodium: 140 mmol/L (ref 135–145)

## 2020-06-19 LAB — BLOOD GAS, ARTERIAL
Acid-base deficit: 0.1 mmol/L (ref 0.0–2.0)
Bicarbonate: 25.4 mmol/L (ref 20.0–28.0)
FIO2: 1
MECHVT: 500 mL
Mechanical Rate: 20
O2 Saturation: 98.2 %
PEEP: 8 cmH2O
Patient temperature: 37
RATE: 20 resp/min
pCO2 arterial: 44 mmHg (ref 32.0–48.0)
pH, Arterial: 7.37 (ref 7.350–7.450)
pO2, Arterial: 111 mmHg — ABNORMAL HIGH (ref 83.0–108.0)

## 2020-06-19 LAB — PHOSPHORUS: Phosphorus: 3.5 mg/dL (ref 2.5–4.6)

## 2020-06-19 LAB — CBC
HCT: 37.6 % — ABNORMAL LOW (ref 39.0–52.0)
Hemoglobin: 11.5 g/dL — ABNORMAL LOW (ref 13.0–17.0)
MCH: 29.8 pg (ref 26.0–34.0)
MCHC: 30.6 g/dL (ref 30.0–36.0)
MCV: 97.4 fL (ref 80.0–100.0)
Platelets: 284 10*3/uL (ref 150–400)
RBC: 3.86 MIL/uL — ABNORMAL LOW (ref 4.22–5.81)
RDW: 15.1 % (ref 11.5–15.5)
WBC: 15.2 10*3/uL — ABNORMAL HIGH (ref 4.0–10.5)
nRBC: 0.1 % (ref 0.0–0.2)

## 2020-06-19 LAB — POTASSIUM: Potassium: 5.9 mmol/L — ABNORMAL HIGH (ref 3.5–5.1)

## 2020-06-19 LAB — MAGNESIUM: Magnesium: 2.2 mg/dL (ref 1.7–2.4)

## 2020-06-19 MED ORDER — ORAL CARE MOUTH RINSE
15.0000 mL | OROMUCOSAL | Status: DC
  Administered 2020-06-19 – 2020-06-20 (×18): 15 mL via OROMUCOSAL

## 2020-06-19 MED ORDER — CHLORHEXIDINE GLUCONATE 0.12% ORAL RINSE (MEDLINE KIT)
15.0000 mL | Freq: Two times a day (BID) | OROMUCOSAL | Status: DC
  Administered 2020-06-19 – 2020-06-20 (×4): 15 mL via OROMUCOSAL

## 2020-06-19 MED ORDER — DEXTROSE 50 % IV SOLN
12.5000 g | Freq: Once | INTRAVENOUS | Status: AC
  Administered 2020-06-19: 12.5 g via INTRAVENOUS
  Filled 2020-06-19: qty 50

## 2020-06-19 MED ORDER — AMIODARONE HCL 200 MG PO TABS
200.0000 mg | ORAL_TABLET | Freq: Every day | ORAL | Status: DC
  Administered 2020-06-19 – 2020-06-20 (×2): 200 mg
  Filled 2020-06-19 (×2): qty 1

## 2020-06-19 MED ORDER — INSULIN ASPART 100 UNIT/ML IV SOLN
10.0000 [IU] | Freq: Once | INTRAVENOUS | Status: AC
  Administered 2020-06-19: 10 [IU] via INTRAVENOUS
  Filled 2020-06-19: qty 0.1

## 2020-06-19 MED ORDER — METOPROLOL TARTRATE 5 MG/5ML IV SOLN
2.5000 mg | Freq: Once | INTRAVENOUS | Status: AC
  Administered 2020-06-19: 2.5 mg via INTRAVENOUS
  Filled 2020-06-19: qty 5

## 2020-06-19 MED ORDER — SODIUM BICARBONATE 8.4 % IV SOLN
50.0000 meq | Freq: Once | INTRAVENOUS | Status: AC
  Administered 2020-06-19: 50 meq via INTRAVENOUS
  Filled 2020-06-19: qty 50

## 2020-06-19 MED ORDER — VASOPRESSIN 20 UNITS/100 ML INFUSION FOR SHOCK
0.0000 [IU]/min | INTRAVENOUS | Status: DC
  Administered 2020-06-19: 0.02 [IU]/min via INTRAVENOUS
  Administered 2020-06-19 (×2): 0.03 [IU]/min via INTRAVENOUS
  Filled 2020-06-19 (×4): qty 100

## 2020-06-19 MED ORDER — SODIUM CHLORIDE 0.9 % IV SOLN
0.0000 ug/min | INTRAVENOUS | Status: DC
  Administered 2020-06-19 (×2): 240 ug/min via INTRAVENOUS
  Administered 2020-06-19: 180 ug/min via INTRAVENOUS
  Administered 2020-06-19: 200 ug/min via INTRAVENOUS
  Administered 2020-06-19: 160 ug/min via INTRAVENOUS
  Administered 2020-06-19: 180 ug/min via INTRAVENOUS
  Administered 2020-06-19: 80 ug/min via INTRAVENOUS
  Administered 2020-06-19: 180 ug/min via INTRAVENOUS
  Administered 2020-06-19: 200 ug/min via INTRAVENOUS
  Administered 2020-06-19: 240 ug/min via INTRAVENOUS
  Administered 2020-06-19: 200 ug/min via INTRAVENOUS
  Administered 2020-06-20: 50 ug/min via INTRAVENOUS
  Administered 2020-06-20: 25 ug/min via INTRAVENOUS
  Filled 2020-06-19: qty 10
  Filled 2020-06-19 (×2): qty 1
  Filled 2020-06-19 (×2): qty 10
  Filled 2020-06-19: qty 1
  Filled 2020-06-19 (×6): qty 10
  Filled 2020-06-19 (×2): qty 1

## 2020-06-19 NOTE — Progress Notes (Signed)
Patients HR up to 160's, Domingo Pulse NP at bedside, Dopamine Dtt off per verbal order. EKG done.Rapid HR did break, patient back to SR in the 80's. Per NP hold off on Lopressor IV unless HR goes back up.

## 2020-06-19 NOTE — Progress Notes (Signed)
CRITICAL CARE NOTE 79 y.o.maleof his medical conditions including chronic kidney disease hypertension hyperlipidemia dementia and recent coronary artery bypass graft x3 vessels on 05/13/2020 at [facility] -presents to the emergency department via EMS secondary to cardiac arrest.   Per EMS patient recently arrived to the nursing facility yesterday.  check on the patient at 5:15AMthis morning the patient was noted to be unresponsive  UNKNOWN DOWNTIME.   On EMS arrival patient was noted to be in pulseless electrical activity on the monitor. Patient received 2 doses of epinephrine with return of circulation. However while in route to the emergency department EMS lost pulse again with the patient becoming asystolic. Patient was given an additional dose of epinephrine while CPR was being performed with return of pulses before arrival to the emergency department. On arrival patient with palpable pulse, blood pressure 105/89  PATIENT WITH UNKNOWN DOWNTIME AND 2 CARDIAC ARREST WITH ACLS AND PROLONGED CPR WITH MULTIORGAN FAILURE, PATIENT HAS VERY UNFAVORABLE OUTCOME AND DOES NOT MEET CRITERIA FOR HYPOTHERMIA PROTOCOL  10/19 severe multiorgan failure, patient made DNR by family 10/20 severe multiorgan failure, cardiac failure  CC  follow up respiratory failure  SUBJECTIVE Patient remains critically ill Prognosis is guarded   BP (!) 157/94   Pulse 74   Temp 97.9 F (36.6 C)   Resp 20   Ht 5' 7.01" (1.702 m)   Wt 101.9 kg   SpO2 95%   BMI 35.18 kg/m    I/O last 3 completed shifts: In: 3071 [I.V.:3071] Out: 430 [Urine:130; Emesis/NG output:300] No intake/output data recorded.  SpO2: 95 % FiO2 (%): (S) 90 %  Estimated body mass index is 35.18 kg/m as calculated from the following:   Height as of this encounter: 5' 7.01" (1.702 m).   Weight as of this encounter: 101.9 kg.  SIGNIFICANT EVENTS   REVIEW OF SYSTEMS  PATIENT IS UNABLE TO PROVIDE COMPLETE REVIEW OF  SYSTEMS DUE TO SEVERE CRITICAL ILLNESS   Pressure Injury 06/27/2020 Perineum Mid;Lower Deep Tissue Pressure Injury - Purple or maroon localized area of discolored intact skin or blood-filled blister due to damage of underlying soft tissue from pressure and/or shear. and stage 2 anal area and sacr (Active)  06/14/2020 0923  Location: Perineum  Location Orientation: Mid;Lower  Staging: Deep Tissue Pressure Injury - Purple or maroon localized area of discolored intact skin or blood-filled blister due to damage of underlying soft tissue from pressure and/or shear.  Wound Description (Comments): and stage 2 anal area and sacral area with peeling skin present on admission  Present on Admission: Yes      PHYSICAL EXAMINATION:  GENERAL:critically ill appearing, +resp distress HEAD: Normocephalic, atraumatic.  EYES: Pupils equal, round, reactive to light.  No scleral icterus.  MOUTH: Moist mucosal membrane. NECK: Supple.  PULMONARY: +rhonchi, +wheezing CARDIOVASCULAR: S1 and S2. Regular rate and rhythm. No murmurs, rubs, or gallops.  GASTROINTESTINAL: Soft, nontender, -distended.  Positive bowel sounds.   MUSCULOSKELETAL: No swelling, clubbing, or edema.  NEUROLOGIC: obtunded, GCS<8 SKIN:intact,warm,dry  MEDICATIONS: I have reviewed all medications and confirmed regimen as documented   CULTURE RESULTS   Recent Results (from the past 240 hour(s))  Resp Panel by RT PCR (RSV, Flu A&B, Covid) - Nasopharyngeal Swab     Status: None   Collection Time: 06/04/2020  6:55 AM   Specimen: Nasopharyngeal Swab  Result Value Ref Range Status   SARS Coronavirus 2 by RT PCR NEGATIVE NEGATIVE Final    Comment: (NOTE) SARS-CoV-2 target nucleic acids are NOT DETECTED.  The SARS-CoV-2 RNA is generally detectable in upper respiratoy specimens during the acute phase of infection. The lowest concentration of SARS-CoV-2 viral copies this assay can detect is 131 copies/mL. A negative result does not preclude  SARS-Cov-2 infection and should not be used as the sole basis for treatment or other patient management decisions. A negative result may occur with  improper specimen collection/handling, submission of specimen other than nasopharyngeal swab, presence of viral mutation(s) within the areas targeted by this assay, and inadequate number of viral copies (<131 copies/mL). A negative result must be combined with clinical observations, patient history, and epidemiological information. The expected result is Negative.  Fact Sheet for Patients:  PinkCheek.be  Fact Sheet for Healthcare Providers:  GravelBags.it  This test is no t yet approved or cleared by the [place] FDA and  has been authorized for detection and/or diagnosis of SARS-CoV-2 by FDA under an Emergency Use Authorization (EUA). This EUA will remain  in effect (meaning this test can be used) for the duration of the COVID-19 declaration under Section 564(b)(1) of the Act, 21 U.S.C. section 360bbb-3(b)(1), unless the authorization is terminated or revoked sooner.     Influenza A by PCR NEGATIVE NEGATIVE Final   Influenza B by PCR NEGATIVE NEGATIVE Final    Comment: (NOTE) The Xpert Xpress SARS-CoV-2/FLU/RSV assay is intended as an aid in  the diagnosis of influenza from Nasopharyngeal swab specimens and  should not be used as a sole basis for treatment. Nasal washings and  aspirates are unacceptable for Xpert Xpress SARS-CoV-2/FLU/RSV  testing.  Fact Sheet for Patients: PinkCheek.be  Fact Sheet for Healthcare Providers: GravelBags.it  This test is not yet approved or cleared by the [place] FDA and  has been authorized for detection and/or diagnosis of SARS-CoV-2 by  FDA under an Emergency Use Authorization (EUA). This EUA will remain  in effect (meaning this test can be used) for the duration of the   Covid-19 declaration under Section 564(b)(1) of the Act, 21  U.S.C. section 360bbb-3(b)(1), unless the authorization is  terminated or revoked.    Respiratory Syncytial Virus by PCR NEGATIVE NEGATIVE Final    Comment: (NOTE) Fact Sheet for Patients: PinkCheek.be  Fact Sheet for Healthcare Providers: GravelBags.it  This test is not yet approved or cleared by the [place] FDA and  has been authorized for detection and/or diagnosis of SARS-CoV-2 by  FDA under an Emergency Use Authorization (EUA). This EUA will remain  in effect (meaning this test can be used) for the duration of the  COVID-19 declaration under Section 564(b)(1) of the Act, 21 U.S.C.  section 360bbb-3(b)(1), unless the authorization is terminated or  revoked. Performed at [facility], [address] [place], [place] [address]   MRSA PCR Screening     Status: None   Collection Time: 06/17/2020  9:46 AM   Specimen: Nasopharyngeal  Result Value Ref Range Status   MRSA by PCR NEGATIVE NEGATIVE Final    Comment:        The GeneXpert MRSA Assay (FDA approved for NASAL specimens only), is one component of a comprehensive MRSA colonization surveillance program. It is not intended to diagnose MRSA infection nor to guide or monitor treatment for MRSA infections. Performed at [facility], [address] [place], [place] [address]           IMAGING    CT HEAD WO CONTRAST  Result Date: 06/04/2020 CLINICAL DATA:  Cerebral hemorrhage suspected. EXAM: CT HEAD WITHOUT CONTRAST TECHNIQUE:  Contiguous axial images were obtained from the base of the skull through the vertex without intravenous contrast. COMPARISON:  04/19/2018 head CT and prior. FINDINGS: Brain: No acute infarct or intracranial hemorrhage. No mass lesion. No midline shift, ventriculomegaly or extra-axial fluid collection. Cavum septum pellucidum et vergae. Mild diffuse  cerebral atrophy with ex vacuo dilatation. Chronic microvascular ischemic changes. Vascular: No hyperdense vessel or unexpected calcification. Bilateral skull base atherosclerotic calcifications. Skull: No acute finding. Sinuses/Orbits: Normal orbits. Clear paranasal sinuses. No mastoid effusion. Other: None. IMPRESSION: No acute intracranial process. Mild cerebral atrophy and chronic microvascular ischemic changes. Electronically Signed   By: Primitivo Gauze M.D.   On: 06/09/2020 09:22   DG Chest Port 1 View  Result Date: 06/19/2020 CLINICAL DATA:  Shortness of breath EXAM: PORTABLE CHEST 1 VIEW COMPARISON:  06/30/2020 FINDINGS: Postoperative changes in the mediastinum. Endotracheal and enteric tubes are unchanged in position. Cardiac enlargement. Bilateral pleural effusions, greater on the left. Perihilar infiltrates, likely representing edema. Infiltrates and effusions are progressing since previous study. IMPRESSION: Cardiac enlargement with bilateral pleural effusions and perihilar edema, progressing since previous study. Electronically Signed   By: Lucienne Capers M.D.   On: 06/19/2020 05:22     Nutrition Status: Nutrition Problem: Inadequate oral intake Etiology: inability to eat Signs/Symptoms: NPO status Interventions: Tube feeding, Prostat, MVI     Indwelling Urinary Catheter continued, requirement due to   Reason to continue Indwelling Urinary Catheter strict Intake/Output monitoring for hemodynamic instability         Ventilator continued, requirement due to severe respiratory failure   Ventilator Sedation RASS 0 to -2      ASSESSMENT AND PLAN SYNOPSIS  79 yo male with acute and severe cardiac arrest due to sudden cardiac death and ischemic cardiomyopathy with severe resp failure with multiorgan failure Patient is actively dying process and high risk for recurrent cardiac arrest and death   Severe ACUTE Hypoxic and Hypercapnic Respiratory Failure -continue  Full MV support -continue Bronchodilator Therapy -Wean Fio2 and PEEP as tolerated -VAP/VENT bundle implementation  ACUTE SYSTOLIC CARDIAC FAILURE-  -oxygen as needed -Lasix as tolerated  Morbid obesity, possible OSA.   Will certainly impact respiratory mechanics, ventilator weaning Suspect will need to consider additional PEEP   ACUTE KIDNEY INJURY/Renal Failure -continue Foley Catheter-assess need -Avoid nephrotoxic agents -Follow urine output, BMP -Ensure adequate renal perfusion, optimize oxygenation -Renal dose medications Patient not a candidate for HD as family has agreed not to add hemodialysis     NEUROLOGY Acute toxic metabolic encephalopathy, need for sedation Goal RASS -2 to -3  SHOCK-CARDIOGENIC -use vasopressors to keep MAP>65  CARDIAC ICU monitoring  ID -continue IV abx as prescibed -follow up cultures  GI GI PROPHYLAXIS as indicated  NUTRITIONAL STATUS Nutrition Status: Nutrition Problem: Inadequate oral intake Etiology: inability to eat Signs/Symptoms: NPO status Interventions: Tube feeding, Prostat, MVI    ENDO - will use ICU hypoglycemic\Hyperglycemia protocol if indicated     ELECTROLYTES -follow labs as needed -replace as needed -pharmacy consultation and following   DVT/GI PRX ordered and assessed TRANSFUSIONS AS NEEDED MONITOR FSBS I Assessed the need for Labs I Assessed the need for Foley I Assessed the need for Central Venous Line Family Discussion when available I Assessed the need for Mobilization I made an Assessment of medications to be adjusted accordingly Safety Risk assessment completed   CASE DISCUSSED IN MULTIDISCIPLINARY ROUNDS WITH ICU TEAM  Critical Care Time devoted to patient care services described in this note is 75 minutes.  Overall, patient is critically ill, prognosis is guarded.  Patient with Multiorgan failure and at high risk for cardiac arrest and death.   Patient is DNR status  Corrin Parker, M.D.  [facility]  Medical Director [place] Director [facility] Cardio-Pulmonary Department

## 2020-06-19 NOTE — Progress Notes (Signed)
HR back up to 140's, SBP down to 40's with increased HR. Titrating Neo back up. Domingo Pulse NP aware. If SBP BP stays up in 90's give Lopressor 2.5mg .

## 2020-06-19 NOTE — Progress Notes (Signed)
Patients SBP down to the 50's, titrating Neo dtt up with little effect. Patient HR in 70's. Call to [facility] NP to make aware.

## 2020-06-19 NOTE — Progress Notes (Signed)
GOALS OF CARE DISCUSSION  The Clinical status was relayed to family in detail daughters at bedside.  Updated and notified of patients medical condition.  Patient remains unresponsive and will not open eyes to command.    patient with increased WOB and using accessory muscles to breathe Explained to family course of therapy and the modalities     Patient with Progressive multiorgan failure with very low chance of meaningful recovery despite all aggressive and optimal medical therapy. Patient is in the Dying  Process associated with Suffering.  Family understands the situation.  They have consented and agreed to DNR/DNI.  Family are satisfied with Plan of action and management. All questions answered  Additional CC time 32 mins   Rashad Obeid Patricia Pesa, M.D.  [facility]  Medical Director [place] Director [facility] Cardio-Pulmonary Department

## 2020-06-19 NOTE — Progress Notes (Signed)
Patient developed a wide QRS tachycardia in the 160's.  BP stable, Dopamine drip was discontinued.  Tachycardia was self-sustaining with a rate currently in the 70's.  BP stable on phenylephrine drip.  Michael Wright, AGACNP-BC [facility]    Please see Amion for pager details.

## 2020-06-19 NOTE — Progress Notes (Signed)
[place] visited pt.'s rm. per suggestion of RN; pt.'s dtr Michael Wright at bedside talking to pt.  [place] entered rm. and spke w/dtr.  In extended visit, dtr. shared that she has been key caregiver for pt. and her late mother over the last few years and moved back to this area in order to do so when her mother became sick enough to need some extra help.   Pt.'s wife was an invalid much of the last few years, per dtr., and pt. spent most of his time caring for her --> lost time and ability to do the active things he used to enjoy.  In the wake of pt.'s wife's death, dtr. hoped to have the opportunity for pt. to have new experiences but COVID and pt.'s declinign health made this impossible.  Dtr. also hoped to spend time w/pt. in rehab within walking distance from her house after pt.'s discharge from [facility], but pt. coded shortly after arriving at rehab.  Dtr. acknowledges gratitude for time now w/her sisters and w/pt. --> opportunity to say goodbyes.  Dtr. very grateful for chance to  talk w/chaplain; other family expected later today.  CH remains available as needed.

## 2020-06-20 DIAGNOSIS — I469 Cardiac arrest, cause unspecified: Secondary | ICD-10-CM

## 2020-06-20 DIAGNOSIS — G931 Anoxic brain damage, not elsewhere classified: Secondary | ICD-10-CM | POA: Diagnosis not present

## 2020-06-20 LAB — CBC
HCT: 32.8 % — ABNORMAL LOW (ref 39.0–52.0)
Hemoglobin: 10.6 g/dL — ABNORMAL LOW (ref 13.0–17.0)
MCH: 30.6 pg (ref 26.0–34.0)
MCHC: 32.3 g/dL (ref 30.0–36.0)
MCV: 94.8 fL (ref 80.0–100.0)
Platelets: 190 10*3/uL (ref 150–400)
RBC: 3.46 MIL/uL — ABNORMAL LOW (ref 4.22–5.81)
RDW: 15.3 % (ref 11.5–15.5)
WBC: 11.6 10*3/uL — ABNORMAL HIGH (ref 4.0–10.5)
nRBC: 0.3 % — ABNORMAL HIGH (ref 0.0–0.2)

## 2020-06-20 LAB — BASIC METABOLIC PANEL
Anion gap: 12 (ref 5–15)
BUN: 60 mg/dL — ABNORMAL HIGH (ref 8–23)
CO2: 25 mmol/L (ref 22–32)
Calcium: 7.4 mg/dL — ABNORMAL LOW (ref 8.9–10.3)
Chloride: 106 mmol/L (ref 98–111)
Creatinine, Ser: 3.09 mg/dL — ABNORMAL HIGH (ref 0.61–1.24)
GFR, Estimated: 18 mL/min — ABNORMAL LOW (ref >60)
Glucose, Bld: 374 mg/dL — ABNORMAL HIGH (ref 70–99)
Potassium: 4.8 mmol/L (ref 3.5–5.1)
Sodium: 143 mmol/L (ref 135–145)

## 2020-06-20 LAB — PHOSPHORUS: Phosphorus: 2.6 mg/dL (ref 2.5–4.6)

## 2020-06-20 LAB — MAGNESIUM: Magnesium: 2.2 mg/dL (ref 1.7–2.4)

## 2020-06-20 MED ORDER — ACETAMINOPHEN 650 MG RE SUPP: 650.0000 mg | Freq: Four times a day (QID) | RECTAL | Status: DC | PRN

## 2020-06-20 MED ORDER — MORPHINE SULFATE (PF) 2 MG/ML IV SOLN: 2.0000 mg | INTRAVENOUS | Status: DC | PRN

## 2020-06-20 MED ORDER — ACETAMINOPHEN 325 MG PO TABS: 650.0000 mg | ORAL_TABLET | Freq: Four times a day (QID) | ORAL | Status: DC | PRN

## 2020-06-20 MED ORDER — GLYCOPYRROLATE 1 MG PO TABS
1.0000 mg | ORAL_TABLET | ORAL | Status: DC | PRN
  Filled 2020-06-20: qty 1

## 2020-06-20 MED ORDER — GLYCOPYRROLATE 0.2 MG/ML IJ SOLN: 0.2000 mg | INTRAMUSCULAR | Status: DC | PRN

## 2020-06-20 MED ORDER — DIPHENHYDRAMINE HCL 50 MG/ML IJ SOLN: 25.0000 mg | INTRAMUSCULAR | Status: DC | PRN

## 2020-06-20 MED ORDER — POLYVINYL ALCOHOL 1.4 % OP SOLN
1.0000 [drp] | Freq: Four times a day (QID) | OPHTHALMIC | Status: DC | PRN
  Filled 2020-06-20: qty 15

## 2020-06-20 MED ORDER — DEXTROSE 5 % IV SOLN: INTRAVENOUS | Status: DC

## 2020-06-21 ENCOUNTER — Other Ambulatory Visit: Payer: Self-pay

## 2020-06-21 NOTE — Patient Outreach (Signed)
[name] [facility]) Care Management  06/21/2020  Michael Wright May 18, 1941 [id number]   Patient recently hospitalized.  Deceased during hospitalization.   Plan: RN CM will close case.   Jone Baseman, RN, MSN [place] Management Care Management Coordinator Direct Line [phone] Cell [phone] Toll Free: [phone]  Fax: [phone]

## 2020-06-26 DIAGNOSIS — J45909 Unspecified asthma, uncomplicated: Secondary | ICD-10-CM | POA: Diagnosis not present

## 2020-06-26 DIAGNOSIS — J440 Chronic obstructive pulmonary disease with acute lower respiratory infection: Secondary | ICD-10-CM | POA: Diagnosis not present

## 2020-06-26 DIAGNOSIS — R0689 Other abnormalities of breathing: Secondary | ICD-10-CM | POA: Diagnosis not present

## 2020-06-26 DIAGNOSIS — J449 Chronic obstructive pulmonary disease, unspecified: Secondary | ICD-10-CM | POA: Diagnosis not present

## 2020-07-01 NOTE — Progress Notes (Signed)
CRITICAL CARE NOTE 79 y.o.maleof his medical conditions including chronic kidney disease hypertension hyperlipidemia dementia and recent coronary artery bypass graft x3 vessels on 05/13/2020 at [facility] -presents to the emergency department via EMS secondary to cardiac arrest.   Per EMS patient recently arrived to the nursing facility yesterday.  check on the patient at 5:15AMthis morning the patient was noted to be unresponsive  UNKNOWN DOWNTIME.   On EMS arrival patient was noted to be in pulseless electrical activity on the monitor. Patient received 2 doses of epinephrine with return of circulation. However while in route to the emergency department EMS lost pulse again with the patient becoming asystolic. Patient was given an additional dose of epinephrine while CPR was being performed with return of pulses before arrival to the emergency department. On arrival patient with palpable pulse, blood pressure 105/89  PATIENT WITH UNKNOWN DOWNTIME AND 2 CARDIAC ARREST WITH ACLS AND PROLONGED CPR WITH MULTIORGAN FAILURE, PATIENT HAS VERY UNFAVORABLE OUTCOME AND DOES NOT MEET CRITERIA FOR HYPOTHERMIA PROTOCOL  10/19 severe multiorgan failure, patient made DNR by family 10/20 severe multiorgan failure, cardiac failure  CC  Severe brain damage  SUBJECTIVE multiorgan failure Severe brain damage prolonged cardiac arrest    BP (!) 131/59   Pulse 74   Temp 98.2 F (36.8 C)   Resp 20   Ht 5' 7.01" (1.702 m)   Wt 101.2 kg   SpO2 90%   BMI 34.93 kg/m    I/O last 3 completed shifts: In: 7488.9 [I.V.:7188.9; Other:250; IV Piggyback:50] Out: 1135 [Urine:835; Emesis/NG output:300] No intake/output data recorded.  SpO2: 90 % O2 Flow Rate (L/min): 60 L/min FiO2 (%): 60 %  Estimated body mass index is 34.93 kg/m as calculated from the following:   Height as of this encounter: 5' 7.01" (1.702 m).   Weight as of this encounter: 101.2 kg.  SIGNIFICANT  EVENTS   REVIEW OF SYSTEMS  PATIENT IS UNABLE TO PROVIDE COMPLETE REVIEW OF SYSTEMS DUE TO SEVERE CRITICAL ILLNESS   Pressure Injury 06/15/2020 Perineum Mid;Lower Deep Tissue Pressure Injury - Purple or maroon localized area of discolored intact skin or blood-filled blister due to damage of underlying soft tissue from pressure and/or shear. and stage 2 anal area and sacr (Active)  06/27/2020 0923  Location: Perineum  Location Orientation: Mid;Lower  Staging: Deep Tissue Pressure Injury - Purple or maroon localized area of discolored intact skin or blood-filled blister due to damage of underlying soft tissue from pressure and/or shear.  Wound Description (Comments): and stage 2 anal area and sacral area with peeling skin present on admission  Present on Admission: Yes    PHYSICAL EXAMINATION:  GENERAL:critically ill appearing, +resp distress HEAD: Normocephalic, atraumatic.  EYES: Pupils equal, round, reactive to light.  No scleral icterus.  MOUTH: Moist mucosal membrane. NECK: Supple.  PULMONARY: +rhonchi, +wheezing CARDIOVASCULAR: S1 and S2. Regular rate and rhythm. No murmurs, rubs, or gallops.  GASTROINTESTINAL: Soft, nontender, -distended.  Positive bowel sounds.   MUSCULOSKELETAL: No swelling, clubbing, or edema.  NEUROLOGIC: obtunded, GCS<8 SKIN:intact,warm,dry  MEDICATIONS: I have reviewed all medications and confirmed regimen as documented   CULTURE RESULTS   Recent Results (from the past 240 hour(s))  Resp Panel by RT PCR (RSV, Flu A&B, Covid) - Nasopharyngeal Swab     Status: None   Collection Time: 06/17/2020  6:55 AM   Specimen: Nasopharyngeal Swab  Result Value Ref Range Status   SARS Coronavirus 2 by RT PCR NEGATIVE NEGATIVE Final    Comment: (NOTE)  SARS-CoV-2 target nucleic acids are NOT DETECTED.  The SARS-CoV-2 RNA is generally detectable in upper respiratoy specimens during the acute phase of infection. The lowest concentration of SARS-CoV-2 viral copies this  assay can detect is 131 copies/mL. A negative result does not preclude SARS-Cov-2 infection and should not be used as the sole basis for treatment or other patient management decisions. A negative result may occur with  improper specimen collection/handling, submission of specimen other than nasopharyngeal swab, presence of viral mutation(s) within the areas targeted by this assay, and inadequate number of viral copies (<131 copies/mL). A negative result must be combined with clinical observations, patient history, and epidemiological information. The expected result is Negative.  Fact Sheet for Patients:  PinkCheek.be  Fact Sheet for Healthcare Providers:  GravelBags.it  This test is no t yet approved or cleared by the [place] FDA and  has been authorized for detection and/or diagnosis of SARS-CoV-2 by FDA under an Emergency Use Authorization (EUA). This EUA will remain  in effect (meaning this test can be used) for the duration of the COVID-19 declaration under Section 564(b)(1) of the Act, 21 U.S.C. section 360bbb-3(b)(1), unless the authorization is terminated or revoked sooner.     Influenza A by PCR NEGATIVE NEGATIVE Final   Influenza B by PCR NEGATIVE NEGATIVE Final    Comment: (NOTE) The Xpert Xpress SARS-CoV-2/FLU/RSV assay is intended as an aid in  the diagnosis of influenza from Nasopharyngeal swab specimens and  should not be used as a sole basis for treatment. Nasal washings and  aspirates are unacceptable for Xpert Xpress SARS-CoV-2/FLU/RSV  testing.  Fact Sheet for Patients: PinkCheek.be  Fact Sheet for Healthcare Providers: GravelBags.it  This test is not yet approved or cleared by the [place] FDA and  has been authorized for detection and/or diagnosis of SARS-CoV-2 by  FDA under an Emergency Use Authorization (EUA). This EUA will  remain  in effect (meaning this test can be used) for the duration of the  Covid-19 declaration under Section 564(b)(1) of the Act, 21  U.S.C. section 360bbb-3(b)(1), unless the authorization is  terminated or revoked.    Respiratory Syncytial Virus by PCR NEGATIVE NEGATIVE Final    Comment: (NOTE) Fact Sheet for Patients: PinkCheek.be  Fact Sheet for Healthcare Providers: GravelBags.it  This test is not yet approved or cleared by the [place] FDA and  has been authorized for detection and/or diagnosis of SARS-CoV-2 by  FDA under an Emergency Use Authorization (EUA). This EUA will remain  in effect (meaning this test can be used) for the duration of the  COVID-19 declaration under Section 564(b)(1) of the Act, 21 U.S.C.  section 360bbb-3(b)(1), unless the authorization is terminated or  revoked. Performed at [facility], [address] [place], [place] [address]   MRSA PCR Screening     Status: None   Collection Time: 06/26/2020  9:46 AM   Specimen: Nasopharyngeal  Result Value Ref Range Status   MRSA by PCR NEGATIVE NEGATIVE Final    Comment:        The GeneXpert MRSA Assay (FDA approved for NASAL specimens only), is one component of a comprehensive MRSA colonization surveillance program. It is not intended to diagnose MRSA infection nor to guide or monitor treatment for MRSA infections. Performed at [facility], [address] [place],  [address]        Indwelling Urinary Catheter continued, requirement due to   Reason to continue Indwelling Urinary Catheter strict Intake/Output monitoring for  hemodynamic instability         Ventilator continued, requirement due to severe respiratory failure   Ventilator Sedation RASS 0 to -2      ASSESSMENT AND PLAN SYNOPSIS  79 yo male with acute and severe cardiac arrest due to sudden cardiac death and ischemic cardiomyopathy  with severe resp failure with multiorgan failure Patient is actively dying process and high risk for recurrent cardiac arrest and death   Severe ACUTE Hypoxic and Hypercapnic Respiratory Failure -continue Mechanical Ventilator support -continue Bronchodilator Therapy -Wean Fio2 and PEEP as tolerated -VAP/VENT bundle implementation   ACUTE SYSTOLIC CARDIAC FAILURE- -oxygen as needed -Lasix as tolerated -follow up cardiac enzymes as indicated -follow up cardiology recs  Morbid obesity, possible OSA.   Will certainly impact respiratory mechanics, ventilator weaning Suspect will need to consider additional PEEP   ACUTE KIDNEY INJURY/Renal Failure -continue Foley Catheter-assess need -Avoid nephrotoxic agents -Follow urine output, BMP -Ensure adequate renal perfusion, optimize oxygenation -Renal dose medications    NEUROLOGY Acute toxic metabolic encephalopathy-severe brain damage due to anoxia Family wants EEG pending   SHOCK-CARDIOGENIC -use vasopressors to keep MAP>65   ENDO - ICU hypoglycemic\Hyperglycemia protocol -check FSBS per protocol  ELECTROLYTES -follow labs as needed -replace as needed -pharmacy consultation and following   DVT/GI PRX ordered and assessed TRANSFUSIONS AS NEEDED MONITOR FSBS I Assessed the need for Labs I Assessed the need for Foley I Assessed the need for Central Venous Line Family Discussion when available I Assessed the need for Mobilization I made an Assessment of medications to be adjusted accordingly Safety Risk assessment completed  CASE DISCUSSED IN MULTIDISCIPLINARY ROUNDS WITH ICU TEAM   Critical Care Time devoted to patient care services described in this note is 75 minutes.   Overall, patient is critically ill, prognosis is guarded.  Patient with Multiorgan failure and at high risk for cardiac arrest and death.   Patient is DNR  Corrin Parker, M.D.  [facility]  Medical  Director [place] Director [facility] Cardio-Pulmonary Department

## 2020-07-01 NOTE — Death Summary Note (Signed)
DEATH SUMMARY   Patient Details  Name: Michael Wright MRN: [id number] DOB: June 29, 1941  Admission/Discharge Information   Admit Date:  06-21-20  Date of Death:   06-23-20   Time of Death:  657PM  Length of Stay: 2  Referring Physician: [place] Pink, MD   Reason(s) for Hospitalization  ACUTE CARDIAC ARREST  Diagnoses  Preliminary cause of death: ISCHEMIC CARDIOMYOPATHY, ANOXIC BRAIN DAMAGE Secondary Diagnoses (including complications and co-morbidities):  Active Problems:   Cardiac arrest [facility] Course (including significant findings, care, treatment, and services provided and events leading to death)  79 y.o.maleof his medical conditions including chronic kidney disease hypertension hyperlipidemia dementia and recent coronary artery bypass graft x3 vessels on 05/13/2020 at [facility]-presents to the emergency department via EMS secondary to cardiac arrest.   Per EMS patient recently arrived to the nursing facility yesterday.  check on the patient at 5:15AMthis morning the patient was noted to be unresponsive UNKNOWN DOWNTIME.   GOALS OF CARE DISCUSSION  The Clinical status was relayed to family in detail.  Updated and notified of patients medical condition.  Patient remains unresponsive and will not open eyes to command.     Explained to family course of therapy and the modalities     Patient with Progressive multiorgan failure with very low chance of meaningful recovery despite all aggressive and optimal medical therapy. Patient is in the Dying  Process associated with Suffering.  Signs and symptoms of brain damage and brain death  Family understands the situation.  They have consented and agreed to DNR/DNI and would like to proceed with Comfort care measures.  Family are satisfied with Plan of action and management. All questions answered   Patient expired 657PM   Pertinent Labs and Studies   Significant Diagnostic Studies EEG  Result Date: June 23, 2020 Lora Havens, MD     06/23/2020  2:46 PM Patient Name: Michael Wright MRN: [id number] Epilepsy Attending: Lora Havens Referring Physician/Provider:  Dr Flora Lipps Date: 23-Jun-2020 Duration: 31.18 mins Patient history: 79 year old male status post cardiac arrest.  EEG to evaluate for seizures. Level of alertness: Comatose AEDs during EEG study: None Technical aspects: This EEG study was done with scalp electrodes positioned according to the 10-20 International system of electrode placement. Electrical activity was acquired at a sampling rate of 500Hz  and reviewed with a high frequency filter of 70Hz  and a low frequency filter of 1Hz . EEG data were recorded continuously and digitally stored. Description: EEG showed continued generalized background suppression.  EEG was not reactive to noxious stimulation.  Hyperventilation and photic stimulation were not performed.   EKG artifact was seen throughout the EEG. ABNORMALITY -Background suppression, generalized IMPRESSION: This study is suggestive of profound diffuse encephalopathy, nonspecific etiology but likely related to diffuse anoxic/hypoxic brain injury.  No seizures or epileptiform discharges were seen throughout the recording. Lora Havens   CT HEAD WO CONTRAST  Result Date: Jun 21, 2020 CLINICAL DATA:  Cerebral hemorrhage suspected. EXAM: CT HEAD WITHOUT CONTRAST TECHNIQUE: Contiguous axial images were obtained from the base of the skull through the vertex without intravenous contrast. COMPARISON:  04/19/2018 head CT and prior. FINDINGS: Brain: No acute infarct or intracranial hemorrhage. No mass lesion. No midline shift, ventriculomegaly or extra-axial fluid collection. Cavum septum pellucidum et vergae. Mild diffuse cerebral atrophy with ex vacuo dilatation. Chronic microvascular ischemic changes. Vascular: No hyperdense vessel or unexpected calcification. Bilateral skull  base atherosclerotic calcifications. Skull: No acute finding. Sinuses/Orbits: Normal orbits. Clear  paranasal sinuses. No mastoid effusion. Other: None. IMPRESSION: No acute intracranial process. Mild cerebral atrophy and chronic microvascular ischemic changes. Electronically Signed   By: Primitivo Gauze M.D.   On: 06/13/2020 09:22   DG Chest Port 1 View  Result Date: 06/19/2020 CLINICAL DATA:  Shortness of breath EXAM: PORTABLE CHEST 1 VIEW COMPARISON:  06/13/2020 FINDINGS: Postoperative changes in the mediastinum. Endotracheal and enteric tubes are unchanged in position. Cardiac enlargement. Bilateral pleural effusions, greater on the left. Perihilar infiltrates, likely representing edema. Infiltrates and effusions are progressing since previous study. IMPRESSION: Cardiac enlargement with bilateral pleural effusions and perihilar edema, progressing since previous study. Electronically Signed   By: Lucienne Capers M.D.   On: 06/19/2020 05:22   DG Chest Port 1 View  Result Date: 06/02/2020 CLINICAL DATA:  Cardiac arrest and resuscitation EXAM: PORTABLE CHEST 1 VIEW COMPARISON:  12/28/2019 FINDINGS: Postoperative changes in the mediastinum with sternotomy wires, vascular markers, and skin clips present. Endotracheal tube with tip measuring 3.4 cm above the carina. Enteric tube tip is below the left hemidiaphragm. Shallow inspiration with linear atelectasis in the lung bases. Small left pleural effusion. Cardiac enlargement. IMPRESSION: Appliances appear in satisfactory location. Shallow inspiration with atelectasis in the lung bases and small left pleural effusion. Electronically Signed   By: Lucienne Capers M.D.   On: 06/01/2020 06:46    Microbiology Recent Results (from the past 240 hour(s))  Resp Panel by RT PCR (RSV, Flu A&B, Covid) - Nasopharyngeal Swab     Status: None   Collection Time: 06/04/2020  6:55 AM   Specimen: Nasopharyngeal Swab  Result Value Ref Range Status   SARS Coronavirus 2  by RT PCR NEGATIVE NEGATIVE Final    Comment: (NOTE) SARS-CoV-2 target nucleic acids are NOT DETECTED.  The SARS-CoV-2 RNA is generally detectable in upper respiratoy specimens during the acute phase of infection. The lowest concentration of SARS-CoV-2 viral copies this assay can detect is 131 copies/mL. A negative result does not preclude SARS-Cov-2 infection and should not be used as the sole basis for treatment or other patient management decisions. A negative result may occur with  improper specimen collection/handling, submission of specimen other than nasopharyngeal swab, presence of viral mutation(s) within the areas targeted by this assay, and inadequate number of viral copies (<131 copies/mL). A negative result must be combined with clinical observations, patient history, and epidemiological information. The expected result is Negative.  Fact Sheet for Patients:  PinkCheek.be  Fact Sheet for Healthcare Providers:  GravelBags.it  This test is no t yet approved or cleared by the [place] FDA and  has been authorized for detection and/or diagnosis of SARS-CoV-2 by FDA under an Emergency Use Authorization (EUA). This EUA will remain  in effect (meaning this test can be used) for the duration of the COVID-19 declaration under Section 564(b)(1) of the Act, 21 U.S.C. section 360bbb-3(b)(1), unless the authorization is terminated or revoked sooner.     Influenza A by PCR NEGATIVE NEGATIVE Final   Influenza B by PCR NEGATIVE NEGATIVE Final    Comment: (NOTE) The Xpert Xpress SARS-CoV-2/FLU/RSV assay is intended as an aid in  the diagnosis of influenza from Nasopharyngeal swab specimens and  should not be used as a sole basis for treatment. Nasal washings and  aspirates are unacceptable for Xpert Xpress SARS-CoV-2/FLU/RSV  testing.  Fact Sheet for Patients: PinkCheek.be  Fact Sheet  for Healthcare Providers: GravelBags.it  This test is not yet approved or cleared by the [place] and  has been authorized for detection and/or diagnosis of SARS-CoV-2 by  FDA under an Emergency Use Authorization (EUA). This EUA will remain  in effect (meaning this test can be used) for the duration of the  Covid-19 declaration under Section 564(b)(1) of the Act, 21  U.S.C. section 360bbb-3(b)(1), unless the authorization is  terminated or revoked.    Respiratory Syncytial Virus by PCR NEGATIVE NEGATIVE Final    Comment: (NOTE) Fact Sheet for Patients: PinkCheek.be  Fact Sheet for Healthcare Providers: GravelBags.it  This test is not yet approved or cleared by the [place] FDA and  has been authorized for detection and/or diagnosis of SARS-CoV-2 by  FDA under an Emergency Use Authorization (EUA). This EUA will remain  in effect (meaning this test can be used) for the duration of the  COVID-19 declaration under Section 564(b)(1) of the Act, 21 U.S.C.  section 360bbb-3(b)(1), unless the authorization is terminated or  revoked. Performed at [facility], [address] [place], [place] [address]   MRSA PCR Screening     Status: None   Collection Time: 06/15/2020  9:46 AM   Specimen: Nasopharyngeal  Result Value Ref Range Status   MRSA by PCR NEGATIVE NEGATIVE Final    Comment:        The GeneXpert MRSA Assay (FDA approved for NASAL specimens only), is one component of a comprehensive MRSA colonization surveillance program. It is not intended to diagnose MRSA infection nor to guide or monitor treatment for MRSA infections. Performed at [facility], [address] [place], [place] [address]     Lab Basic Metabolic Panel: Recent Labs  Lab 06/29/2020 [phone] 06/06/2020 1043 06/19/20 0416 06/19/20 1047 Jul 08, 2020 0420  NA 141  --  140  --  143  K 4.4  --   6.1* 5.9* 4.8  CL 93*  --  99  --  106  CO2 28  --  28  --  25  GLUCOSE 389*  --  262*  --  374*  BUN 48*  --  59*  --  60*  CREATININE 3.31* 3.12* 3.99*  --  3.09*  CALCIUM 8.8*  --  7.6*  --  7.4*  MG  --   --  2.2  --  2.2  PHOS  --   --  3.5  --  2.6   Liver Function Tests: No results for input(s): AST, ALT, ALKPHOS, BILITOT, PROT, ALBUMIN in the last 168 hours. No results for input(s): LIPASE, AMYLASE in the last 168 hours. No results for input(s): AMMONIA in the last 168 hours. CBC: Recent Labs  Lab 06/23/2020 0623 06/19/20 0416 2020-07-08 0420  WBC 13.3* 15.2* 11.6*  HGB 11.4* 11.5* 10.6*  HCT 39.5 37.6* 32.8*  MCV 104.5* 97.4 94.8  PLT 290 284 190   Cardiac Enzymes: No results for input(s): CKTOTAL, CKMB, CKMBINDEX, TROPONINI in the last 168 hours. Sepsis Labs: Recent Labs  Lab 06/27/2020 0623 06/19/20 0416 07/08/20 0420  WBC 13.3* 15.2* 11.6*      Jullian Previti 07/08/2020, 6:55 PM

## 2020-07-01 NOTE — Progress Notes (Addendum)
   June 29, 2020 1500  Clinical Encounter Type  Visited With Patient;Family  Visit Type Follow-up;Social support;Critical Care  Referral From Nurse  Consult/Referral To Chaplain  Chaplain responded to PG. When she arrived at the unit, Pt's nurse explained family made decision to extubate Pt, but before doing so they wanted a chaplain to pray. Chaplain went in the room to meet with family and they requested her to pray for Pt. She told them that she would pray for them as well. After chaplain finished praying, family members thanked her for praying and said Pt is a [place] and they know where he is going. Chaplain told them of chaplain's availability and left.

## 2020-07-01 NOTE — Progress Notes (Signed)
Patient pronounced death at [facility], pt assessed by 2 RNs, no pupil reaction, no BP, no spontaneous breathing, no heart sounds, asystole on the monitor. Family at bedside during death. Pt was comfort care prior extubation. Dr. Mortimer Fries at bedside.

## 2020-07-01 NOTE — Progress Notes (Addendum)
   2020-07-17 1700  Clinical Encounter Type  Visited With Patient;Family  Visit Type Follow-up;Spiritual support;Critical Care  Referral From Nurse  Consult/Referral To Chaplain  Chaplain responded to PG. When chaplain returned call, she was told family requested for her to come and read scriptures before, Pt is extubated. As chaplain entered the room, the family thanked her for coming so soon. Chaplain read various scriptures. After reading scriptures chaplain prayed. Afterwards chaplain shared how she is making it through dealing with the recent death of her best friend. Chaplain encouraged them to think of something funny Pt said or did that made them laugh and allow that to bring a smile to their faces. Chaplain shared another story and everyone in the room laughed. Family thanked chaplain again for coming and chaplain left the room.

## 2020-07-01 NOTE — Progress Notes (Signed)
GOALS OF CARE DISCUSSION  The Clinical status was relayed to family in detail-Daughters at bedside.  Updated and notified of patients medical condition.  Patient remains unresponsive and will not open eyes to command.   Patient showing signs of brain death- Explained to family course of therapy and the modalities     Patient with Progressive multiorgan failure with very low chance of meaningful recovery despite all aggressive and optimal medical therapy. Patient is in the Dying  Process associated with Suffering.  Family understands the situation.  They have consented and agreed to DNR/DNI and would like to proceed with Comfort care measures.  Family are satisfied with Plan of action and management. All questions answered  Additional CC time 32 mins   Roye Gustafson Patricia Pesa, M.D.  [facility]  Medical Director [place] Director [facility] Cardio-Pulmonary Department

## 2020-07-01 NOTE — Progress Notes (Signed)
eeg done °

## 2020-07-01 NOTE — Progress Notes (Signed)
Pt was suctioned prior to extubation. He was extubated and placed on comfort care.

## 2020-07-01 NOTE — Progress Notes (Addendum)
   Jun 25, 2020 1820  Clinical Encounter Type  Visited With Patient and family together  Visit Type Follow-up;Spiritual support;Death  Referral From Nurse  Consult/Referral To Chaplain  While standing outside of room 2, chaplain was keeping an eye on Michael Wright's room. Family was at bedside waiting for Pt to be extubated. As chaplain stood by the door, respiratory therapist came in an extubated him. Chaplain quietly stood by and after Pt had transitioned, chaplain went back in to comfort the family. Daughter were crying and chaplain rubbed their backs as needed. Chaplain stepped back out. While standing at the nurse's station, one of the daughters asked chaplain to come in and pray with them again. After praying with them, chaplain hugged the daughters and stepped out. Before stepping out chaplain inform one daughter that they would need to complete a form. Chaplain gave daughter the form and after completing the form she told chaplain "thank you for your words. They were perfect." Chaplain hugged her again and told her husband to hold on to her tonight and she left.

## 2020-07-01 NOTE — Procedures (Signed)
Patient Name: Michael Wright  MRN: [id number]  Epilepsy Attending: Lora Havens  Referring Physician/Provider:  Dr Flora Lipps Date: 2020-07-04 Duration: 31.18 mins  Patient history: 79 year old male status post cardiac arrest.  EEG to evaluate for seizures.  Level of alertness: Comatose  AEDs during EEG study: None  Technical aspects: This EEG study was done with scalp electrodes positioned according to the 10-20 International system of electrode placement. Electrical activity was acquired at a sampling rate of 500Hz  and reviewed with a high frequency filter of 70Hz  and a low frequency filter of 1Hz . EEG data were recorded continuously and digitally stored.   Description: EEG showed continued generalized background suppression.  EEG was not reactive to noxious stimulation.  Hyperventilation and photic stimulation were not performed.     EKG artifact was seen throughout the EEG.  ABNORMALITY -Background suppression, generalized  IMPRESSION: This study is suggestive of profound diffuse encephalopathy, nonspecific etiology but likely related to diffuse anoxic/hypoxic brain injury.  No seizures or epileptiform discharges were seen throughout the recording.  [name] Barbra Sarks

## 2020-07-01 DEATH — deceased

## 2020-07-11 ENCOUNTER — Ambulatory Visit: Payer: Self-pay

## 2020-08-16 ENCOUNTER — Ambulatory Visit: Payer: Medicare HMO | Admitting: Oncology

## 2020-08-16 ENCOUNTER — Other Ambulatory Visit: Payer: Medicare HMO

## 2021-07-14 IMAGING — DX DG CHEST 1V PORT
1 series · 1 of 1 positions shown · non-contrast
Comparison: 12/28/2019

CLINICAL DATA: Cardiac arrest and resuscitation

EXAM:
PORTABLE CHEST 1 VIEW

[chest ap]
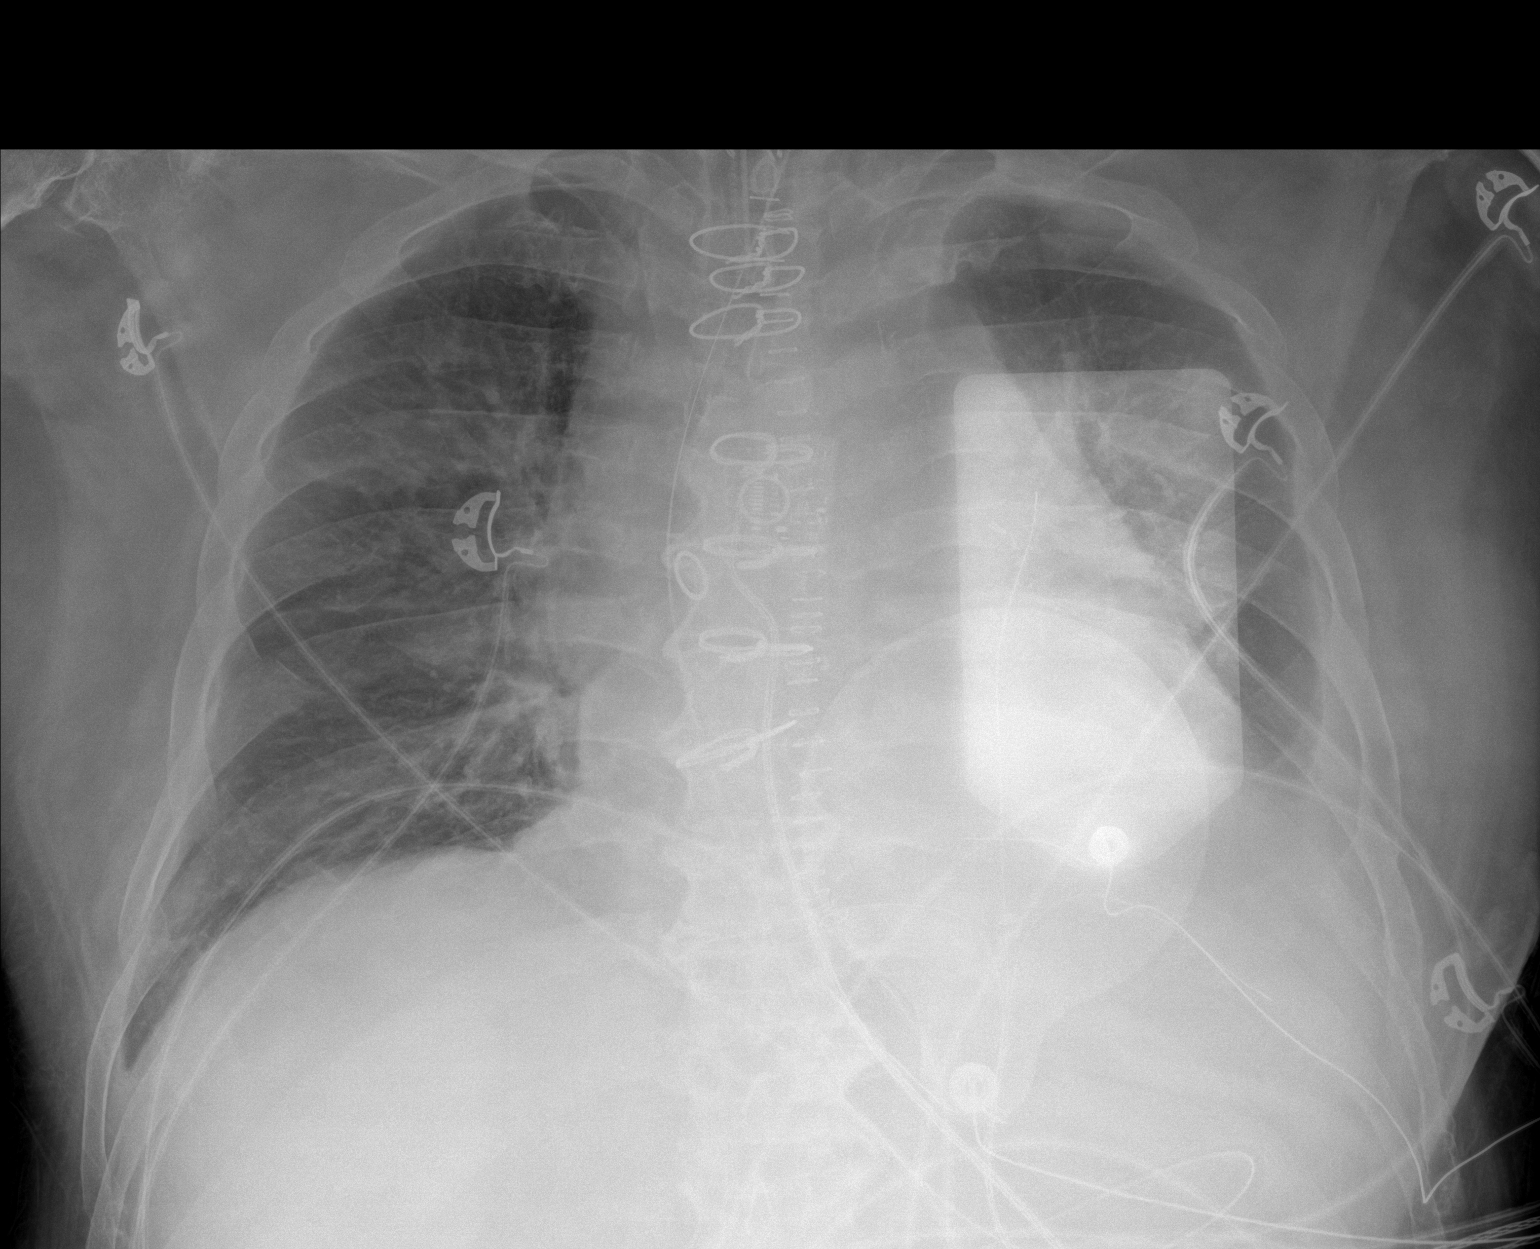

[1 of 1 positions shown; findings below may reference images not displayed]

FINDINGS: Postoperative changes in the mediastinum with sternotomy wires,
vascular markers, and skin clips present. Endotracheal tube with tip
measuring 3.4 cm above the carina. Enteric tube tip is below the
left hemidiaphragm. Shallow inspiration with linear atelectasis in
the lung bases. Small left pleural effusion. Cardiac enlargement.
IMPRESSION: Appliances appear in satisfactory location. Shallow inspiration with
atelectasis in the lung bases and small left pleural effusion.

## 2021-07-14 IMAGING — CT CT HEAD W/O CM
3 series · 15 of 47 positions shown, 18 images · non-contrast
Comparison: 04/19/2018 head CT and prior.

CLINICAL DATA: Cerebral hemorrhage suspected.

EXAM:
CT HEAD WITHOUT CONTRAST
TECHNIQUE: Contiguous axial images were obtained from the base of the skull
through the vertex without intravenous contrast.

[Series 2: head wo · axial · 0.43mm/px · z∈[-127,+3]mm · 9 of 32 slices shown, 12 images]
[im 3/32  brain]
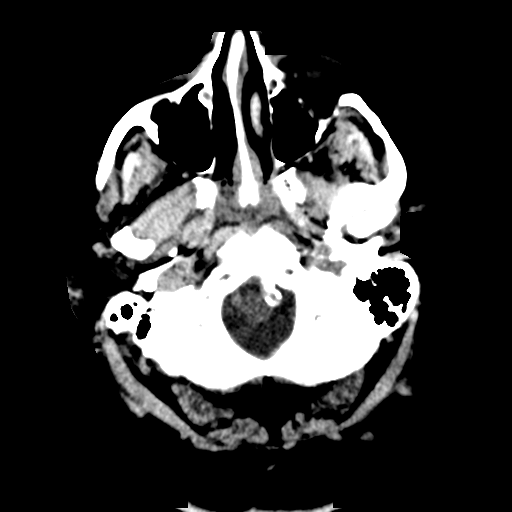
[im 3/32  bone]
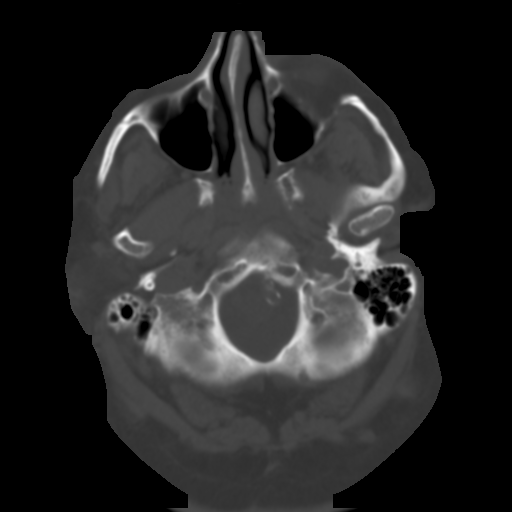
[im 6/32  brain]
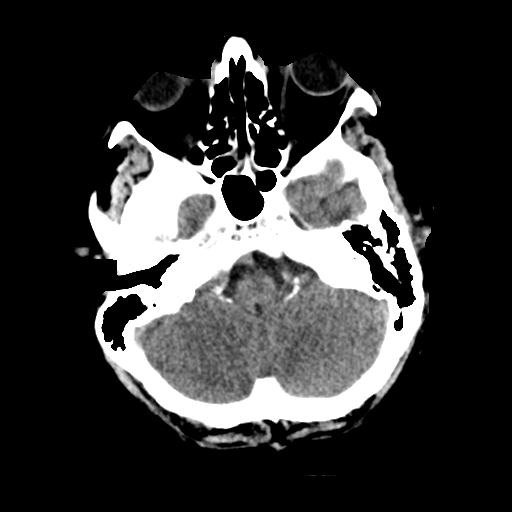
[im 9/32  brain]
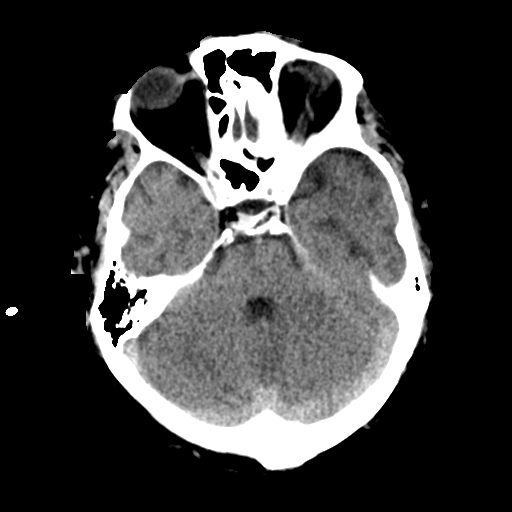
[im 12/32  brain]
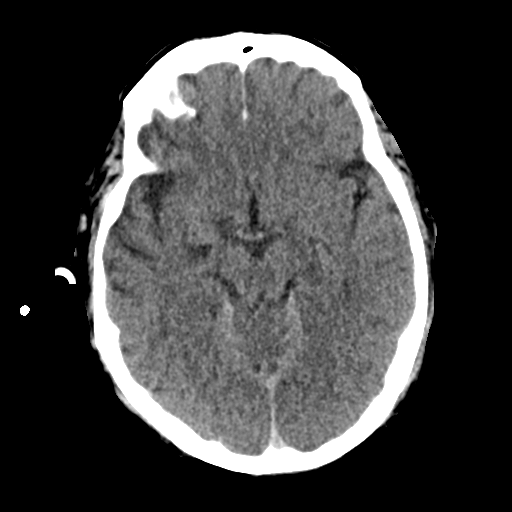
[im 17/32  brain]
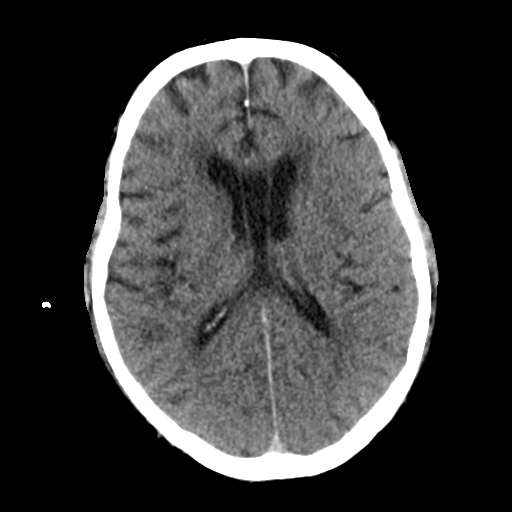
[im 17/32  bone]
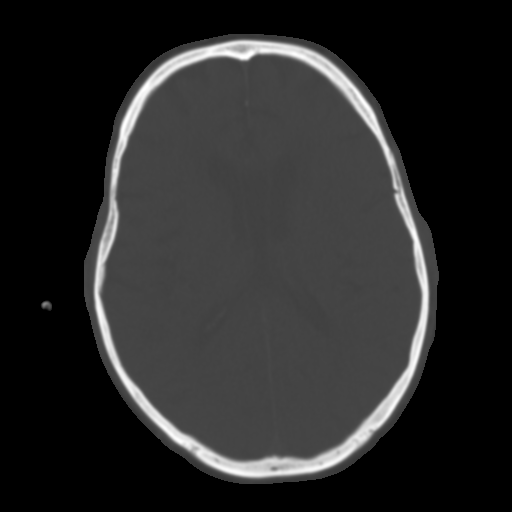
[im 20/32  brain]
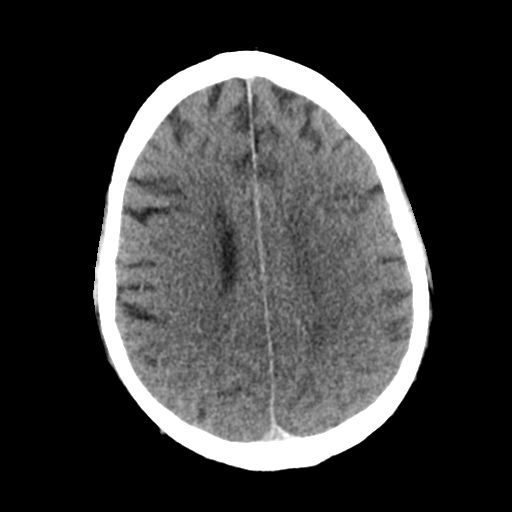
[im 23/32  brain]
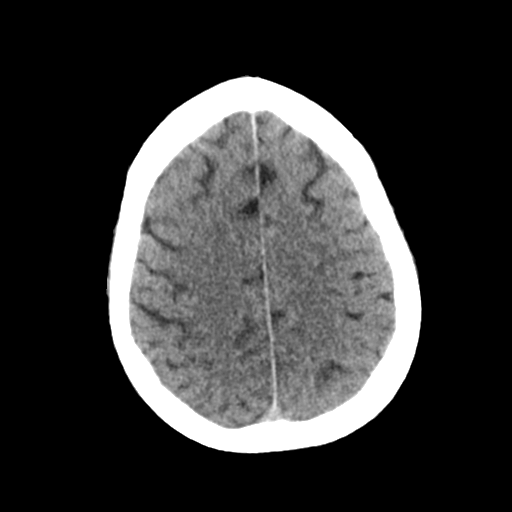
[im 26/32  brain]
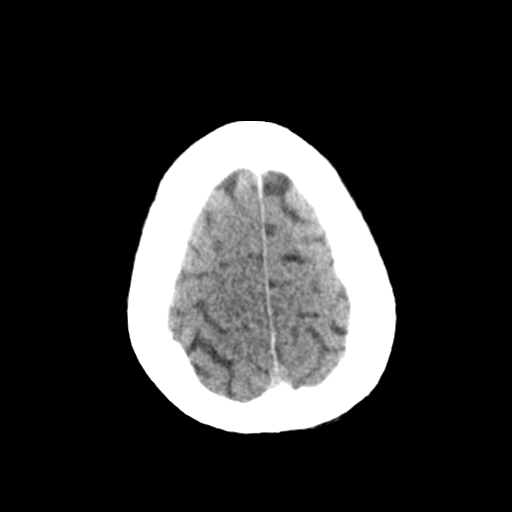
[im 29/32  brain]
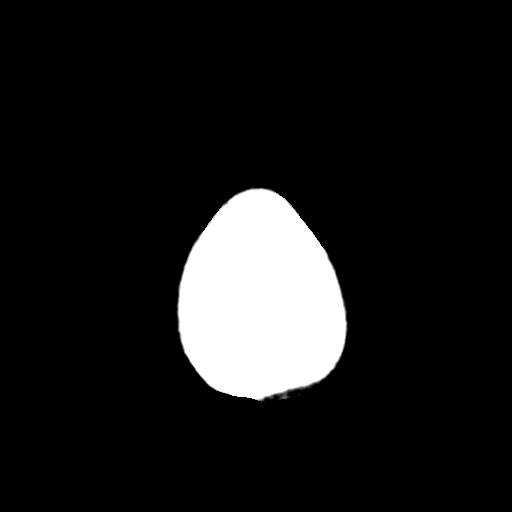
[im 29/32  bone]
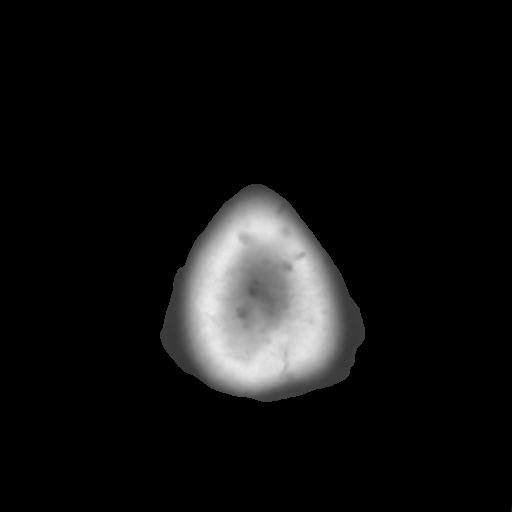

[Series 4: coronal soft tissue · coronal · 0.29mm/px · 3 of 69 slices shown]
[im 23/69  brain]
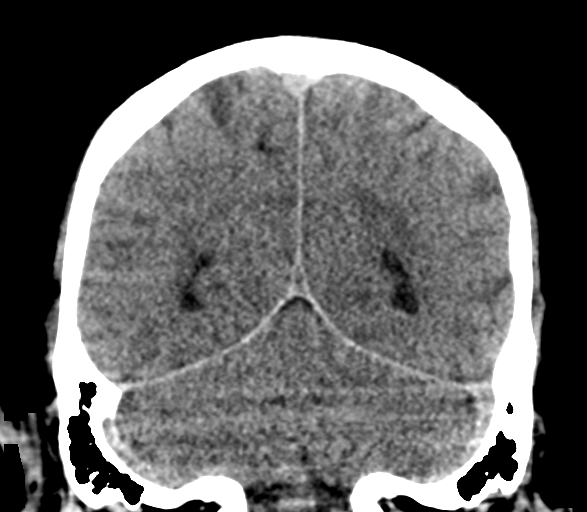
[im 31/69  brain]
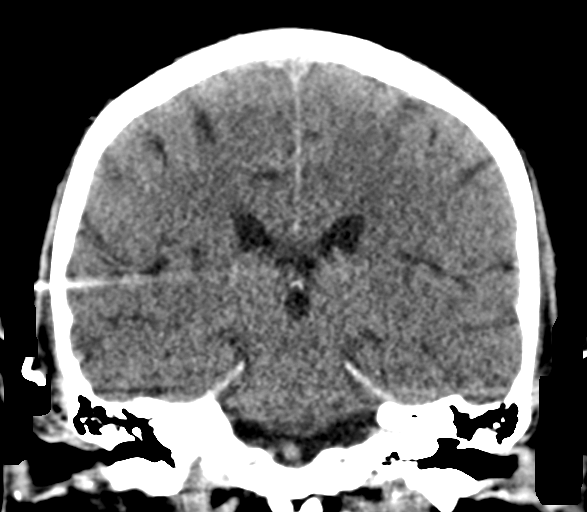
[im 38/69  brain]
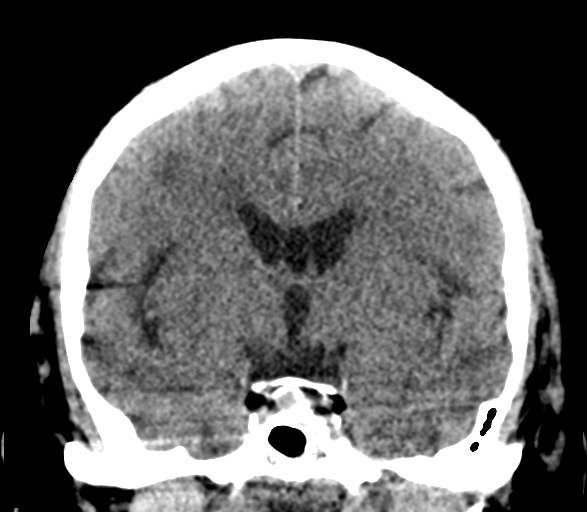

[Series 5: sagittal soft tissue · sagittal · 0.29mm/px · 3 of 57 slices shown]
[im 19/57  brain]
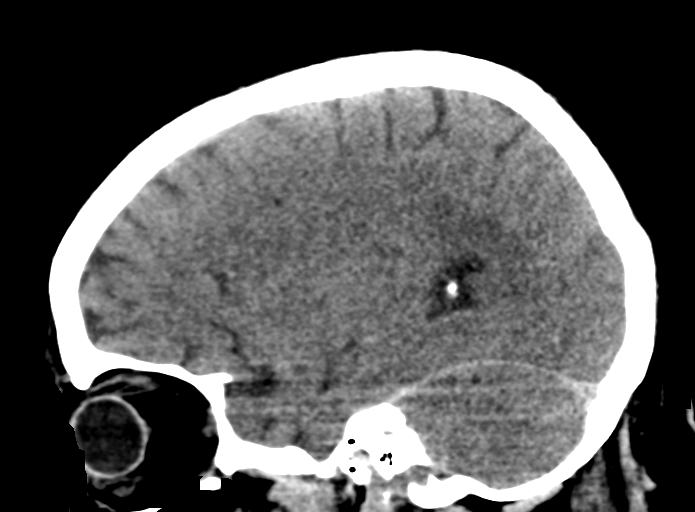
[im 29/57  brain]
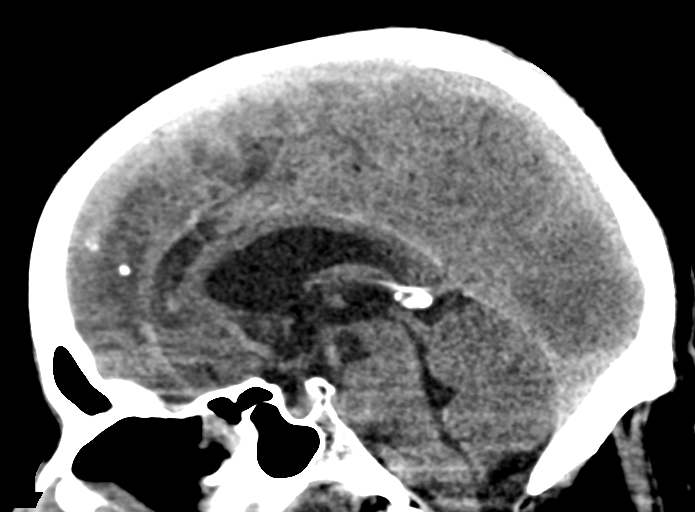
[im 38/57  brain]
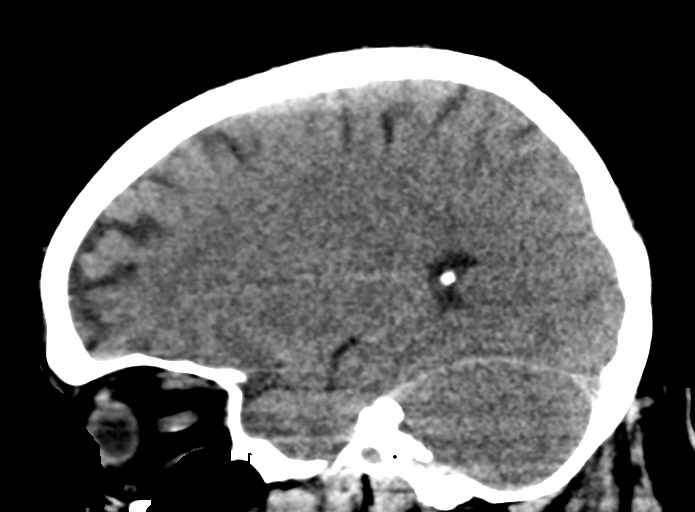

[15 of 47 positions shown; findings below may reference images not displayed]

FINDINGS: Brain: No acute infarct or intracranial hemorrhage. No mass lesion.
No midline shift, ventriculomegaly or extra-axial fluid collection.
Cavum septum pellucidum et vergae. Mild diffuse cerebral atrophy
with ex vacuo dilatation. Chronic microvascular ischemic changes.

Vascular: No hyperdense vessel or unexpected calcification.
Bilateral skull base atherosclerotic calcifications.

Skull: No acute finding.

Sinuses/Orbits: Normal orbits. Clear paranasal sinuses. No mastoid
effusion.

Other: None.
IMPRESSION: No acute intracranial process.

Mild cerebral atrophy and chronic microvascular ischemic changes.
